# Patient Record
Sex: Female | Born: 1970 | Race: White | Hispanic: No | Marital: Married | Smoking: Former smoker
Health system: Southern US, Community
[De-identification: ages and names within clinical notes are randomized; demographics above are authoritative.]

## PROBLEM LIST (undated history)

## (undated) DIAGNOSIS — C541 Malignant neoplasm of endometrium: Secondary | ICD-10-CM

## (undated) DIAGNOSIS — R001 Bradycardia, unspecified: Secondary | ICD-10-CM

## (undated) DIAGNOSIS — J45909 Unspecified asthma, uncomplicated: Secondary | ICD-10-CM

## (undated) DIAGNOSIS — I480 Paroxysmal atrial fibrillation: Secondary | ICD-10-CM

## (undated) DIAGNOSIS — M1990 Unspecified osteoarthritis, unspecified site: Secondary | ICD-10-CM

## (undated) DIAGNOSIS — R011 Cardiac murmur, unspecified: Secondary | ICD-10-CM

## (undated) DIAGNOSIS — C801 Malignant (primary) neoplasm, unspecified: Secondary | ICD-10-CM

## (undated) DIAGNOSIS — F329 Major depressive disorder, single episode, unspecified: Secondary | ICD-10-CM

## (undated) DIAGNOSIS — F32A Depression, unspecified: Secondary | ICD-10-CM

## (undated) DIAGNOSIS — I10 Essential (primary) hypertension: Secondary | ICD-10-CM

## (undated) DIAGNOSIS — I499 Cardiac arrhythmia, unspecified: Secondary | ICD-10-CM

## (undated) DIAGNOSIS — I350 Nonrheumatic aortic (valve) stenosis: Secondary | ICD-10-CM

## (undated) DIAGNOSIS — Z923 Personal history of irradiation: Secondary | ICD-10-CM

## (undated) DIAGNOSIS — K219 Gastro-esophageal reflux disease without esophagitis: Secondary | ICD-10-CM

## (undated) DIAGNOSIS — E119 Type 2 diabetes mellitus without complications: Secondary | ICD-10-CM

## (undated) DIAGNOSIS — I272 Pulmonary hypertension, unspecified: Secondary | ICD-10-CM

## (undated) DIAGNOSIS — G4730 Sleep apnea, unspecified: Secondary | ICD-10-CM

## (undated) DIAGNOSIS — E7800 Pure hypercholesterolemia, unspecified: Secondary | ICD-10-CM

## (undated) DIAGNOSIS — D649 Anemia, unspecified: Secondary | ICD-10-CM

## (undated) DIAGNOSIS — F419 Anxiety disorder, unspecified: Secondary | ICD-10-CM

## (undated) HISTORY — DX: Pulmonary hypertension, unspecified: I27.20

## (undated) HISTORY — DX: Nonrheumatic aortic (valve) stenosis: I35.0

## (undated) HISTORY — PX: COLONOSCOPY: SHX174

## (undated) HISTORY — PX: TUBAL LIGATION: SHX77

## (undated) HISTORY — DX: Paroxysmal atrial fibrillation: I48.0

---

## 1998-02-28 HISTORY — PX: TUBAL LIGATION: SHX77

## 2004-05-07 ENCOUNTER — Emergency Department: Payer: Self-pay | Admitting: Emergency Medicine

## 2004-06-27 ENCOUNTER — Emergency Department: Payer: Self-pay | Admitting: Emergency Medicine

## 2004-10-14 ENCOUNTER — Other Ambulatory Visit: Payer: Self-pay

## 2004-10-14 ENCOUNTER — Emergency Department: Payer: Self-pay | Admitting: Unknown Physician Specialty

## 2004-10-30 ENCOUNTER — Emergency Department: Payer: Self-pay | Admitting: General Practice

## 2004-11-06 ENCOUNTER — Emergency Department: Payer: Self-pay | Admitting: Emergency Medicine

## 2005-01-26 ENCOUNTER — Emergency Department: Payer: Self-pay | Admitting: Emergency Medicine

## 2005-07-21 ENCOUNTER — Emergency Department: Payer: Self-pay | Admitting: Unknown Physician Specialty

## 2005-08-10 ENCOUNTER — Ambulatory Visit: Payer: Self-pay | Admitting: Family Medicine

## 2005-09-01 ENCOUNTER — Emergency Department: Payer: Self-pay | Admitting: Internal Medicine

## 2006-01-18 ENCOUNTER — Ambulatory Visit: Payer: Self-pay | Admitting: Gastroenterology

## 2006-08-18 ENCOUNTER — Emergency Department: Payer: Self-pay

## 2007-04-21 DIAGNOSIS — I10 Essential (primary) hypertension: Secondary | ICD-10-CM | POA: Insufficient documentation

## 2007-05-14 DIAGNOSIS — J309 Allergic rhinitis, unspecified: Secondary | ICD-10-CM | POA: Insufficient documentation

## 2007-10-26 ENCOUNTER — Other Ambulatory Visit: Payer: Self-pay

## 2007-10-26 ENCOUNTER — Emergency Department: Payer: Self-pay | Admitting: Emergency Medicine

## 2008-09-19 ENCOUNTER — Emergency Department: Payer: Self-pay | Admitting: Emergency Medicine

## 2008-10-09 ENCOUNTER — Ambulatory Visit: Payer: Self-pay | Admitting: Internal Medicine

## 2009-06-30 ENCOUNTER — Emergency Department: Payer: Self-pay | Admitting: Emergency Medicine

## 2011-06-17 DIAGNOSIS — E1121 Type 2 diabetes mellitus with diabetic nephropathy: Secondary | ICD-10-CM | POA: Insufficient documentation

## 2011-06-24 DIAGNOSIS — F39 Unspecified mood [affective] disorder: Secondary | ICD-10-CM | POA: Insufficient documentation

## 2011-08-17 DIAGNOSIS — L209 Atopic dermatitis, unspecified: Secondary | ICD-10-CM | POA: Insufficient documentation

## 2011-12-19 DIAGNOSIS — K219 Gastro-esophageal reflux disease without esophagitis: Secondary | ICD-10-CM | POA: Insufficient documentation

## 2012-04-20 DIAGNOSIS — Z0000 Encounter for general adult medical examination without abnormal findings: Secondary | ICD-10-CM | POA: Insufficient documentation

## 2012-12-08 ENCOUNTER — Ambulatory Visit: Payer: Self-pay | Admitting: Family Medicine

## 2012-12-17 DIAGNOSIS — R0789 Other chest pain: Secondary | ICD-10-CM | POA: Insufficient documentation

## 2013-01-19 ENCOUNTER — Ambulatory Visit: Payer: Self-pay | Admitting: Family Medicine

## 2013-12-09 DIAGNOSIS — E119 Type 2 diabetes mellitus without complications: Secondary | ICD-10-CM | POA: Insufficient documentation

## 2013-12-09 DIAGNOSIS — J45909 Unspecified asthma, uncomplicated: Secondary | ICD-10-CM | POA: Insufficient documentation

## 2013-12-09 DIAGNOSIS — K219 Gastro-esophageal reflux disease without esophagitis: Secondary | ICD-10-CM | POA: Insufficient documentation

## 2013-12-11 DIAGNOSIS — G4733 Obstructive sleep apnea (adult) (pediatric): Secondary | ICD-10-CM | POA: Insufficient documentation

## 2013-12-11 DIAGNOSIS — E782 Mixed hyperlipidemia: Secondary | ICD-10-CM | POA: Insufficient documentation

## 2014-12-02 DIAGNOSIS — I10 Essential (primary) hypertension: Secondary | ICD-10-CM | POA: Insufficient documentation

## 2015-10-13 DIAGNOSIS — Z9851 Tubal ligation status: Secondary | ICD-10-CM | POA: Insufficient documentation

## 2016-10-18 DIAGNOSIS — F439 Reaction to severe stress, unspecified: Secondary | ICD-10-CM | POA: Insufficient documentation

## 2016-12-19 DIAGNOSIS — R0602 Shortness of breath: Secondary | ICD-10-CM | POA: Insufficient documentation

## 2017-07-09 ENCOUNTER — Encounter: Payer: Self-pay | Admitting: Gynecology

## 2017-07-09 ENCOUNTER — Other Ambulatory Visit: Payer: Self-pay

## 2017-07-09 ENCOUNTER — Ambulatory Visit
Admission: EM | Admit: 2017-07-09 | Discharge: 2017-07-09 | Disposition: A | Discharge status: Home / Self Care | Payer: Medicare Other | Attending: Emergency Medicine | Admitting: Emergency Medicine

## 2017-07-09 DIAGNOSIS — R61 Generalized hyperhidrosis: Secondary | ICD-10-CM | POA: Diagnosis not present

## 2017-07-09 DIAGNOSIS — Z0000 Encounter for general adult medical examination without abnormal findings: Secondary | ICD-10-CM

## 2017-07-09 HISTORY — DX: Unspecified asthma, uncomplicated: J45.909

## 2017-07-09 HISTORY — DX: Pure hypercholesterolemia, unspecified: E78.00

## 2017-07-09 HISTORY — DX: Anemia, unspecified: D64.9

## 2017-07-09 HISTORY — DX: Depression, unspecified: F32.A

## 2017-07-09 HISTORY — DX: Gastro-esophageal reflux disease without esophagitis: K21.9

## 2017-07-09 HISTORY — DX: Major depressive disorder, single episode, unspecified: F32.9

## 2017-07-09 HISTORY — DX: Essential (primary) hypertension: I10

## 2017-07-09 HISTORY — DX: Type 2 diabetes mellitus without complications: E11.9

## 2017-07-09 HISTORY — DX: Anxiety disorder, unspecified: F41.9

## 2017-07-09 NOTE — ED Triage Notes (Signed)
Per patient history of high blood pressure. Pt. Stated woke up this morning sweating and concern about her blood pressure. Per patient did not take blood pressure measurement or check for fever at home.

## 2017-07-09 NOTE — ED Provider Notes (Signed)
HPI  SUBJECTIVE:  Joan Graves is a 47 y.o. female who presents with concerns for possible high blood pressure.  Patient states that she woke up this morning diaphoretic and that sheets were "wet".  She was worried that it could be a blood pressure or her diabetes.  She checked her blood sugar and it was 165, which is within normal limits for her.  She has been in her usual state of health up until today.  She has no complaints.  No chest pain, shortness of breath, nausea, abdominal pain, headache, strokelike symptoms.  No lower extremity edema, unintentional weight loss, coughing, wheezing, shortness of breath, anuria, hematuria.  She denies bundling.  She denies any other menopause symptoms.  Family history: Unknown.  She states that her mother died before having menopause.  This is never happened before.  No aggravating or alleviating factors.  She has not tried anything for this.  She has a past medical history of diabetes, hypertension, she does not have a home blood pressure cuff.  She states that she is compliant with her enalapril, amlodipine.  Also hypercholesterolemia, mild aortic stenosis, asthma, anxiety.  History of stroke, MI, kidney disease.  LMP: Started this morning.  She is status post bilateral tubal ligation.  PMD: [facility]   Last 3 blood pressure readings range from 112-140/66-80.    Past Medical History:  Diagnosis Date  . Anemia   . Anxiety   . Asthma   . Depression   . Diabetes mellitus without complication ([place])   . GERD (gastroesophageal reflux disease)   . Hypercholesteremia   . Hypertension     Past Surgical History:  Procedure Laterality Date  . TUBAL LIGATION      No family history on file.  Social History   Tobacco Use  . Smoking status: Never Smoker  . Smokeless tobacco: Never Used  Substance Use Topics  . Alcohol use: Never    Frequency: Never  . Drug use: Never    No current facility-administered medications for  this encounter.   Current Outpatient Medications:  .  ACCU-CHEK SOFTCLIX LANCETS lancets, CHECK GLUCOSE TWICE DIALY FOR GLUCOSE MONITORING E11.9, Disp: , Rfl: 5 .  ADVAIR DISKUS 500-50 MCG/DOSE AEPB, 1 DOSE INHALED TWICE DAILY FOR ASTHMA, RINSE MOUTH OUT WITH WATER AFTE USE, Disp: , Rfl: 5 .  albuterol (PROVENTIL HFA) 108 (90 Base) MCG/ACT inhaler, Inhale into the lungs., Disp: , Rfl:  .  amLODipine (NORVASC) 10 MG tablet, Take by mouth., Disp: , Rfl:  .  atorvastatin (LIPITOR) 20 MG tablet, Take by mouth., Disp: , Rfl:  .  busPIRone (BUSPAR) 10 MG tablet, TAKE 1 PO DAILY TWICE A DAY, Disp: , Rfl:  .  enalapril (VASOTEC) 20 MG tablet, Take by mouth., Disp: , Rfl:  .  ferrous sulfate 325 (65 FE) MG EC tablet, 2 tabletsBY MOUTH  A DAY FOR ANEMIA, Disp: , Rfl:  .  glipiZIDE (GLUCOTROL XL) 10 MG 24 hr tablet, Take only tablet by mouth daily for diabetes, Disp: , Rfl:  .  glucose blood test strip, USE AS DIRECTED TWICE A DAY, Disp: , Rfl:  .  metFORMIN (GLUCOPHAGE-XR) 500 MG 24 hr tablet, 2 BY MOUTH TWICE DAILY FOR DIABETES, Disp: , Rfl: 1 .  omeprazole (PRILOSEC) 40 MG capsule, Take by mouth., Disp: , Rfl:  .  sertraline (ZOLOFT) 50 MG tablet, Take by mouth., Disp: , Rfl:  .  clotrimazole (LOTRIMIN) 1 % cream, APPLY TO AFFECTED AREA  TWICE A DAY FOR FUNGAL INFECTION, Disp: , Rfl: 0 .  escitalopram (LEXAPRO) 5 MG tablet, TAKE 1 TABLET BY MOUTH EVERY DAY IN THE MORNING, Disp: , Rfl: 0  Allergies  Allergen Reactions  . Aspirin Diarrhea     ROS  As noted in HPI.   Physical Exam  BP (!) 143/74 (BP Location: Left Arm)   Pulse 67   Temp 98 F (36.7 C) (Oral)   Resp 18   Ht 5\' 4"  (1.626 m)   Wt (!) 370 lb (167.8 kg)   LMP 07/09/2017   SpO2 98%   BMI 63.51 kg/m   Constitutional: Well developed, well nourished, no acute distress Eyes:  EOMI, conjunctiva normal bilaterally HENT: Normocephalic, atraumatic,mucus membranes moist Respiratory: Normal inspiratory effort good air movement,  lungs clear bilaterally Cardiovascular: Normal rate and rhythm, positive murmur, no rub no gallop GI: nondistended soft.  Active bowel sounds.  No suprapubic or flank tenderness  Back: No CVA tenderness skin: No rash, skin intact Musculoskeletal: no deformities  trace edema bilateral lower extremities calves symmetric. Neurologic: Alert & oriented x 3, no focal neuro deficits Psychiatric: Speech and behavior appropriate   ED Course   Medications - No data to display  No orders of the defined types were placed in this encounter.   No results found for this or any previous visit (from the past 24 hour(s)). No results found.  ED Clinical Impression  Normal exam   ED Assessment/Plan  Outside medical records reviewed.  As noted in HPI.  Blood pressure acceptable today.  Patient states that her sugar was within normal limits at home today.  Unsure as the etiology of patient's symptoms, but do not think that it is an emergency right now.  Since this was a single episode this morning, will have patient keep an eye on this.  If it starts happening on a regular basis, she will need to follow-up with her primary care physician for further investigation.  Also advised patient to buy a blood pressure cuff and keep a log of it and follow-up with her primary care physician with this.  This could be the beginnings of menopause.  We will write a work note for today.  Discussed  MDM, treatment plan, and plan for follow-up with patient. patient agrees with plan.   No orders of the defined types were placed in this encounter.   *This clinic note was created using Dragon dictation software. Therefore, there may be occasional mistakes despite careful proofreading.   ?   Melynda Ripple, MD 07/09/17 1003

## 2017-07-09 NOTE — Discharge Instructions (Addendum)
It is important to keep your blood pressure under good control, as having a elevated blood pressure for prolonged periods of time significantly increases your risk of stroke, heart attacks, kidney damage, eye damage, and other problems. Measure your blood pressure once a day, preferably at the same time every day. Keep a log of this and bring it to your next doctor's appointment.  Bring your blood pressure cuff as well so that they can make sure that it is taking accurate readings.  Return immediately to the ER if you start having chest pain, headache, problems seeing, problems talking, problems walking, if you feel like you're about to pass out, if you do pass out, if you have a seizure, or for any other concerns.  Go to www.goodrx.com to look up your medications. This will give you a list of where you can find your prescriptions at the most affordable prices. Or ask the pharmacist what the cash price is, or if they have any other discount programs available to help make your medication more affordable. This can be less expensive than what you would pay with insurance.

## 2017-11-07 ENCOUNTER — Encounter: Payer: Self-pay | Admitting: Emergency Medicine

## 2017-11-07 ENCOUNTER — Ambulatory Visit
Admission: EM | Admit: 2017-11-07 | Discharge: 2017-11-07 | Disposition: A | Discharge status: Home / Self Care | Payer: Medicare Other | Attending: Family Medicine | Admitting: Family Medicine

## 2017-11-07 ENCOUNTER — Other Ambulatory Visit: Payer: Self-pay

## 2017-11-07 DIAGNOSIS — E119 Type 2 diabetes mellitus without complications: Secondary | ICD-10-CM | POA: Diagnosis not present

## 2017-11-07 DIAGNOSIS — K219 Gastro-esophageal reflux disease without esophagitis: Secondary | ICD-10-CM | POA: Diagnosis not present

## 2017-11-07 DIAGNOSIS — J45909 Unspecified asthma, uncomplicated: Secondary | ICD-10-CM | POA: Insufficient documentation

## 2017-11-07 DIAGNOSIS — B9789 Other viral agents as the cause of diseases classified elsewhere: Secondary | ICD-10-CM | POA: Diagnosis not present

## 2017-11-07 DIAGNOSIS — Z7984 Long term (current) use of oral hypoglycemic drugs: Secondary | ICD-10-CM | POA: Diagnosis not present

## 2017-11-07 DIAGNOSIS — J028 Acute pharyngitis due to other specified organisms: Secondary | ICD-10-CM | POA: Insufficient documentation

## 2017-11-07 DIAGNOSIS — F419 Anxiety disorder, unspecified: Secondary | ICD-10-CM | POA: Insufficient documentation

## 2017-11-07 DIAGNOSIS — E7800 Pure hypercholesterolemia, unspecified: Secondary | ICD-10-CM | POA: Diagnosis not present

## 2017-11-07 DIAGNOSIS — I10 Essential (primary) hypertension: Secondary | ICD-10-CM | POA: Insufficient documentation

## 2017-11-07 DIAGNOSIS — Z79899 Other long term (current) drug therapy: Secondary | ICD-10-CM | POA: Insufficient documentation

## 2017-11-07 DIAGNOSIS — F329 Major depressive disorder, single episode, unspecified: Secondary | ICD-10-CM | POA: Insufficient documentation

## 2017-11-07 DIAGNOSIS — Z886 Allergy status to analgesic agent status: Secondary | ICD-10-CM | POA: Diagnosis not present

## 2017-11-07 DIAGNOSIS — J029 Acute pharyngitis, unspecified: Secondary | ICD-10-CM | POA: Diagnosis present

## 2017-11-07 HISTORY — DX: Sleep apnea, unspecified: G47.30

## 2017-11-07 LAB — RAPID STREP SCREEN (MED CTR MEBANE ONLY): Streptococcus, Group A Screen (Direct): NEGATIVE

## 2017-11-07 MED ORDER — LIDOCAINE VISCOUS HCL 2 % MT SOLN: OROMUCOSAL | 0 refills | Status: DC

## 2017-11-07 MED ORDER — FLUTICASONE PROPIONATE 50 MCG/ACT NA SUSP: 2.0000 | Freq: Every day | NASAL | 0 refills | Status: DC

## 2017-11-07 NOTE — ED Triage Notes (Signed)
Patient c/o sore throat that started 1 week ago. Patient stated she has tried OTC cough medicine with no relief. Denies fever.

## 2017-11-07 NOTE — ED Provider Notes (Signed)
[facility]    CSN: [id number] Arrival date & time: 11/07/17  1054   History   Chief Complaint Chief Complaint  Patient presents with  . Sore Throat   HPI   47 year old female presents with sore throat.  Patient reports a one-week history of sore throat.  Mild cough.  No fever.  No chills.  No reported sick contacts.  She reports that she is taken over-the-counter cough medicine and "headache medicine" without resolution.  Sore throat exacerbated by swallowing.  No relieving factors.  Moderate in severity.  No other associated symptoms.  No other complaints.  PMH, Surgical Hx, Social History reviewed and updated as below. Past Medical History:  Diagnosis Date  . Anemia   . Anxiety   . Asthma   . Depression   . Diabetes mellitus without complication ([place])   . GERD (gastroesophageal reflux disease)   . Hypercholesteremia   . Hypertension   . Sleep apnea    Past Surgical History:  Procedure Laterality Date  . TUBAL LIGATION      OB History    Gravida  1   Para      Term      Preterm      AB      Living        SAB      TAB      Ectopic      Multiple      Live Births               Home Medications    Prior to Admission medications   Medication Sig Start Date End Date Taking? Authorizing Provider  ACCU-CHEK SOFTCLIX LANCETS lancets CHECK GLUCOSE TWICE DIALY FOR GLUCOSE MONITORING E11.9 06/09/17  Yes [provider]  ADVAIR DISKUS 500-50 MCG/DOSE AEPB 1 DOSE INHALED TWICE DAILY FOR ASTHMA, RINSE MOUTH OUT WITH WATER AFTE USE 04/24/17  Yes [provider]  albuterol (PROVENTIL HFA) 108 (90 Base) MCG/ACT inhaler Inhale into the lungs.   Yes [provider]  amLODipine (NORVASC) 10 MG tablet Take by mouth. 05/06/15  Yes [provider]  atorvastatin (LIPITOR) 20 MG tablet Take by mouth. 06/01/17  Yes [provider]  busPIRone (BUSPAR) 10 MG tablet TAKE 1 PO DAILY TWICE A DAY 04/09/15  Yes [provider]  clotrimazole (LOTRIMIN) 1 % cream APPLY TO AFFECTED AREA TWICE A DAY FOR FUNGAL INFECTION 06/14/17  Yes [provider]  enalapril (VASOTEC) 20 MG tablet Take by mouth. 06/01/17  Yes [provider]  escitalopram (LEXAPRO) 5 MG tablet TAKE 1 TABLET BY MOUTH EVERY DAY IN THE MORNING 07/01/17  Yes [provider]  ferrous sulfate 325 (65 FE) MG EC tablet 2 tabletsBY MOUTH  A DAY FOR ANEMIA 11/30/15  Yes [provider]  glucose blood test strip USE AS DIRECTED TWICE A DAY 07/08/15  Yes [provider]  metFORMIN (GLUCOPHAGE-XR) 500 MG 24 hr tablet 2 BY MOUTH TWICE DAILY FOR DIABETES 05/26/17  Yes [provider]  omeprazole (PRILOSEC) 40 MG capsule Take by mouth. 12/06/15  Yes [provider]  sertraline (ZOLOFT) 50 MG tablet Take by mouth. 10/02/15  Yes [provider]  fluticasone (FLONASE) 50 MCG/ACT nasal spray Place 2 sprays into both nostrils daily. 11/07/17   Coral Spikes, DO  glipiZIDE (GLUCOTROL XL) 10 MG 24 hr tablet Take only tablet by mouth daily for diabetes 08/14/15   [provider]  lidocaine (XYLOCAINE) 2 % solution Gargle  15 mL every 3 hours as needed. May swallow if desired. 11/07/17   Coral Spikes, DO   Social History Social History   Tobacco Use  . Smoking status: Never Smoker  . Smokeless tobacco: Never Used  Substance Use Topics  . Alcohol use: Never    Frequency: Never  . Drug use: Never     Allergies   Aspirin   Review of Systems Review of Systems  Constitutional: Negative for fever.  HENT: Positive for sore throat.   Respiratory: Positive for cough.    Physical Exam Triage Vital Signs ED Triage Vitals  Enc Vitals Group     BP 11/07/17 1117 (!) 149/85     Pulse Rate 11/07/17 1117 76     Resp 11/07/17 1117 18     Temp 11/07/17 1117 98.3 F (36.8 C)     Temp Source 11/07/17 1117 Oral     SpO2 11/07/17 1117 99 %     Weight 11/07/17 1113 (!) 367 lb (166.5 kg)      Height 11/07/17 1113 5\' 4"  (1.626 m)     Head Circumference --      Peak Flow --      Pain Score 11/07/17 1113 3     Pain Loc --      Pain Edu? --      Excl. in [place]? --    Updated Vital Signs BP (!) 149/85 (BP Location: Right Arm)   Pulse 76   Temp 98.3 F (36.8 C) (Oral)   Resp 18   Ht 5\' 4"  (1.626 m)   Wt (!) 166.5 kg   LMP 10/06/2017 (Exact Date)   SpO2 99%   Breastfeeding? No   BMI 63.00 kg/m   Visual Acuity Right Eye Distance:   Left Eye Distance:   Bilateral Distance:    Right Eye Near:   Left Eye Near:    Bilateral Near:     Physical Exam  Constitutional: She appears well-developed. No distress.  HENT:  Head: Normocephalic and atraumatic.  Mouth/Throat: Oropharynx is clear and moist.  Neck: Neck supple.  Cardiovascular: Normal rate and regular rhythm.  Murmur heard. Pulmonary/Chest: Effort normal and breath sounds normal. She has no wheezes. She has no rales.  Lymphadenopathy:    She has no cervical adenopathy.  Neurological: She is alert.  Psychiatric:  Flat affect, depressed mood.  Nursing note and vitals reviewed.  UC Treatments / Results  Labs (all labs ordered are listed, but only abnormal results are displayed) Labs Reviewed  RAPID STREP SCREEN ([facility] ONLY)  CULTURE, GROUP A STREP [facility])    EKG None  Radiology No results found.  Procedures Procedures (including critical care time)  Medications Ordered in UC Medications - No data to display  Initial Impression / Assessment and Plan / UC Course  I have reviewed the triage vital signs and the nursing notes.  Pertinent labs & imaging results that were available during my care of the patient were reviewed by me and considered in my medical decision making (see chart for details).    47 year old female presents with viral pharyngitis.  Treating with viscous lidocaine and Flonase.  Final Clinical Impressions(s) / UC Diagnoses   Final diagnoses:  Viral pharyngitis      Discharge Instructions     Medications as prescribed.  Take care  Dr. Lacinda Graves    ED Prescriptions    Medication Sig Dispense Auth. Provider   lidocaine (XYLOCAINE) 2 % solution Gargle 15 mL every  3 hours as needed. May swallow if desired. 200 mL Marston Mccadden G, DO   fluticasone (FLONASE) 50 MCG/ACT nasal spray Place 2 sprays into both nostrils daily. 16 g Coral Spikes, DO     Controlled Substance Prescriptions [place] Controlled Substance Registry consulted? Not Applicable   Coral Spikes, DO 11/07/17 1206

## 2017-11-07 NOTE — Discharge Instructions (Signed)
Medications as prescribed. ° °Take care ° °Dr. Vylet Maffia  °

## 2017-11-09 ENCOUNTER — Telehealth (HOSPITAL_COMMUNITY): Payer: Self-pay

## 2017-11-09 LAB — CULTURE, GROUP A STREP (THRC)

## 2017-11-09 NOTE — Telephone Encounter (Signed)
Culture is positive for non group A Strep germ.  This is a finding of uncertain significance; not the typical 'strep throat' germ.  Attempted to reach patient. No answer at this time.  

## 2017-11-28 ENCOUNTER — Other Ambulatory Visit: Payer: Self-pay | Admitting: Family Medicine

## 2017-12-10 ENCOUNTER — Other Ambulatory Visit: Payer: Self-pay | Admitting: Family Medicine

## 2018-03-06 DIAGNOSIS — F09 Unspecified mental disorder due to known physiological condition: Secondary | ICD-10-CM | POA: Insufficient documentation

## 2019-01-09 DIAGNOSIS — R32 Unspecified urinary incontinence: Secondary | ICD-10-CM | POA: Insufficient documentation

## 2020-05-22 ENCOUNTER — Other Ambulatory Visit: Payer: Self-pay | Admitting: Physician Assistant

## 2020-05-22 DIAGNOSIS — R634 Abnormal weight loss: Secondary | ICD-10-CM | POA: Insufficient documentation

## 2020-05-22 DIAGNOSIS — Z1231 Encounter for screening mammogram for malignant neoplasm of breast: Secondary | ICD-10-CM

## 2020-06-11 DIAGNOSIS — I272 Pulmonary hypertension, unspecified: Secondary | ICD-10-CM | POA: Insufficient documentation

## 2020-06-11 DIAGNOSIS — I350 Nonrheumatic aortic (valve) stenosis: Secondary | ICD-10-CM | POA: Insufficient documentation

## 2020-06-12 ENCOUNTER — Other Ambulatory Visit: Payer: Self-pay

## 2020-06-12 ENCOUNTER — Ambulatory Visit
Admission: RE | Admit: 2020-06-12 | Discharge: 2020-06-12 | Disposition: A | Discharge status: Home / Self Care | Payer: Medicare Other | Source: Ambulatory Visit | Attending: Physician Assistant | Admitting: Physician Assistant

## 2020-06-12 DIAGNOSIS — Z1231 Encounter for screening mammogram for malignant neoplasm of breast: Secondary | ICD-10-CM | POA: Insufficient documentation

## 2020-08-16 ENCOUNTER — Ambulatory Visit
Admission: EM | Admit: 2020-08-16 | Discharge: 2020-08-16 | Disposition: A | Discharge status: Home / Self Care | Payer: Medicare Other

## 2020-08-16 ENCOUNTER — Other Ambulatory Visit: Payer: Self-pay

## 2020-08-16 DIAGNOSIS — M25562 Pain in left knee: Secondary | ICD-10-CM | POA: Diagnosis not present

## 2020-08-16 MED ORDER — HYDROCODONE-ACETAMINOPHEN 5-325 MG PO TABS: 1.0000 | ORAL_TABLET | Freq: Four times a day (QID) | ORAL | 0 refills | Status: DC | PRN

## 2020-08-16 NOTE — ED Triage Notes (Signed)
Pt c/o left knee pain for over a month. Pt states the knee is painful, swollen and tight feeling. Pt has taken Aleve with no improvement. Pt is scheduled to see ortho tomorrow. Pt states she does have arthritis in the knee, but her PCP has not treated it.

## 2020-08-16 NOTE — ED Provider Notes (Signed)
[facility]    CSN: [id number] Arrival date & time: 08/16/20  0946      History   Chief Complaint Chief Complaint  Patient presents with   Knee Pain    Left    HPI ANALEA MULLER is a 50 y.o. female.   HPI  50 year old female here for evaluation of left knee pain.  Been experiencing pain in her left knee for over a month.  She states that the knee is swollen and feels tight in the back of her knee.  She has been taking Aleve over-the-counter with no improvement of her pain.  She is scheduled to see orthopedics tomorrow 130 the afternoon.  She is here today mostly because of the increase in pain and the fact that she had to call out from work due to the pain.  Past Medical History:  Diagnosis Date   Anemia    Anxiety    Asthma    Depression    Diabetes mellitus without complication ([facility])    GERD (gastroesophageal reflux disease)    Hypercholesteremia    Hypertension    Sleep apnea     There are no problems to display for this patient.   Past Surgical History:  Procedure Laterality Date   TUBAL LIGATION      OB History     Gravida  1   Para      Term      Preterm      AB      Living         SAB      IAB      Ectopic      Multiple      Live Births               Home Medications    Prior to Admission medications   Medication Sig Start Date End Date Taking? Authorizing Provider  ADVAIR DISKUS 500-50 MCG/DOSE AEPB 1 DOSE INHALED TWICE DAILY FOR ASTHMA, RINSE MOUTH OUT WITH WATER AFTE USE 04/24/17  Yes [provider]  albuterol (VENTOLIN HFA) 108 (90 Base) MCG/ACT inhaler Inhale into the lungs.   Yes [provider]  amLODipine (NORVASC) 10 MG tablet Take 1 tablet by mouth daily. 10/17/18  Yes [provider]  atorvastatin (LIPITOR) 20 MG tablet Take 1 tablet by mouth daily. 10/04/19  Yes [provider]  buPROPion (WELLBUTRIN SR) 100 MG 12 hr tablet Take 100 mg by mouth 2 (two) times daily.  08/10/20  Yes [provider]  enalapril (VASOTEC) 20 MG tablet Take 2 tablets by mouth daily. 02/13/20  Yes [provider]  ferrous sulfate 325 (65 FE) MG EC tablet 2 tabletsBY MOUTH  A DAY FOR ANEMIA 11/30/15  Yes [provider]  fluticasone (FLONASE) 50 MCG/ACT nasal spray Place 2 sprays into both nostrils daily. 11/07/17  Yes Cook, Jayce G, DO  glipiZIDE (GLUCOTROL XL) 10 MG 24 hr tablet Take only tablet by mouth daily for diabetes 08/14/15  Yes [provider]  hydrochlorothiazide (MICROZIDE) 12.5 MG capsule Take 12.5 mg by mouth daily. 07/16/20  Yes [provider]  HYDROcodone-acetaminophen (NORCO) 5-325 MG tablet Take 1 tablet by mouth every 6 (six) hours as needed for moderate pain. 08/16/20  Yes Margarette Canada, NP  Insulin Glargine (BASAGLAR KWIKPEN) 100 UNIT/ML SMARTSIG:65 Unit(s) SUB-Q Every Night 07/16/20  Yes [provider]  JARDIANCE 25 MG TABS tablet Take 25 mg by mouth daily. 07/16/20  Yes [provider]  metFORMIN (GLUCOPHAGE-XR) 500 MG 24 hr tablet 2 BY MOUTH TWICE DAILY FOR DIABETES 05/26/17  Yes [provider]  omeprazole (PRILOSEC) 40 MG capsule Take by mouth. 12/06/15  Yes [provider]  oxybutynin (DITROPAN-XL) 5 MG 24 hr tablet Take 5 mg by mouth daily. 08/09/20  Yes [provider]  ACCU-CHEK SOFTCLIX LANCETS lancets CHECK GLUCOSE TWICE DIALY FOR GLUCOSE MONITORING E11.9 06/09/17   [provider]  clotrimazole (LOTRIMIN) 1 % cream APPLY TO AFFECTED AREA TWICE A DAY FOR FUNGAL INFECTION 06/14/17   [provider]  glucose blood test strip USE AS DIRECTED TWICE A DAY 07/08/15   [provider]  lidocaine (XYLOCAINE) 2 % solution Gargle 15 mL every 3 hours as needed. May swallow if desired. 11/07/17   Coral Spikes, DO    Family History Family History  Problem Relation Age of Onset   Breast cancer Cousin     Social History Social History   Tobacco Use    Smoking status: Never   Smokeless tobacco: Never  Vaping Use   Vaping Use: Never used  Substance Use Topics   Alcohol use: Never   Drug use: Never     Allergies   Aspirin   Review of Systems Review of Systems  Constitutional:  Negative for activity change, appetite change and fever.  Musculoskeletal:  Positive for arthralgias, joint swelling and myalgias.    Physical Exam Triage Vital Signs ED Triage Vitals  Enc Vitals Group     BP 08/16/20 1005 116/67     Pulse Rate 08/16/20 1005 67     Resp 08/16/20 1005 18     Temp 08/16/20 1005 98.2 F (36.8 C)     Temp Source 08/16/20 1005 Oral     SpO2 08/16/20 1005 99 %     Weight 08/16/20 1000 (!) 331 lb (150.1 kg)     Height 08/16/20 1000 5\' 3"  (1.6 m)     Head Circumference --      Peak Flow --      Pain Score 08/16/20 0959 10     Pain Loc --      Pain Edu? --      Excl. in [place]? --    No data found.  Updated Vital Signs BP 116/67 (BP Location: Left Arm)   Pulse 67   Temp 98.2 F (36.8 C) (Oral)   Resp 18   Ht 5\' 3"  (1.6 m)   Wt (!) 331 lb (150.1 kg)   SpO2 99%   BMI 58.63 kg/m   Visual Acuity Right Eye Distance:   Left Eye Distance:   Bilateral Distance:    Right Eye Near:   Left Eye Near:    Bilateral Near:     Physical Exam Vitals and nursing note reviewed.  Constitutional:      General: She is not in acute distress.    Appearance: Normal appearance. She is obese. She is not ill-appearing.  HENT:     Head: Normocephalic and atraumatic.  Cardiovascular:     Rate and Rhythm: Normal rate and regular rhythm.     Pulses: Normal pulses.     Heart sounds: Normal heart sounds. No murmur heard.   No gallop.  Pulmonary:     Effort: Pulmonary effort is normal.     Breath sounds: Normal breath sounds. No wheezing, rhonchi or rales.  Musculoskeletal:        General: Tenderness present. No deformity. Normal range of motion.  Skin:  General: Skin is warm and dry.     Capillary Refill: Capillary refill  takes less than 2 seconds.  Neurological:     General: No focal deficit present.     Mental Status: She is alert and oriented to person, place, and time.  Psychiatric:        Mood and Affect: Mood normal.        Behavior: Behavior normal.        Thought Content: Thought content normal.        Judgment: Judgment normal.     UC Treatments / Results  Labs (all labs ordered are listed, but only abnormal results are displayed) Labs Reviewed - No data to display  EKG   Radiology No results found.  Procedures Procedures (including critical care time)  Medications Ordered in UC Medications - No data to display  Initial Impression / Assessment and Plan / UC Course  I have reviewed the triage vital signs and the nursing notes.  Pertinent labs & imaging results that were available during my care of the patient were reviewed by me and considered in my medical decision making (see chart for details).  Patient is a nontoxic-appearing, obese 50 year old female here for evaluation of left knee pain.  She has seen her PCP about the left knee pain and is also had a radiograph obtained of her left knee that shows bone-on-bone in the medial compartment as well as multiple osteophyte development.  [facility] so I am unable to see those notes.  She is scheduled to see orthopedics at [facility] tomorrow at 1:30 in the afternoon.  Patient physical exam reveals a knee that is in normal anatomical alignment as best I can tell.  She has very large thighs.  There is no crepitus with passive range of motion of her knee but she does complain of pain when she comes to full extension.  There is no pain with varus stress but she does complain of pain with valgus stress application.  She also has tenderness in the popliteals base and in the hamstrings.  Her muscles are very tight in her hamstring and quadricep complex.  There is no tenderness with palpation of the patella or  with the medial or lateral joint lines.  Patient is set up to see orthopedics for definitive treatment.  We will give patient work note and cover her for pain with Norco tonight.  Patient does use a cane for ambulation.   Final Clinical Impressions(s) / UC Diagnoses   Final diagnoses:  Acute pain of left knee   Discharge Instructions   None    ED Prescriptions     Medication Sig Dispense Auth. Provider   HYDROcodone-acetaminophen (NORCO) 5-325 MG tablet Take 1 tablet by mouth every 6 (six) hours as needed for moderate pain. 10 tablet Margarette Canada, NP      I have reviewed the PDMP during this encounter.   Margarette Canada, NP 08/16/20 1029

## 2020-08-16 NOTE — Discharge Instructions (Addendum)
Keep your appointment tomorrow with orthopedics as previously scheduled as they will be the source of definitive treatment of your left knee pain.  Keep your left knee elevated is much as possible to help decrease swelling and aid in pain relief.  Take over-the-counter Tylenol and Aleve as needed for mild to moderate pain and use the Norco as needed for severe pain.  The Norco may make you drowsy so do not drink alcohol or drive if you take it, and do not operate heavy machinery.

## 2020-10-11 ENCOUNTER — Other Ambulatory Visit: Payer: Self-pay

## 2020-10-11 ENCOUNTER — Ambulatory Visit
Admission: EM | Admit: 2020-10-11 | Discharge: 2020-10-11 | Disposition: A | Discharge status: Home / Self Care | Payer: Medicare Other | Attending: Emergency Medicine | Admitting: Emergency Medicine

## 2020-10-11 DIAGNOSIS — L03012 Cellulitis of left finger: Secondary | ICD-10-CM

## 2020-10-11 MED ORDER — DOXYCYCLINE HYCLATE 100 MG PO CAPS: 100.0000 mg | ORAL_CAPSULE | Freq: Two times a day (BID) | ORAL | 0 refills | Status: DC

## 2020-10-11 NOTE — ED Provider Notes (Signed)
[facility]    CSN: [id number] Arrival date & time: 10/11/20  [id number]      History   Chief Complaint Chief Complaint  Patient presents with   Sore    Left thumb    HPI Joan Graves is a 50 y.o. female.   HPI  50 year old female here for evaluation of blister on left thumb.  Patient reports that she noticed a blister on her left thumb 2 days ago.  She states that when she was getting a shower she hit her thumb and broke the blister.  There is a tan pus draining from the wound currently.  Patient was reports that she is had some clear drainage from the wound.  She states that it is painful.  She is unaware of any injury or insect bite.  She does work at [employer] and she is unsure if she might of come in contact with something at work.  She has not had a fever, red streaks going up her finger, or numbness or tingling of her thumb.  She states that it is difficult to flex due to the swelling of her thumb.  Past Medical History:  Diagnosis Date   Anemia    Anxiety    Asthma    Depression    Diabetes mellitus without complication (HCC)    GERD (gastroesophageal reflux disease)    Hypercholesteremia    Hypertension    Sleep apnea     There are no problems to display for this patient.   Past Surgical History:  Procedure Laterality Date   TUBAL LIGATION      OB History     Gravida  1   Para      Term      Preterm      AB      Living         SAB      IAB      Ectopic      Multiple      Live Births               Home Medications    Prior to Admission medications   Medication Sig Start Date End Date Taking? Authorizing Provider  ACCU-CHEK SOFTCLIX LANCETS lancets CHECK GLUCOSE TWICE DIALY FOR GLUCOSE MONITORING E11.9 06/09/17  Yes [provider]  ADVAIR DISKUS 500-50 MCG/DOSE AEPB 1 DOSE INHALED TWICE DAILY FOR ASTHMA, RINSE MOUTH OUT WITH WATER AFTE USE 04/24/17  Yes [provider]  albuterol (VENTOLIN HFA) 108  (90 Base) MCG/ACT inhaler Inhale into the lungs.   Yes [provider]  amLODipine (NORVASC) 10 MG tablet Take 1 tablet by mouth daily. 10/17/18  Yes [provider]  atorvastatin (LIPITOR) 20 MG tablet Take 1 tablet by mouth daily. 10/04/19  Yes [provider]  buPROPion (WELLBUTRIN SR) 100 MG 12 hr tablet Take 100 mg by mouth 2 (two) times daily. 08/10/20  Yes [provider]  clotrimazole (LOTRIMIN) 1 % cream APPLY TO AFFECTED AREA TWICE A DAY FOR FUNGAL INFECTION 06/14/17  Yes [provider]  doxycycline (VIBRAMYCIN) 100 MG capsule Take 1 capsule (100 mg total) by mouth 2 (two) times daily. 10/11/20  Yes Margarette Canada, NP  enalapril (VASOTEC) 20 MG tablet Take 2 tablets by mouth daily. 02/13/20  Yes [provider]  ferrous sulfate 325 (65 FE) MG EC tablet 2 tabletsBY MOUTH  A DAY FOR ANEMIA 11/30/15  Yes [provider]  glipiZIDE (GLUCOTROL XL) 10  MG 24 hr tablet Take only tablet by mouth daily for diabetes 08/14/15  Yes [provider]  glucose blood test strip USE AS DIRECTED TWICE A DAY 07/08/15  Yes [provider]  hydrochlorothiazide (MICROZIDE) 12.5 MG capsule Take 12.5 mg by mouth daily. 07/16/20  Yes [provider]  Insulin Glargine (BASAGLAR KWIKPEN) 100 UNIT/ML SMARTSIG:65 Unit(s) SUB-Q Every Night 07/16/20  Yes [provider]  JARDIANCE 25 MG TABS tablet Take 25 mg by mouth daily. 07/16/20  Yes [provider]  metFORMIN (GLUCOPHAGE-XR) 500 MG 24 hr tablet 2 BY MOUTH TWICE DAILY FOR DIABETES 05/26/17  Yes [provider]  omeprazole (PRILOSEC) 40 MG capsule Take by mouth. 12/06/15  Yes [provider]  oxybutynin (DITROPAN-XL) 5 MG 24 hr tablet Take 5 mg by mouth daily. 08/09/20  Yes [provider]    Family History Family History  Problem Relation Age of Onset   Breast cancer Cousin     Social History Social History   Tobacco Use   Smoking status:  Never   Smokeless tobacco: Never  Vaping Use   Vaping Use: Never used  Substance Use Topics   Alcohol use: Never   Drug use: Never     Allergies   Aspirin   Review of Systems Review of Systems  Constitutional:  Negative for activity change, appetite change and fever.  Musculoskeletal:  Positive for myalgias.  Skin:  Positive for color change and wound.  Neurological:  Negative for weakness and numbness.  Hematological: Negative.   Psychiatric/Behavioral: Negative.      Physical Exam Triage Vital Signs ED Triage Vitals  Enc Vitals Group     BP 10/11/20 0954 111/62     Pulse Rate 10/11/20 0954 86     Resp 10/11/20 0954 18     Temp 10/11/20 0954 98 F (36.7 C)     Temp Source 10/11/20 0954 Oral     SpO2 10/11/20 0954 100 %     Weight 10/11/20 0951 (!) 329 lb (149.2 kg)     Height 10/11/20 0951 '5\' 3"'$  (1.6 m)     Head Circumference --      Peak Flow --      Pain Score 10/11/20 0951 8     Pain Loc --      Pain Edu? --      Excl. in [place]? --    No data found.  Updated Vital Signs BP 111/62 (BP Location: Left Arm)   Pulse 86   Temp 98 F (36.7 C) (Oral)   Resp 18   Ht '5\' 3"'$  (1.6 m)   Wt (!) 329 lb (149.2 kg)   SpO2 100%   BMI 58.28 kg/m   Visual Acuity Right Eye Distance:   Left Eye Distance:   Bilateral Distance:    Right Eye Near:   Left Eye Near:    Bilateral Near:     Physical Exam Vitals and nursing note reviewed.  Constitutional:      General: She is not in acute distress.    Appearance: Normal appearance. She is obese. She is not ill-appearing.  HENT:     Head: Normocephalic and atraumatic.  Skin:    General: Skin is warm and dry.     Capillary Refill: Capillary refill takes less than 2 seconds.     Findings: Erythema and lesion present.  Neurological:     General: No focal deficit present.     Mental Status: She is alert and oriented  to person, place, and time.     Sensory: No sensory deficit.     Motor: No weakness.  Psychiatric:         Mood and Affect: Mood normal.        Behavior: Behavior normal.        Thought Content: Thought content normal.        Judgment: Judgment normal.     UC Treatments / Results  Labs (all labs ordered are listed, but only abnormal results are displayed) Labs Reviewed  AEROBIC CULTURE W GRAM STAIN (SUPERFICIAL SPECIMEN)    EKG   Radiology No results found.  Procedures Procedures (including critical care time)  Medications Ordered in UC Medications - No data to display  Initial Impression / Assessment and Plan / UC Course  I have reviewed the triage vital signs and the nursing notes.  Pertinent labs & imaging results that were available during my care of the patient were reviewed by me and considered in my medical decision making (see chart for details).  Patient is a nontoxic-appearing 50 year old female here for evaluation of a draining lesion on her left thumb.  Patient presents with a 1 cm circular erythematous lesion that is flat on the medial aspect of the volar thumb surface of the left hand.  There is a central tissue opening that I am able to express tan pus through with lateral pressure application.  This pus was sampled for culture and will be sent to lab.  Following the pus expression there was some serosanguineous fluid that emanated from the wound.  There is mild swelling to the proximal phalanx of the left thumb the patient has full sensation and mostly full range of motion.  She does have slight decreased flexion secondary to the swelling.  There are no red streaks ascending the arm the patient is not had a fever.  Patient does have scabbed lesions on her left forearm and indicates that she is a "skin condition" that she has had since birth.  She is unsure if it is eczema.  There are no skin conditions listed in her past medical history.  We will treat patient for cellulitis of the left thumb with doxycycline twice daily for 10 days.  Bandage was applied to the wound to  contain the drainage.  Patient advised to keep a Band-Aid in place until a scab has formed and then she can leave the lesion open to air when she is at home and keep it covered with a Band-Aid while she is at work.   Final Clinical Impressions(s) / UC Diagnoses   Final diagnoses:  Cellulitis of thumb, left     Discharge Instructions      Take the Doxycycline twice daily with food for 10 days.  Doxycycline will make you more sensitive to sunburn so wear sunscreen when outdoors and reapply it every 90 minutes.  Apply warm compresses to help promote drainage.  Use OTC Tylenol and Ibuprofen according to the package instructions as needed for pain.  Return for new or worsening symptoms.  This includes increase in swelling, redness, drainage, pain, Richardson the arm, or fever.     ED Prescriptions     Medication Sig Dispense Auth. Provider   doxycycline (VIBRAMYCIN) 100 MG capsule Take 1 capsule (100 mg total) by mouth 2 (two) times daily. 20 capsule Margarette Canada, NP      PDMP not reviewed this encounter.   Margarette Canada, NP 10/11/20 1013

## 2020-10-11 NOTE — ED Triage Notes (Signed)
Pt c/o sore/blister to her left thumb, for several days. Pt is unsure of how it started. Pt has been treating with OTC abx cream, alcohol, peroxide and it is not improving. Area is red and swollen.

## 2020-10-11 NOTE — Discharge Instructions (Addendum)
Take the Doxycycline twice daily with food for 10 days.  Doxycycline will make you more sensitive to sunburn so wear sunscreen when outdoors and reapply it every 90 minutes.  Apply warm compresses to help promote drainage.  Use OTC Tylenol and Ibuprofen according to the package instructions as needed for pain.  Return for new or worsening symptoms.  This includes increase in swelling, redness, drainage, pain, Richardson the arm, or fever.

## 2020-10-13 LAB — AEROBIC CULTURE W GRAM STAIN (SUPERFICIAL SPECIMEN): Special Requests: NORMAL

## 2020-10-19 ENCOUNTER — Emergency Department
Admission: EM | Admit: 2020-10-19 | Discharge: 2020-10-19 | Disposition: A | Discharge status: Home / Self Care | Payer: Medicare Other | Attending: Emergency Medicine | Admitting: Emergency Medicine

## 2020-10-19 ENCOUNTER — Other Ambulatory Visit: Payer: Self-pay

## 2020-10-19 DIAGNOSIS — J45909 Unspecified asthma, uncomplicated: Secondary | ICD-10-CM | POA: Diagnosis not present

## 2020-10-19 DIAGNOSIS — W268XXA Contact with other sharp object(s), not elsewhere classified, initial encounter: Secondary | ICD-10-CM | POA: Diagnosis not present

## 2020-10-19 DIAGNOSIS — Z794 Long term (current) use of insulin: Secondary | ICD-10-CM | POA: Diagnosis not present

## 2020-10-19 DIAGNOSIS — Z79899 Other long term (current) drug therapy: Secondary | ICD-10-CM | POA: Insufficient documentation

## 2020-10-19 DIAGNOSIS — S61313A Laceration without foreign body of left middle finger with damage to nail, initial encounter: Secondary | ICD-10-CM

## 2020-10-19 DIAGNOSIS — Z7984 Long term (current) use of oral hypoglycemic drugs: Secondary | ICD-10-CM | POA: Diagnosis not present

## 2020-10-19 DIAGNOSIS — S61213A Laceration without foreign body of left middle finger without damage to nail, initial encounter: Secondary | ICD-10-CM | POA: Insufficient documentation

## 2020-10-19 DIAGNOSIS — E119 Type 2 diabetes mellitus without complications: Secondary | ICD-10-CM | POA: Diagnosis not present

## 2020-10-19 DIAGNOSIS — Z23 Encounter for immunization: Secondary | ICD-10-CM | POA: Diagnosis not present

## 2020-10-19 DIAGNOSIS — I10 Essential (primary) hypertension: Secondary | ICD-10-CM | POA: Diagnosis not present

## 2020-10-19 DIAGNOSIS — S60943A Unspecified superficial injury of left middle finger, initial encounter: Secondary | ICD-10-CM | POA: Diagnosis present

## 2020-10-19 MED ORDER — TETANUS-DIPHTH-ACELL PERTUSSIS 5-2.5-18.5 LF-MCG/0.5 IM SUSY
0.5000 mL | PREFILLED_SYRINGE | Freq: Once | INTRAMUSCULAR | Status: AC
  Administered 2020-10-19: 0.5 mL via INTRAMUSCULAR
  Filled 2020-10-19: qty 0.5

## 2020-10-19 MED ORDER — CEPHALEXIN 500 MG PO CAPS: 1000.0000 mg | ORAL_CAPSULE | Freq: Two times a day (BID) | ORAL | 0 refills | Status: DC

## 2020-10-19 NOTE — ED Provider Notes (Signed)
[facility] Emergency Department Provider Note  ____________________________________________  Time seen: Approximately 9:06 PM  I have reviewed the triage vital signs and the nursing notes.   HISTORY  Chief Complaint Laceration    HPI Joan Graves is a 50 y.o. female who presents the emergency department complaining of a laceration to the middle finger of the left hand.  Patient states that she was opening an air conditioning unit when it slipped and fell lacerating her finger.  Patient was concerned she had difficulty stopping the bleeding.  She states that because of the significant bleeding she was unable to visualize the wounds themselves.  She believes that they are at the distal end of her finger and still has good range of motion.  She applied multiple bandages but had bleeding around these bandages.  She arrives for evaluation.  Last tetanus shot was roughly 7 to 10 years ago.  Medical history as described below.       Past Medical History:  Diagnosis Date   Anemia    Anxiety    Asthma    Depression    Diabetes mellitus without complication (HCC)    GERD (gastroesophageal reflux disease)    Hypercholesteremia    Hypertension    Sleep apnea     There are no problems to display for this patient.   Past Surgical History:  Procedure Laterality Date   TUBAL LIGATION      Prior to Admission medications   Medication Sig Start Date End Date Taking? Authorizing Provider  cephALEXin (KEFLEX) 500 MG capsule Take 2 capsules (1,000 mg total) by mouth 2 (two) times daily. 10/19/20  Yes Gizzelle Lacomb, Charline Bills, PA-C  ACCU-CHEK SOFTCLIX LANCETS lancets CHECK GLUCOSE TWICE DIALY FOR GLUCOSE MONITORING E11.9 06/09/17   [provider]  ADVAIR DISKUS 500-50 MCG/DOSE AEPB 1 DOSE INHALED TWICE DAILY FOR ASTHMA, RINSE MOUTH OUT WITH WATER AFTE USE 04/24/17   [provider]  albuterol (VENTOLIN HFA) 108 (90 Base) MCG/ACT inhaler Inhale into the  lungs.    [provider]  amLODipine (NORVASC) 10 MG tablet Take 1 tablet by mouth daily. 10/17/18   [provider]  atorvastatin (LIPITOR) 20 MG tablet Take 1 tablet by mouth daily. 10/04/19   [provider]  buPROPion (WELLBUTRIN SR) 100 MG 12 hr tablet Take 100 mg by mouth 2 (two) times daily. 08/10/20   [provider]  clotrimazole (LOTRIMIN) 1 % cream APPLY TO AFFECTED AREA TWICE A DAY FOR FUNGAL INFECTION 06/14/17   [provider]  doxycycline (VIBRAMYCIN) 100 MG capsule Take 1 capsule (100 mg total) by mouth 2 (two) times daily. 10/11/20   Margarette Canada, NP  enalapril (VASOTEC) 20 MG tablet Take 2 tablets by mouth daily. 02/13/20   [provider]  ferrous sulfate 325 (65 FE) MG EC tablet 2 tabletsBY MOUTH  A DAY FOR ANEMIA 11/30/15   [provider]  glipiZIDE (GLUCOTROL XL) 10 MG 24 hr tablet Take only tablet by mouth daily for diabetes 08/14/15   [provider]  glucose blood test strip USE AS DIRECTED TWICE A DAY 07/08/15   [provider]  hydrochlorothiazide (MICROZIDE) 12.5 MG capsule Take 12.5 mg by mouth daily. 07/16/20   [provider]  Insulin Glargine (BASAGLAR KWIKPEN) 100 UNIT/ML SMARTSIG:65 Unit(s) SUB-Q Every Night 07/16/20   [provider]  JARDIANCE 25 MG TABS tablet Take 25 mg by mouth daily. 07/16/20   [provider]  metFORMIN (GLUCOPHAGE-XR) 500 MG  24 hr tablet 2 BY MOUTH TWICE DAILY FOR DIABETES 05/26/17   [provider]  omeprazole (PRILOSEC) 40 MG capsule Take by mouth. 12/06/15   [provider]  oxybutynin (DITROPAN-XL) 5 MG 24 hr tablet Take 5 mg by mouth daily. 08/09/20   [provider]    Allergies Aspirin  Family History  Problem Relation Age of Onset   Breast cancer Cousin     Social History Social History   Tobacco Use   Smoking status: Never   Smokeless tobacco: Never  Vaping Use   Vaping Use: Never used  Substance  Use Topics   Alcohol use: Never   Drug use: Never     Review of Systems  Constitutional: No fever/chills Eyes: No visual changes. No discharge ENT: No upper respiratory complaints. Cardiovascular: no chest pain. Respiratory: no cough. No SOB. Gastrointestinal: No abdominal pain.  No nausea, no vomiting.  No diarrhea.  No constipation. Musculoskeletal: Positive for laceration to the middle finger of the left hand Skin: Negative for rash, abrasions, lacerations, ecchymosis. Neurological: Negative for headaches, focal weakness or numbness.  10 System ROS otherwise negative.  ____________________________________________   PHYSICAL EXAM:  VITAL SIGNS: ED Triage Vitals  Enc Vitals Group     BP 10/19/20 2052 (!) 112/99     Pulse Rate 10/19/20 2052 (!) 102     Resp 10/19/20 2052 20     Temp 10/19/20 2052 97.9 F (36.6 C)     Temp Source 10/19/20 2052 Oral     SpO2 10/19/20 2052 98 %     Weight 10/19/20 2051 (!) 330 lb (149.7 kg)     Height 10/19/20 2051 '5\' 3"'$  (1.6 m)     Head Circumference --      Peak Flow --      Pain Score 10/19/20 2051 6     Pain Loc --      Pain Edu? --      Excl. in [place]? --      Constitutional: Alert and oriented. Well appearing and in no acute distress. Eyes: Conjunctivae are normal. PERRL. EOMI. Head: Atraumatic. ENT:      Ears:       Nose: No congestion/rhinnorhea.      Mouth/Throat: Mucous membranes are moist.  Neck: No stridor.    Cardiovascular: Normal rate, regular rhythm. Normal S1 and S2.  Good peripheral circulation. Respiratory: Normal respiratory effort without tachypnea or retractions. Lungs CTAB. Good air entry to the bases with no decreased or absent breath sounds. Musculoskeletal: Full range of motion to all extremities. No gross deformities appreciated.  Visualization of the left ex index finger reveals no active bleeding after bandaging is removed.  Patient has an avulsion type injury along the medial finger just medial to the  nail itself.  Patient has a slight scratch across the nail midline with a very superficial injury extending into the finger pad.  This appears more epidermal layer and does not involve the dermis or subcutaneous tissue.  Full range of motion to the digit.  Sensation capillary refill intact. Neurologic:  Normal speech and language. No gross focal neurologic deficits are appreciated.  Skin:  Skin is warm, dry and intact. No rash noted. Psychiatric: Mood and affect are normal. Speech and behavior are normal. Patient exhibits appropriate insight and judgement.   ____________________________________________   LABS (all labs ordered are listed, but only abnormal results are displayed)  Labs Reviewed - No data to display ____________________________________________  EKG   ____________________________________________  RADIOLOGY   No results found.  ____________________________________________    PROCEDURES  Procedure(s) performed:    Procedures    Medications  Tdap (BOOSTRIX) injection 0.5 mL (has no administration in time range)     ____________________________________________   INITIAL IMPRESSION / ASSESSMENT AND PLAN / ED COURSE  Pertinent labs & imaging results that were available during my care of the patient were reviewed by me and considered in my medical decision making (see chart for details).  Review of the [place] CSRS was performed in accordance of the [place] prior to dispensing any controlled drugs.           Patient's diagnosis is consistent with finger laceration.  Patient presented to the emergency department after cutting her finger on air conditioner unit this evening.  Tetanus shot was updated.  Patient was primarily concerned she could not evaluate the injury at home due to the bleeding.  Bleeding had finally subsided on her arrival.  It was well controlled with no resumption of active bleeding.  Patient has an avulsion type injury along the medial aspect  of the finger as well as a very superficial laceration involving the nail and the finger pad.  This was epidermal layer deep.  Again no bleeding.  Patient had Dermabond to seal the areas to lessen chance of infection but there is no substantial repair needed for either laceration.  Patient was placed on antibiotics prophylactically given her diabetic status and concern for infections..  Follow-up with primary care as needed.  Return precautions discussed with the patient.  Patient is given ED precautions to return to the ED for any worsening or new symptoms.     ____________________________________________  FINAL CLINICAL IMPRESSION(S) / ED DIAGNOSES  Final diagnoses:  Laceration of left middle finger without foreign body with damage to nail, initial encounter      NEW MEDICATIONS STARTED DURING THIS VISIT:  ED Discharge Orders          Ordered    cephALEXin (KEFLEX) 500 MG capsule  2 times daily        10/19/20 2133                This chart was dictated using voice recognition software/Dragon. Despite best efforts to proofread, errors can occur which can change the meaning. Any change was purely unintentional.    Darletta Moll, PA-C 10/19/20 2133    Vanessa [place], MD 10/20/20 [phone]

## 2020-10-19 NOTE — ED Triage Notes (Signed)
Pt states she cut her left middle finger on a metal air vent tonight

## 2020-11-28 ENCOUNTER — Encounter: Payer: Self-pay | Admitting: Intensive Care

## 2020-11-28 ENCOUNTER — Emergency Department
Admission: EM | Admit: 2020-11-28 | Discharge: 2020-11-28 | Disposition: A | Discharge status: Home / Self Care | Payer: Medicare Other | Attending: Emergency Medicine | Admitting: Emergency Medicine

## 2020-11-28 ENCOUNTER — Other Ambulatory Visit: Payer: Self-pay

## 2020-11-28 DIAGNOSIS — Z794 Long term (current) use of insulin: Secondary | ICD-10-CM | POA: Diagnosis not present

## 2020-11-28 DIAGNOSIS — E860 Dehydration: Secondary | ICD-10-CM | POA: Diagnosis not present

## 2020-11-28 DIAGNOSIS — E119 Type 2 diabetes mellitus without complications: Secondary | ICD-10-CM | POA: Diagnosis not present

## 2020-11-28 DIAGNOSIS — Z79899 Other long term (current) drug therapy: Secondary | ICD-10-CM | POA: Insufficient documentation

## 2020-11-28 DIAGNOSIS — Z87891 Personal history of nicotine dependence: Secondary | ICD-10-CM | POA: Diagnosis not present

## 2020-11-28 DIAGNOSIS — J45909 Unspecified asthma, uncomplicated: Secondary | ICD-10-CM | POA: Diagnosis not present

## 2020-11-28 DIAGNOSIS — Z7984 Long term (current) use of oral hypoglycemic drugs: Secondary | ICD-10-CM | POA: Insufficient documentation

## 2020-11-28 DIAGNOSIS — N3090 Cystitis, unspecified without hematuria: Secondary | ICD-10-CM | POA: Diagnosis not present

## 2020-11-28 DIAGNOSIS — I10 Essential (primary) hypertension: Secondary | ICD-10-CM | POA: Diagnosis not present

## 2020-11-28 DIAGNOSIS — R300 Dysuria: Secondary | ICD-10-CM | POA: Diagnosis present

## 2020-11-28 LAB — BASIC METABOLIC PANEL
Anion gap: 11 (ref 5–15)
BUN: 18 mg/dL (ref 6–20)
CO2: 26 mmol/L (ref 22–32)
Calcium: 9 mg/dL (ref 8.9–10.3)
Chloride: 99 mmol/L (ref 98–111)
Creatinine, Ser: 1.02 mg/dL — ABNORMAL HIGH (ref 0.44–1.00)
GFR, Estimated: >60 mL/min (ref >60)
Glucose, Bld: 171 mg/dL — ABNORMAL HIGH (ref 70–99)
Potassium: 3.9 mmol/L (ref 3.5–5.1)
Sodium: 136 mmol/L (ref 135–145)

## 2020-11-28 LAB — CBC WITH DIFFERENTIAL/PLATELET
Abs Immature Granulocytes: 0.02 10*3/uL (ref 0.00–0.07)
Basophils Absolute: 0 10*3/uL (ref 0.0–0.1)
Basophils Relative: 0 %
Eosinophils Absolute: 0 10*3/uL (ref 0.0–0.5)
Eosinophils Relative: 0 %
HCT: 45 % (ref 36.0–46.0)
Hemoglobin: 14.9 g/dL (ref 12.0–15.0)
Immature Granulocytes: 0 %
Lymphocytes Relative: 26 %
Lymphs Abs: 1.8 10*3/uL (ref 0.7–4.0)
MCH: 27.8 pg (ref 26.0–34.0)
MCHC: 33.1 g/dL (ref 30.0–36.0)
MCV: 84 fL (ref 80.0–100.0)
Monocytes Absolute: 0.5 10*3/uL (ref 0.1–1.0)
Monocytes Relative: 7 %
Neutro Abs: 4.7 10*3/uL (ref 1.7–7.7)
Neutrophils Relative %: 67 %
Platelets: 231 10*3/uL (ref 150–400)
RBC: 5.36 MIL/uL — ABNORMAL HIGH (ref 3.87–5.11)
RDW: 15 % (ref 11.5–15.5)
Smear Review: NORMAL
WBC: 7 10*3/uL (ref 4.0–10.5)
nRBC: 0 % (ref 0.0–0.2)

## 2020-11-28 LAB — URINALYSIS, COMPLETE (UACMP) WITH MICROSCOPIC
RBC / HPF: >50 RBC/hpf — ABNORMAL HIGH (ref 0–5)
Specific Gravity, Urine: 1.032 — ABNORMAL HIGH (ref 1.005–1.030)
Squamous Epithelial / LPF: NONE SEEN (ref 0–5)
WBC, UA: >50 WBC/hpf — ABNORMAL HIGH (ref 0–5)

## 2020-11-28 MED ORDER — CEPHALEXIN 500 MG PO CAPS
500.0000 mg | ORAL_CAPSULE | Freq: Once | ORAL | Status: AC
  Administered 2020-11-28: 500 mg via ORAL
  Filled 2020-11-28: qty 1

## 2020-11-28 MED ORDER — ONDANSETRON 4 MG PO TBDP: 4.0000 mg | ORAL_TABLET | Freq: Three times a day (TID) | ORAL | 0 refills | Status: DC | PRN

## 2020-11-28 MED ORDER — CEPHALEXIN 500 MG PO CAPS: 500.0000 mg | ORAL_CAPSULE | Freq: Three times a day (TID) | ORAL | 0 refills | Status: DC

## 2020-11-28 MED ORDER — ONDANSETRON 4 MG PO TBDP
8.0000 mg | ORAL_TABLET | ORAL | Status: AC
  Administered 2020-11-28: 8 mg via ORAL
  Filled 2020-11-28: qty 2

## 2020-11-28 NOTE — ED Provider Notes (Signed)
Emergency Medicine Provider Triage Evaluation Note  Joan Graves, a 50 y.o. female  was evaluated in triage.  Pt complains of presumed hematuria.  Patient reports 3 days of fever, chills, and malaise.  She notes today some blood in the toilet when she sat down to urinate.  She is unclear whether the urine is coming from the bladder or the vagina.  She reports she been in menopause since 2021.  She denies any fevers, chills, or sweats.  Denies any pelvic pain, abdominal pain, or flank pain.  Review of Systems  Positive: hematuria Negative: ABD pain   Physical Exam  BP 114/78 (BP Location: Left Arm)   Pulse (!) 123   Temp 98.5 F (36.9 C) (Oral)   Resp 18   Ht 5\' 3"  (1.6 m)   Wt (!) 145.2 kg   LMP 10/06/2017 (Exact Date)   SpO2 97%   BMI 56.69 kg/m  Gen:   Awake, no distress  NAD Resp:  Normal effort CTA MSK:   Moves extremities without difficulty  Other:  CVS: RRR  Medical Decision Making  Medically screening exam initiated at 6:47 PM.  Appropriate orders placed.  JULIONA VALES was informed that the remainder of the evaluation will be completed by another provider, this initial triage assessment does not replace that evaluation, and the importance of remaining in the ED until their evaluation is complete.  Patient with ED evaluation of presumed hematuria.   Melvenia Needles, PA-C 11/28/20 1849    Duffy Bruce, MD 11/28/20 2246

## 2020-11-28 NOTE — ED Notes (Signed)
Patient reports she is ready for d/c.  MD made aware

## 2020-11-28 NOTE — ED Notes (Signed)
Patient at nursing station requesting discharge paper work.  Patient provided discharge paperwork.  Would not return to room for vital signs or teaching.  Patient voiced understanding to pick up Rx and to follow up as needed

## 2020-11-28 NOTE — ED Provider Notes (Signed)
[facility] Emergency Department Provider Note  ____________________________________________  Time seen: Approximately 9:16 PM  I have reviewed the triage vital signs and the nursing notes.   HISTORY  Chief Complaint Vaginal Bleeding and Hematuria    HPI Joan Graves is a 50 y.o. female with a history of anxiety depression diabetes hypertension who comes ED complaining of dysuria, urinary frequency and hematuria.  She reports being in menopause for over a year.  Denies any abdominal pain or flank pain or back pain.  She does endorse some chills and fatigue for last few days.  Symptoms are waxing and waning, no alleviating factors.    Past Medical History:  Diagnosis Date   Anemia    Anxiety    Asthma    Depression    Diabetes mellitus without complication (HCC)    GERD (gastroesophageal reflux disease)    Hypercholesteremia    Hypertension    Sleep apnea      There are no problems to display for this patient.    Past Surgical History:  Procedure Laterality Date   TUBAL LIGATION       Prior to Admission medications   Medication Sig Start Date End Date Taking? Authorizing Provider  cephALEXin (KEFLEX) 500 MG capsule Take 1 capsule (500 mg total) by mouth 3 (three) times daily. 11/28/20  Yes Carrie Mew, MD  ondansetron (ZOFRAN ODT) 4 MG disintegrating tablet Take 1 tablet (4 mg total) by mouth every 8 (eight) hours as needed for nausea or vomiting. 11/28/20  Yes Carrie Mew, MD  ACCU-CHEK SOFTCLIX LANCETS lancets CHECK GLUCOSE TWICE DIALY FOR GLUCOSE MONITORING E11.9 06/09/17   [provider]  ADVAIR DISKUS 500-50 MCG/DOSE AEPB 1 DOSE INHALED TWICE DAILY FOR ASTHMA, RINSE MOUTH OUT WITH WATER AFTE USE 04/24/17   [provider]  albuterol (VENTOLIN HFA) 108 (90 Base) MCG/ACT inhaler Inhale into the lungs.    [provider]  amLODipine (NORVASC) 10 MG tablet Take 1 tablet by mouth daily. 10/17/18    [provider]  atorvastatin (LIPITOR) 20 MG tablet Take 1 tablet by mouth daily. 10/04/19   [provider]  buPROPion (WELLBUTRIN SR) 100 MG 12 hr tablet Take 100 mg by mouth 2 (two) times daily. 08/10/20   [provider]  clotrimazole (LOTRIMIN) 1 % cream APPLY TO AFFECTED AREA TWICE A DAY FOR FUNGAL INFECTION 06/14/17   [provider]  doxycycline (VIBRAMYCIN) 100 MG capsule Take 1 capsule (100 mg total) by mouth 2 (two) times daily. 10/11/20   Margarette Canada, NP  enalapril (VASOTEC) 20 MG tablet Take 2 tablets by mouth daily. 02/13/20   [provider]  ferrous sulfate 325 (65 FE) MG EC tablet 2 tabletsBY MOUTH  A DAY FOR ANEMIA 11/30/15   [provider]  glipiZIDE (GLUCOTROL XL) 10 MG 24 hr tablet Take only tablet by mouth daily for diabetes 08/14/15   [provider]  glucose blood test strip USE AS DIRECTED TWICE A DAY 07/08/15   [provider]  hydrochlorothiazide (MICROZIDE) 12.5 MG capsule Take 12.5 mg by mouth daily. 07/16/20   [provider]  Insulin Glargine (BASAGLAR KWIKPEN) 100 UNIT/ML SMARTSIG:65 Unit(s) SUB-Q Every Night 07/16/20   [provider]  JARDIANCE 25 MG TABS tablet Take 25 mg by mouth daily. 07/16/20   [provider]  metFORMIN (GLUCOPHAGE-XR) 500 MG 24 hr tablet 2 BY MOUTH TWICE DAILY FOR DIABETES 05/26/17   [provider]  omeprazole (PRILOSEC) 40 MG capsule  Take by mouth. 12/06/15   [provider]  oxybutynin (DITROPAN-XL) 5 MG 24 hr tablet Take 5 mg by mouth daily. 08/09/20   [provider]     Allergies Aspirin   Family History  Problem Relation Age of Onset   Breast cancer Cousin     Social History Social History   Tobacco Use   Smoking status: Former    Types: Cigarettes   Smokeless tobacco: Never  Vaping Use   Vaping Use: Every day   Substances: Nicotine, Flavoring  Substance Use Topics   Alcohol use: Never   Drug use:  Never    Review of Systems  Constitutional:   No fever or chills.  ENT:   No sore throat. No rhinorrhea. Cardiovascular:   No chest pain or syncope. Respiratory:   No dyspnea or cough. Gastrointestinal:   Negative for abdominal pain, vomiting and diarrhea.  Musculoskeletal:   Negative for focal pain or swelling All other systems reviewed and are negative except as documented above in ROS and HPI.  ____________________________________________   PHYSICAL EXAM:  VITAL SIGNS: ED Triage Vitals [11/28/20 1822]  Enc Vitals Group     BP 114/78     Pulse Rate (!) 123     Resp 18     Temp 98.5 F (36.9 C)     Temp Source Oral     SpO2 97 %     Weight (!) 320 lb (145.2 kg)     Height 5\' 3"  (1.6 m)     Head Circumference      Peak Flow      Pain Score 7     Pain Loc      Pain Edu?      Excl. in [place]?     Vital signs reviewed, nursing assessments reviewed.   Constitutional:   Alert and oriented. Non-toxic appearance. Eyes:   Conjunctivae are normal. EOMI. PERRL. ENT      Head:   Normocephalic and atraumatic.      Nose:   Wearing a mask.      Mouth/Throat:   Wearing a mask.      Neck:   No meningismus. Full ROM. Hematological/Lymphatic/Immunilogical:   No cervical lymphadenopathy. Cardiovascular:   RRR. Symmetric bilateral radial and DP pulses.  No murmurs. Cap refill less than 2 seconds. Respiratory:   Normal respiratory effort without tachypnea/retractions. Breath sounds are clear and equal bilaterally. No wheezes/rales/rhonchi. Gastrointestinal:   Soft with suprapubic tenderness. Non distended. There is no CVA tenderness.  No rebound, rigidity, or guarding. Genitourinary:   deferred Musculoskeletal:   Normal range of motion in all extremities. No joint effusions.  No lower extremity tenderness.  No edema. Neurologic:   Normal speech and language.  Motor grossly intact. No acute focal neurologic deficits are appreciated.  Skin:    Skin is warm, dry and intact. No rash  noted.  No petechiae, purpura, or bullae.  ____________________________________________    LABS (pertinent positives/negatives) (all labs ordered are listed, but only abnormal results are displayed) Labs Reviewed  BASIC METABOLIC PANEL - Abnormal; Notable for the following components:      Result Value   Glucose, Bld 171 (*)    Creatinine, Ser 1.02 (*)    All other components within normal limits  URINALYSIS, COMPLETE (UACMP) WITH MICROSCOPIC - Abnormal; Notable for the following components:   Color, Urine RED (*)    APPearance CLOUDY (*)    Specific Gravity, Urine 1.032 (*)    Glucose, UA   (*)  Value: TEST NOT REPORTED DUE TO COLOR INTERFERENCE OF URINE PIGMENT   Hgb urine dipstick   (*)    Value: TEST NOT REPORTED DUE TO COLOR INTERFERENCE OF URINE PIGMENT   Bilirubin Urine   (*)    Value: TEST NOT REPORTED DUE TO COLOR INTERFERENCE OF URINE PIGMENT   Ketones, ur   (*)    Value: TEST NOT REPORTED DUE TO COLOR INTERFERENCE OF URINE PIGMENT   Protein, ur   (*)    Value: TEST NOT REPORTED DUE TO COLOR INTERFERENCE OF URINE PIGMENT   Nitrite   (*)    Value: TEST NOT REPORTED DUE TO COLOR INTERFERENCE OF URINE PIGMENT   Leukocytes,Ua   (*)    Value: TEST NOT REPORTED DUE TO COLOR INTERFERENCE OF URINE PIGMENT   RBC / HPF >50 (*)    WBC, UA >50 (*)    Bacteria, UA RARE (*)    All other components within normal limits  CBC WITH DIFFERENTIAL/PLATELET - Abnormal; Notable for the following components:   RBC 5.36 (*)    All other components within normal limits   ____________________________________________   EKG    ____________________________________________    RADIOLOGY  No results found.  ____________________________________________   PROCEDURES Procedures  ____________________________________________    CLINICAL IMPRESSION / ASSESSMENT AND PLAN / ED COURSE  Medications ordered in the ED: Medications  ondansetron (ZOFRAN-ODT) disintegrating tablet 8 mg  (8 mg Oral Given 11/28/20 2042)  cephALEXin (KEFLEX) capsule 500 mg (500 mg Oral Given 11/28/20 2042)    Pertinent labs & imaging results that were available during my care of the patient were reviewed by me and considered in my medical decision making (see chart for details).  Joan Graves was evaluated in Emergency Department on 11/28/2020 for the symptoms described in the history of present illness. She was evaluated in the context of the global COVID-19 pandemic, which necessitated consideration that the patient might be at risk for infection with the SARS-CoV-2 virus that causes COVID-19. Institutional protocols and algorithms that pertain to the evaluation of patients at risk for COVID-19 are in a state of rapid change based on information released by regulatory bodies including the CDC and federal and state organizations. These policies and algorithms were followed during the patient's care in the ED.   Patient presents with dysuria and urinary frequency and hematuria.  Suprapubic tenderness.  Urinalysis and clinical picture consistent with cystitis.  Blood counts, creatinine, vital signs are all okay.  She is tachycardic with a heart rate of 110 on my exam which I think is due to poor oral intake.  She is tolerating p.o., encourage her to drink lots of water to help with hydration and UTI symptoms.  She is not septic and she is stable for discharge home.      ____________________________________________   FINAL CLINICAL IMPRESSION(S) / ED DIAGNOSES    Final diagnoses:  Cystitis  Dehydration     ED Discharge Orders          Ordered    cephALEXin (KEFLEX) 500 MG capsule  3 times daily        11/28/20 2115    ondansetron (ZOFRAN ODT) 4 MG disintegrating tablet  Every 8 hours PRN        11/28/20 2115            Portions of this note were generated with dragon dictation software. Dictation errors may occur despite best attempts at proofreading.    Carrie Mew,  MD 11/28/20 2126

## 2020-11-28 NOTE — ED Triage Notes (Addendum)
Patient reports dark red bleeding that started today. Unsure whether the blood is coming from urine or vagina. Last menstrual period June 2021. Also c/o chills/sweats this past week.

## 2020-12-07 ENCOUNTER — Other Ambulatory Visit: Payer: Self-pay

## 2020-12-07 ENCOUNTER — Emergency Department
Admission: EM | Admit: 2020-12-07 | Discharge: 2020-12-08 | Disposition: A | Discharge status: Home / Self Care | Payer: Medicare Other | Attending: Emergency Medicine | Admitting: Emergency Medicine

## 2020-12-07 ENCOUNTER — Emergency Department: Payer: Medicare Other

## 2020-12-07 DIAGNOSIS — Z20822 Contact with and (suspected) exposure to covid-19: Secondary | ICD-10-CM | POA: Insufficient documentation

## 2020-12-07 DIAGNOSIS — R0602 Shortness of breath: Secondary | ICD-10-CM | POA: Diagnosis present

## 2020-12-07 DIAGNOSIS — J45909 Unspecified asthma, uncomplicated: Secondary | ICD-10-CM | POA: Insufficient documentation

## 2020-12-07 DIAGNOSIS — Z7984 Long term (current) use of oral hypoglycemic drugs: Secondary | ICD-10-CM | POA: Diagnosis not present

## 2020-12-07 DIAGNOSIS — Z794 Long term (current) use of insulin: Secondary | ICD-10-CM | POA: Diagnosis not present

## 2020-12-07 DIAGNOSIS — Z79899 Other long term (current) drug therapy: Secondary | ICD-10-CM | POA: Diagnosis not present

## 2020-12-07 DIAGNOSIS — I4891 Unspecified atrial fibrillation: Secondary | ICD-10-CM | POA: Diagnosis not present

## 2020-12-07 DIAGNOSIS — Z87891 Personal history of nicotine dependence: Secondary | ICD-10-CM | POA: Insufficient documentation

## 2020-12-07 DIAGNOSIS — E119 Type 2 diabetes mellitus without complications: Secondary | ICD-10-CM | POA: Insufficient documentation

## 2020-12-07 DIAGNOSIS — I10 Essential (primary) hypertension: Secondary | ICD-10-CM | POA: Diagnosis not present

## 2020-12-07 LAB — COMPREHENSIVE METABOLIC PANEL
ALT: 18 U/L (ref 0–44)
AST: 20 U/L (ref 15–41)
Albumin: 3.8 g/dL (ref 3.5–5.0)
Alkaline Phosphatase: 78 U/L (ref 38–126)
Anion gap: 8 (ref 5–15)
BUN: 15 mg/dL (ref 6–20)
CO2: 29 mmol/L (ref 22–32)
Calcium: 8.9 mg/dL (ref 8.9–10.3)
Chloride: 101 mmol/L (ref 98–111)
Creatinine, Ser: 1 mg/dL (ref 0.44–1.00)
GFR, Estimated: >60 mL/min (ref >60)
Glucose, Bld: 162 mg/dL — ABNORMAL HIGH (ref 70–99)
Potassium: 4.6 mmol/L (ref 3.5–5.1)
Sodium: 138 mmol/L (ref 135–145)
Total Bilirubin: 0.5 mg/dL (ref 0.3–1.2)
Total Protein: 7.6 g/dL (ref 6.5–8.1)

## 2020-12-07 LAB — RESP PANEL BY RT-PCR (FLU A&B, COVID) ARPGX2
Influenza A by PCR: NEGATIVE
Influenza B by PCR: NEGATIVE
SARS Coronavirus 2 by RT PCR: NEGATIVE

## 2020-12-07 LAB — TSH: TSH: 1.866 u[IU]/mL (ref 0.350–4.500)

## 2020-12-07 LAB — CBC
HCT: 42.8 % (ref 36.0–46.0)
Hemoglobin: 14.4 g/dL (ref 12.0–15.0)
MCH: 29.4 pg (ref 26.0–34.0)
MCHC: 33.6 g/dL (ref 30.0–36.0)
MCV: 87.3 fL (ref 80.0–100.0)
Platelets: 242 10*3/uL (ref 150–400)
RBC: 4.9 MIL/uL (ref 3.87–5.11)
RDW: 16 % — ABNORMAL HIGH (ref 11.5–15.5)
WBC: 5.3 10*3/uL (ref 4.0–10.5)
nRBC: 0 % (ref 0.0–0.2)

## 2020-12-07 LAB — D-DIMER, QUANTITATIVE: D-Dimer, Quant: 0.52 ug/mL-FEU — ABNORMAL HIGH (ref 0.00–0.50)

## 2020-12-07 LAB — T4, FREE: Free T4: 0.93 ng/dL (ref 0.61–1.12)

## 2020-12-07 LAB — MAGNESIUM: Magnesium: 1.8 mg/dL (ref 1.7–2.4)

## 2020-12-07 LAB — TROPONIN I (HIGH SENSITIVITY): Troponin I (High Sensitivity): 7 ng/L (ref <18)

## 2020-12-07 LAB — BRAIN NATRIURETIC PEPTIDE: B Natriuretic Peptide: 86.3 pg/mL (ref 0.0–100.0)

## 2020-12-07 MED ORDER — LORAZEPAM 2 MG/ML IJ SOLN
0.5000 mg | Freq: Once | INTRAMUSCULAR | Status: AC
  Administered 2020-12-07: 0.5 mg via INTRAVENOUS
  Filled 2020-12-07: qty 1

## 2020-12-07 MED ORDER — METOPROLOL TARTRATE 5 MG/5ML IV SOLN
2.5000 mg | Freq: Once | INTRAVENOUS | Status: AC
  Administered 2020-12-07: 2.5 mg via INTRAVENOUS
  Filled 2020-12-07: qty 5

## 2020-12-07 MED ORDER — METOPROLOL TARTRATE 25 MG PO TABS
12.5000 mg | ORAL_TABLET | Freq: Once | ORAL | Status: AC
  Administered 2020-12-07: 12.5 mg via ORAL
  Filled 2020-12-07: qty 1

## 2020-12-07 MED ORDER — IOHEXOL 350 MG/ML SOLN
75.0000 mL | Freq: Once | INTRAVENOUS | Status: AC | PRN
  Administered 2020-12-08: 80 mL via INTRAVENOUS

## 2020-12-07 NOTE — ED Triage Notes (Signed)
Pt In with co chest pain states started this am. Pt states started along with panic attacks. Pt has hx of the same, has been arguing with boyfriend. PT denies SI or HI.

## 2020-12-07 NOTE — ED Provider Notes (Signed)
[facility] Emergency Department Provider Note  ____________________________________________   Event Date/Time   First MD Initiated Contact with Patient 12/07/20 2133     (approximate)  I have reviewed the triage vital signs and the nursing notes.   HISTORY  Chief Complaint Panic Attack and Chest Pain    HPI Joan Graves is a 50 y.o. female with diabetes, HTN who comes in with concerns for chest pain and SOB.  Patient reports that she got an argument with her boyfriend this morning around 10 AM.  She reports having some chest pain, shortness of breath that was severe, constant, nothing made it better or worse.  Reports that she gets this previously when she is had anxiety attacks.  She states that she has been taking all of her anxiety medications.  Denies any SI.  Denies any abdominal pain, unilateral leg swelling, history of blood clots or any other concerns.  No alcohol or drug use.          Past Medical History:  Diagnosis Date   Anemia    Anxiety    Asthma    Depression    Diabetes mellitus without complication (HCC)    GERD (gastroesophageal reflux disease)    Hypercholesteremia    Hypertension    Sleep apnea     There are no problems to display for this patient.   Past Surgical History:  Procedure Laterality Date   TUBAL LIGATION      Prior to Admission medications   Medication Sig Start Date End Date Taking? Authorizing Provider  ACCU-CHEK SOFTCLIX LANCETS lancets CHECK GLUCOSE TWICE DIALY FOR GLUCOSE MONITORING E11.9 06/09/17   [provider]  ADVAIR DISKUS 500-50 MCG/DOSE AEPB 1 DOSE INHALED TWICE DAILY FOR ASTHMA, RINSE MOUTH OUT WITH WATER AFTE USE 04/24/17   [provider]  albuterol (VENTOLIN HFA) 108 (90 Base) MCG/ACT inhaler Inhale into the lungs.    [provider]  amLODipine (NORVASC) 10 MG tablet Take 1 tablet by mouth daily. 10/17/18   [provider]  atorvastatin (LIPITOR) 20 MG  tablet Take 1 tablet by mouth daily. 10/04/19   [provider]  buPROPion (WELLBUTRIN SR) 100 MG 12 hr tablet Take 100 mg by mouth 2 (two) times daily. 08/10/20   [provider]  cephALEXin (KEFLEX) 500 MG capsule Take 1 capsule (500 mg total) by mouth 3 (three) times daily. 11/28/20   Carrie Mew, MD  clotrimazole (LOTRIMIN) 1 % cream APPLY TO AFFECTED AREA TWICE A DAY FOR FUNGAL INFECTION 06/14/17   [provider]  doxycycline (VIBRAMYCIN) 100 MG capsule Take 1 capsule (100 mg total) by mouth 2 (two) times daily. 10/11/20   Margarette Canada, NP  enalapril (VASOTEC) 20 MG tablet Take 2 tablets by mouth daily. 02/13/20   [provider]  ferrous sulfate 325 (65 FE) MG EC tablet 2 tabletsBY MOUTH  A DAY FOR ANEMIA 11/30/15   [provider]  glipiZIDE (GLUCOTROL XL) 10 MG 24 hr tablet Take only tablet by mouth daily for diabetes 08/14/15   [provider]  glucose blood test strip USE AS DIRECTED TWICE A DAY 07/08/15   [provider]  hydrochlorothiazide (MICROZIDE) 12.5 MG capsule Take 12.5 mg by mouth daily. 07/16/20   [provider]  Insulin Glargine (BASAGLAR KWIKPEN) 100 UNIT/ML SMARTSIG:65 Unit(s) SUB-Q Every Night 07/16/20   [provider]  JARDIANCE 25 MG TABS tablet Take 25 mg by mouth daily. 07/16/20   [provider]  metFORMIN (GLUCOPHAGE-XR) 500 MG 24 hr tablet 2 BY MOUTH TWICE DAILY FOR DIABETES 05/26/17   [provider]  omeprazole (PRILOSEC) 40 MG capsule Take by mouth. 12/06/15   [provider]  ondansetron (ZOFRAN ODT) 4 MG disintegrating tablet Take 1 tablet (4 mg total) by mouth every 8 (eight) hours as needed for nausea or vomiting. 11/28/20   Carrie Mew, MD  oxybutynin (DITROPAN-XL) 5 MG 24 hr tablet Take 5 mg by mouth daily. 08/09/20   [provider]    Allergies Aspirin  Family History  Problem Relation Age of Onset   Breast cancer Cousin     Social  History Social History   Tobacco Use   Smoking status: Former    Types: Cigarettes   Smokeless tobacco: Never  Vaping Use   Vaping Use: Every day   Substances: Nicotine, Flavoring  Substance Use Topics   Alcohol use: Never   Drug use: Never      Review of Systems Constitutional: No fever/chills Eyes: No visual changes. ENT: No sore throat. Cardiovascular: Positive chest pain Respiratory: Positive shortness of breath Gastrointestinal: No abdominal pain.  No nausea, no vomiting.  No diarrhea.  No constipation. Genitourinary: Negative for dysuria. Musculoskeletal: Negative for back pain. Skin: Negative for rash. Neurological: Negative for headaches, focal weakness or numbness. Psych: Positive anxiety All other ROS negative ____________________________________________   PHYSICAL EXAM:  VITAL SIGNS: ED Triage Vitals  Enc Vitals Group     BP 12/07/20 2105 118/62     Pulse Rate 12/07/20 2105 (!) 109     Resp 12/07/20 2105 18     Temp 12/07/20 2105 97.8 F (36.6 C)     Temp Source 12/07/20 2105 Oral     SpO2 12/07/20 2105 99 %     Weight 12/07/20 2112 (!) 320 lb (145.2 kg)     Height 12/07/20 2112 5\' 3"  (1.6 m)     Head Circumference --      Peak Flow --      Pain Score 12/07/20 2112 10     Pain Loc --      Pain Edu? --      Excl. in [place]? --     Constitutional: Alert and oriented. Well appearing and in no acute distress. Eyes: Conjunctivae are normal. EOMI. Head: Atraumatic. Nose: No congestion/rhinnorhea. Mouth/Throat: Mucous membranes are moist.   Neck: No stridor. Trachea Midline. FROM Cardiovascular: Irregular, tachycardic. Grossly normal heart sounds.  Good peripheral circulation. Respiratory: Normal respiratory effort.  No retractions. Lungs CTAB. Gastrointestinal: Soft and nontender. No distention. No abdominal bruits.  Musculoskeletal: No lower extremity tenderness nor edema.  No joint effusions. Neurologic:  Normal speech and language. No gross focal  neurologic deficits are appreciated.  Skin:  Skin is warm, dry and intact. No rash noted. Psychiatric: Mood and affect are normal. Speech and behavior are normal. GU: Deferred   ____________________________________________   LABS (all labs ordered are listed, but only abnormal results are displayed)  Labs Reviewed  CBC - Abnormal; Notable for the following components:      Result Value   RDW 16.0 (*)    All other components within normal limits  RESP PANEL BY RT-PCR (FLU A&B, COVID) ARPGX2  COMPREHENSIVE METABOLIC PANEL  TSH  T4, FREE  MAGNESIUM  BRAIN NATRIURETIC PEPTIDE  D-DIMER, QUANTITATIVE  TROPONIN I (HIGH SENSITIVITY)   ____________________________________________   ED ECG REPORT I, Vanessa Bellmont, the attending physician, personally viewed and interpreted this ECG.  A. fib with  a rate of 127, no ST elevation, no T wave inversions, normal intervals ____________________________________________  RADIOLOGY Robert Bellow, personally viewed and evaluated these images (plain radiographs) as part of my medical decision making, as well as reviewing the written report by the radiologist.  ED MD interpretation:  no pna   Official radiology report(s): DG Chest Portable 1 View  Result Date: 12/07/2020 CLINICAL DATA:  Chest pain. EXAM: PORTABLE CHEST 1 VIEW COMPARISON:  October 11, 2015 FINDINGS: Low lung volumes are noted. There is no evidence of acute infiltrate, pleural effusion or pneumothorax. The heart size and mediastinal contours are within normal limits. The visualized skeletal structures are unremarkable. IMPRESSION: No active disease. Electronically Signed   By: Virgina Norfolk M.D.   On: 12/07/2020 22:09    ____________________________________________   PROCEDURES  Procedure(s) performed (including Critical Care):  .1-3 Lead EKG Interpretation Performed by: Vanessa [place], MD Authorized by: Vanessa [place], MD     Interpretation: abnormal     ECG rate:   90-120s   Rhythm: atrial fibrillation     Ectopy: none     Conduction: normal   .Critical Care Performed by: Vanessa [place], MD Authorized by: Vanessa [place], MD   Critical care provider statement:    Critical care time (minutes):  35   Critical care was necessary to treat or prevent imminent or life-threatening deterioration of the following conditions:  Circulatory failure   Critical care was time spent personally by me on the following activities:  Blood draw for specimens, discussions with consultants, discussions with primary provider, ordering and performing treatments and interventions, ordering and review of laboratory studies, ordering and review of radiographic studies, pulse oximetry, re-evaluation of patient's condition, review of old charts, examination of patient and obtaining history from patient or surrogate   I assumed direction of critical care for this patient from another provider in my specialty: no     Care discussed with: admitting provider     ____________________________________________   INITIAL IMPRESSION / Twining / ED COURSE  Joan Graves was evaluated in Emergency Department on 12/07/2020 for the symptoms described in the history of present illness. She was evaluated in the context of the global COVID-19 pandemic, which necessitated consideration that the patient might be at risk for infection with the SARS-CoV-2 virus that causes COVID-19. Institutional protocols and algorithms that pertain to the evaluation of patients at risk for COVID-19 are in a state of rapid change based on information released by regulatory bodies including the CDC and federal and state organizations. These policies and algorithms were followed during the patient's care in the ED.    Patient comes in with new onset atrial fibrillation with RVR.  Will get labs to evaluate for any electrolyte abnormalities, thyroid dysfunction, dehydration, blood clots, ACS.  We will keep  patient the cardiac monitor.  Given patient does report feeling very anxious we will give a small dose of Ativan to help her relax.  We will give additional beta-blockers or calcium channel blockers based upon how her heart rates are doing patient does report some anxiety but denies any SI and patient does have new A. fib which I suspect is causing her symptoms.  Do not feel that she needs a psych consult.   Patient comes in with new atrial fibrillation.  Heart rates have been in the 90s to 120s.  She initially is in the 90s I walk in the room she goes up to the 120s.  I gave her some Ativan and was still doing this therefore we will trial a small dose of IV metoprolol and start her on some oral metoprolol.  D-dimer was slightly elevated so get a CT PE rule out pulmonary embolism given the shortness of breath.  Patient will be handed off to oncoming team pending repeat troponin, CT.  Patient actually has follow-up with cardiology on Wednesday.  But her CHA2DS2-VASc is 3 so if patient is stable for discharge home I would start her on some metoprolol as well as a blood thinner          ____________________________________________   FINAL CLINICAL IMPRESSION(S) / ED DIAGNOSES   Final diagnoses:  Atrial fibrillation with rapid ventricular response ([place])      MEDICATIONS GIVEN DURING THIS VISIT:  Medications  LORazepam (ATIVAN) injection 0.5 mg (0.5 mg Intravenous Given 12/07/20 2235)  metoprolol tartrate (LOPRESSOR) injection 2.5 mg (2.5 mg Intravenous Given 12/07/20 2321)  metoprolol tartrate (LOPRESSOR) tablet 12.5 mg (12.5 mg Oral Given 12/07/20 2319)     ED Discharge Orders     None        Note:  This document was prepared using Dragon voice recognition software and may include unintentional dictation errors.    Vanessa Gasquet, MD 12/07/20 2330

## 2020-12-07 NOTE — ED Notes (Signed)
Assisted patient to the restroom to obtain urine sample

## 2020-12-08 ENCOUNTER — Encounter: Payer: Self-pay | Admitting: Radiology

## 2020-12-08 DIAGNOSIS — I4891 Unspecified atrial fibrillation: Secondary | ICD-10-CM | POA: Diagnosis not present

## 2020-12-08 LAB — TROPONIN I (HIGH SENSITIVITY): Troponin I (High Sensitivity): 6 ng/L (ref <18)

## 2020-12-08 MED ORDER — APIXABAN 5 MG PO TABS
5.0000 mg | ORAL_TABLET | Freq: Once | ORAL | Status: AC
  Administered 2020-12-08: 5 mg via ORAL
  Filled 2020-12-08: qty 1

## 2020-12-08 MED ORDER — APIXABAN 5 MG PO TABS: 5.0000 mg | ORAL_TABLET | Freq: Two times a day (BID) | ORAL | 0 refills | Status: DC

## 2020-12-08 MED ORDER — METOPROLOL TARTRATE 25 MG PO TABS: 12.5000 mg | ORAL_TABLET | Freq: Two times a day (BID) | ORAL | 0 refills | Status: DC

## 2020-12-08 NOTE — ED Provider Notes (Signed)
CT negative for PE.  Rate is well controlled in the 80s.  Plan to discharge home on metoprolol and Eliquis per plan left by Dr. Jari Pigg.   I have personally reviewed the images performed during this visit and I agree with the Radiologist's read.   Interpretation by Radiologist:  CT Angio Chest PE W and/or Wo Contrast  Result Date: 12/08/2020 CLINICAL DATA:  Chest pain and shortness of breath. EXAM: CT ANGIOGRAPHY CHEST WITH CONTRAST TECHNIQUE: Multidetector CT imaging of the chest was performed using the standard protocol during bolus administration of intravenous contrast. Multiplanar CT image reconstructions and MIPs were obtained to evaluate the vascular anatomy. CONTRAST:  71mL OMNIPAQUE IOHEXOL 350 MG/ML SOLN COMPARISON:  October 11, 2015 FINDINGS: Cardiovascular: Satisfactory opacification of the pulmonary arteries to the segmental level. No evidence of pulmonary embolism. Normal heart size. No pericardial effusion. Mediastinum/Nodes: No enlarged mediastinal, hilar, or axillary lymph nodes. Thyroid gland, trachea, and esophagus demonstrate no significant findings. Lungs/Pleura: Lungs are clear. No pleural effusion or pneumothorax. Upper Abdomen: There is diffuse fatty infiltration of the liver parenchyma. Musculoskeletal: No chest wall abnormality. No acute or significant osseous findings. Review of the MIP images confirms the above findings. IMPRESSION: 1. No CT evidence of pulmonary embolism or other acute intrathoracic process. 2. Diffuse fatty infiltration of the liver. Electronically Signed   By: Virgina Norfolk M.D.   On: 12/08/2020 00:24   DG Chest Portable 1 View  Result Date: 12/07/2020 CLINICAL DATA:  Chest pain. EXAM: PORTABLE CHEST 1 VIEW COMPARISON:  October 11, 2015 FINDINGS: Low lung volumes are noted. There is no evidence of acute infiltrate, pleural effusion or pneumothorax. The heart size and mediastinal contours are within normal limits. The visualized skeletal structures are  unremarkable. IMPRESSION: No active disease. Electronically Signed   By: Virgina Norfolk M.D.   On: 12/07/2020 22:09      Joan Re, MD 12/08/20 [phone]

## 2020-12-08 NOTE — Discharge Instructions (Addendum)
Take the blood thinner and metoprolol as prescribed.  Follow-up with your cardiologist on your schedule visit.  Return to the emergency room for chest pain, shortness of breath, dizziness, or if you pass out.

## 2020-12-09 DIAGNOSIS — I480 Paroxysmal atrial fibrillation: Secondary | ICD-10-CM | POA: Insufficient documentation

## 2020-12-10 ENCOUNTER — Other Ambulatory Visit: Payer: Self-pay | Admitting: Student

## 2020-12-10 ENCOUNTER — Other Ambulatory Visit: Payer: Self-pay | Admitting: Internal Medicine

## 2020-12-10 DIAGNOSIS — G4733 Obstructive sleep apnea (adult) (pediatric): Secondary | ICD-10-CM

## 2020-12-10 DIAGNOSIS — I480 Paroxysmal atrial fibrillation: Secondary | ICD-10-CM

## 2020-12-10 DIAGNOSIS — I350 Nonrheumatic aortic (valve) stenosis: Secondary | ICD-10-CM

## 2020-12-10 DIAGNOSIS — I6523 Occlusion and stenosis of bilateral carotid arteries: Secondary | ICD-10-CM

## 2020-12-15 ENCOUNTER — Ambulatory Visit
Admission: RE | Admit: 2020-12-15 | Discharge: 2020-12-15 | Disposition: A | Discharge status: Home / Self Care | Payer: Medicare Other | Source: Ambulatory Visit | Attending: Student | Admitting: Student

## 2020-12-15 ENCOUNTER — Other Ambulatory Visit: Payer: Self-pay

## 2020-12-15 DIAGNOSIS — I350 Nonrheumatic aortic (valve) stenosis: Secondary | ICD-10-CM

## 2020-12-15 DIAGNOSIS — I080 Rheumatic disorders of both mitral and aortic valves: Secondary | ICD-10-CM | POA: Diagnosis not present

## 2020-12-15 DIAGNOSIS — I6523 Occlusion and stenosis of bilateral carotid arteries: Secondary | ICD-10-CM | POA: Diagnosis not present

## 2020-12-15 DIAGNOSIS — I10 Essential (primary) hypertension: Secondary | ICD-10-CM | POA: Diagnosis not present

## 2020-12-15 DIAGNOSIS — G4733 Obstructive sleep apnea (adult) (pediatric): Secondary | ICD-10-CM | POA: Insufficient documentation

## 2020-12-15 DIAGNOSIS — E119 Type 2 diabetes mellitus without complications: Secondary | ICD-10-CM | POA: Insufficient documentation

## 2020-12-15 DIAGNOSIS — I480 Paroxysmal atrial fibrillation: Secondary | ICD-10-CM | POA: Diagnosis present

## 2020-12-15 LAB — ECHOCARDIOGRAM COMPLETE
AR max vel: 2.37 cm2
AV Area VTI: 2.27 cm2
AV Area mean vel: 2.21 cm2
AV Mean grad: 9 mmHg
AV Peak grad: 17.1 mmHg
Ao pk vel: 2.07 m/s
Area-P 1/2: 3.64 cm2
MV VTI: 2.67 cm2
S' Lateral: 3.28 cm

## 2020-12-15 NOTE — Progress Notes (Signed)
*  PRELIMINARY RESULTS* Echocardiogram 2D Echocardiogram has been performed.  Joan Graves Joan Graves Joan Graves Joan Graves 12/15/2020, 8:52 AM

## 2020-12-18 ENCOUNTER — Other Ambulatory Visit (HOSPITAL_COMMUNITY): Payer: Self-pay | Admitting: Student

## 2020-12-18 ENCOUNTER — Ambulatory Visit
Admission: EM | Admit: 2020-12-18 | Discharge: 2020-12-18 | Disposition: A | Discharge status: Home / Self Care | Payer: Medicare Other

## 2020-12-18 ENCOUNTER — Other Ambulatory Visit: Payer: Self-pay | Admitting: Student

## 2020-12-18 ENCOUNTER — Other Ambulatory Visit: Payer: Self-pay

## 2020-12-18 DIAGNOSIS — R0981 Nasal congestion: Secondary | ICD-10-CM | POA: Diagnosis not present

## 2020-12-18 DIAGNOSIS — I480 Paroxysmal atrial fibrillation: Secondary | ICD-10-CM

## 2020-12-18 DIAGNOSIS — I6523 Occlusion and stenosis of bilateral carotid arteries: Secondary | ICD-10-CM

## 2020-12-18 MED ORDER — FLUTICASONE PROPIONATE 50 MCG/ACT NA SUSP: 1.0000 | Freq: Every day | NASAL | 2 refills | Status: DC

## 2020-12-18 NOTE — Discharge Instructions (Signed)
Use one spray of Flonase each side.

## 2020-12-18 NOTE — ED Triage Notes (Signed)
Pt c/o sore throat, dry mouth and nasal congestion for about 2 days. Pt also has some hoarseness. Pt denies cough, fever or other symptoms.

## 2020-12-18 NOTE — ED Provider Notes (Signed)
[facility]  ____________________________________________  Time seen: Approximately 11:14 AM  I have reviewed the triage vital signs and the nursing notes.   HISTORY  Chief Complaint Sore Throat and Nasal Congestion   Historian Patient     HPI Joan Graves is a 50 y.o. female presents to the urgent care with nasal congestion, dry mouth and dry throat for the past 2 days.  Patient reports that she has been sleeping with her mouth open and today she noticed she has some hoarseness.  She denies difficulty swallowing or any pain underneath the tongue.  She reports that she was recently diagnosed with atrial fibrillation and had follow-up appointment with cardiology.   Past Medical History:  Diagnosis Date   Anemia    Anxiety    Asthma    Depression    Diabetes mellitus without complication (HCC)    GERD (gastroesophageal reflux disease)    Hypercholesteremia    Hypertension    Sleep apnea      Immunizations up to date:  Yes.     Past Medical History:  Diagnosis Date   Anemia    Anxiety    Asthma    Depression    Diabetes mellitus without complication (HCC)    GERD (gastroesophageal reflux disease)    Hypercholesteremia    Hypertension    Sleep apnea     There are no problems to display for this patient.   Past Surgical History:  Procedure Laterality Date   TUBAL LIGATION      Prior to Admission medications   Medication Sig Start Date End Date Taking? Authorizing Provider  ADVAIR DISKUS 500-50 MCG/DOSE AEPB 1 DOSE INHALED TWICE DAILY FOR ASTHMA, RINSE MOUTH OUT WITH WATER AFTE USE 04/24/17  Yes [provider]  albuterol (VENTOLIN HFA) 108 (90 Base) MCG/ACT inhaler Inhale into the lungs.   Yes [provider]  amiodarone (PACERONE) 200 MG tablet Take 200 mg by mouth 2 (two) times daily. 12/09/20  Yes [provider]  amLODipine (NORVASC) 10 MG tablet Take 1 tablet by mouth daily. 10/17/18  Yes [provider]  apixaban (ELIQUIS) 5 MG TABS tablet Take 1 tablet (5 mg total) by mouth 2 (two) times daily. 12/08/20 01/07/21 Yes Veronese, [place], MD  atorvastatin (LIPITOR) 20 MG tablet Take 1 tablet by mouth daily. 10/04/19  Yes [provider]  buPROPion (WELLBUTRIN SR) 100 MG 12 hr tablet Take 100 mg by mouth 2 (two) times daily. 08/10/20  Yes [provider]  enalapril (VASOTEC) 20 MG tablet Take 2 tablets by mouth daily. 02/13/20  Yes [provider]  ferrous sulfate 325 (65 FE) MG EC tablet 2 tabletsBY MOUTH  A DAY FOR ANEMIA 11/30/15  Yes [provider]  fluticasone (FLONASE) 50 MCG/ACT nasal spray Place 1 spray into both nostrils daily for 7 days. 12/18/20 12/25/20 Yes Vallarie Mare M, PA-C  glipiZIDE (GLUCOTROL XL) 10 MG 24 hr tablet Take only tablet by mouth daily for diabetes 08/14/15  Yes [provider]  Insulin Glargine (BASAGLAR KWIKPEN) 100 UNIT/ML SMARTSIG:65 Unit(s) SUB-Q Every Night 07/16/20  Yes [provider]  JARDIANCE 25 MG TABS tablet Take 25 mg by mouth daily. 07/16/20  Yes [provider]  metFORMIN (GLUCOPHAGE-XR) 500 MG 24 hr tablet 2 BY MOUTH TWICE DAILY FOR DIABETES 05/26/17  Yes [provider]  metoprolol tartrate (LOPRESSOR) 25 MG tablet Take 0.5 tablets (12.5 mg total) by mouth 2 (two) times daily. 12/08/20 12/08/21 Yes Rudene Re, MD  omeprazole (PRILOSEC) 40 MG capsule Take by mouth. 12/06/15  Yes [provider]  oxybutynin (DITROPAN-XL) 5 MG 24 hr tablet Take 5 mg by mouth daily. 08/09/20  Yes [provider]  ACCU-CHEK SOFTCLIX LANCETS lancets CHECK GLUCOSE TWICE DIALY FOR GLUCOSE MONITORING E11.9 06/09/17   [provider]  cephALEXin (KEFLEX) 500 MG capsule Take 1 capsule (500 mg total) by mouth 3 (three) times daily. 11/28/20   Carrie Mew, MD  clotrimazole (LOTRIMIN) 1 % cream APPLY TO AFFECTED AREA TWICE A DAY FOR FUNGAL INFECTION 06/14/17   [provider]  doxycycline (VIBRAMYCIN) 100 MG capsule Take 1 capsule (100 mg total) by mouth 2 (two) times daily. 10/11/20   Margarette [place], NP  glucose blood test strip USE AS DIRECTED TWICE A DAY 07/08/15   [provider]  hydrochlorothiazide (MICROZIDE) 12.5 MG capsule Take 12.5 mg by mouth daily. 07/16/20   [provider]  ondansetron (ZOFRAN ODT) 4 MG disintegrating tablet Take 1 tablet (4 mg total) by mouth every 8 (eight) hours as needed for nausea or vomiting. 11/28/20   Carrie Mew, MD    Allergies Aspirin  Family History  Problem Relation Age of Onset   Breast cancer Cousin     Social History Social History   Tobacco Use   Smoking status: Former    Types: Cigarettes   Smokeless tobacco: Never  Vaping Use   Vaping Use: Every day   Substances: Nicotine, Flavoring  Substance Use Topics   Alcohol use: Never   Drug use: Never     Review of Systems  Constitutional: No fever/chills Eyes:  No discharge ENT: Patient has dry mouth and nasal congestion.  Respiratory: no cough. No SOB/ use of accessory muscles to breath Gastrointestinal:   No nausea, no vomiting.  No diarrhea.  No constipation. Musculoskeletal: Negative for musculoskeletal pain. Skin: Negative for rash, abrasions, lacerations, ecchymosis.    ____________________________________________   PHYSICAL EXAM:  VITAL SIGNS: ED Triage Vitals  Enc Vitals Group     BP 12/18/20 1038 114/78     Pulse Rate 12/18/20 1038 83     Resp 12/18/20 1038 18     Temp 12/18/20 1038 98.2 F (36.8 C)     Temp Source 12/18/20 1038 Oral     SpO2 12/18/20 1038 99 %     Weight 12/18/20 1034 (!) 320 lb (145.2 kg)     Height 12/18/20 1034 5\' 3"  (1.6 m)     Head Circumference --      Peak Flow --      Pain Score 12/18/20 1034 0     Pain Loc --      Pain Edu? --      Excl. in [place]? --      Constitutional: Alert and oriented. Patient is lying supine. Eyes: Conjunctivae are normal. PERRL. EOMI. Head:  Atraumatic. ENT:      Ears: Tympanic membranes are mildly injected with mild effusion bilaterally.       Nose: No congestion/rhinnorhea.      Mouth/Throat: Mucous membranes are moist. Posterior pharynx is mildly erythematous.  Hematological/Lymphatic/Immunilogical: No cervical lymphadenopathy.  Cardiovascular: Normal rate, regular rhythm. Normal S1 and S2.  Good peripheral circulation. Respiratory: Normal respiratory effort without tachypnea or retractions. Lungs CTAB. Good air entry to the bases with no decreased or absent breath sounds. Gastrointestinal: Bowel sounds 4 quadrants. Soft and nontender to palpation. No guarding or rigidity. No palpable masses. No distention. No CVA tenderness. Musculoskeletal: Full range of motion  to all extremities. No gross deformities appreciated. Neurologic:  Normal speech and language. No gross focal neurologic deficits are appreciated.  Skin:  Skin is warm, dry and intact. No rash noted. Psychiatric: Mood and affect are normal. Speech and behavior are normal. Patient exhibits appropriate insight and judgement.   ____________________________________________   LABS (all labs ordered are listed, but only abnormal results are displayed)  Labs Reviewed - No data to display ____________________________________________  EKG   ____________________________________________  RADIOLOGY   No results found.  ____________________________________________    PROCEDURES  Procedure(s) performed:     ED EKG  Date/Time: 12/18/2020 11:28 AM Performed by: Lannie Fields, PA-C Authorized by: Vallarie Mare M, PA-C       Medications - No data to display   ____________________________________________   INITIAL IMPRESSION / ASSESSMENT AND PLAN / ED COURSE  Pertinent labs & imaging results that were available during my care of the patient were reviewed by me and considered in my medical decision making (see chart for details).       Assessment and Plan:  Nasal congestion 50 year old female presents to the urgent care with nasal congestion for the past 2 to 3 days.  She reports that she has been sleeping with her mouth open and has had dry mouth.  She was recently diagnosed with A. fib and states that she has had some generalized chest soreness since being discharged from the emergency department but no new chest pain, chest tightness or shortness of breath.  EKG obtained during this urgent care encounter does indicate atrial fibrillation and a stable rate.  Patient has a follow-up appointment with her cardiologist on Thursday and I recommended keeping this appointment.  I recommended that patient seek care at the local emergency department, [facility] if she experiences worsening chest pain, chest tightness or shortness of breath.  She voiced understanding regarding these recommendations and has easy access to the emergency department should symptoms change or worsen.   ____________________________________________  FINAL CLINICAL IMPRESSION(S) / ED DIAGNOSES  Final diagnoses:  Nasal congestion      NEW MEDICATIONS STARTED DURING THIS VISIT:  ED Discharge Orders          Ordered    fluticasone (FLONASE) 50 MCG/ACT nasal spray  Daily        12/18/20 1143                This chart was dictated using voice recognition software/Dragon. Despite best efforts to proofread, errors can occur which can change the meaning. Any change was purely unintentional.     Lannie Fields, PA-C 12/18/20 1152

## 2020-12-21 ENCOUNTER — Other Ambulatory Visit: Payer: Self-pay | Admitting: Student

## 2020-12-21 DIAGNOSIS — G4733 Obstructive sleep apnea (adult) (pediatric): Secondary | ICD-10-CM

## 2020-12-21 DIAGNOSIS — I480 Paroxysmal atrial fibrillation: Secondary | ICD-10-CM

## 2020-12-21 DIAGNOSIS — I350 Nonrheumatic aortic (valve) stenosis: Secondary | ICD-10-CM

## 2020-12-23 ENCOUNTER — Ambulatory Visit: Admission: RE | Admit: 2020-12-23 | Payer: Medicare Other | Source: Ambulatory Visit

## 2020-12-30 ENCOUNTER — Ambulatory Visit
Admission: RE | Admit: 2020-12-30 | Discharge: 2020-12-30 | Disposition: A | Discharge status: Home / Self Care | Payer: Medicare Other | Attending: Internal Medicine | Admitting: Internal Medicine

## 2020-12-30 ENCOUNTER — Ambulatory Visit: Payer: Medicare Other | Admitting: Anesthesiology

## 2020-12-30 ENCOUNTER — Encounter: Payer: Self-pay | Admitting: Internal Medicine

## 2020-12-30 ENCOUNTER — Encounter
Admission: RE | Disposition: A | Discharge status: Home / Self Care | Payer: Self-pay | Source: Home / Self Care | Attending: Internal Medicine

## 2020-12-30 ENCOUNTER — Other Ambulatory Visit: Payer: Self-pay

## 2020-12-30 DIAGNOSIS — Z794 Long term (current) use of insulin: Secondary | ICD-10-CM | POA: Insufficient documentation

## 2020-12-30 DIAGNOSIS — E119 Type 2 diabetes mellitus without complications: Secondary | ICD-10-CM | POA: Diagnosis not present

## 2020-12-30 DIAGNOSIS — I480 Paroxysmal atrial fibrillation: Secondary | ICD-10-CM | POA: Diagnosis present

## 2020-12-30 DIAGNOSIS — Z87891 Personal history of nicotine dependence: Secondary | ICD-10-CM | POA: Diagnosis not present

## 2020-12-30 DIAGNOSIS — Z7984 Long term (current) use of oral hypoglycemic drugs: Secondary | ICD-10-CM | POA: Diagnosis not present

## 2020-12-30 DIAGNOSIS — Z79899 Other long term (current) drug therapy: Secondary | ICD-10-CM | POA: Diagnosis not present

## 2020-12-30 HISTORY — PX: CARDIOVERSION: SHX1299

## 2020-12-30 LAB — GLUCOSE, CAPILLARY: Glucose-Capillary: 155 mg/dL — ABNORMAL HIGH (ref 70–99)

## 2020-12-30 SURGERY — CARDIOVERSION
Anesthesia: General

## 2020-12-30 MED ORDER — PROPOFOL 10 MG/ML IV BOLUS
INTRAVENOUS | Status: DC | PRN
  Administered 2020-12-30: 30 mg via INTRAVENOUS
  Administered 2020-12-30: 60 mg via INTRAVENOUS

## 2020-12-30 MED ORDER — PHENYLEPHRINE HCL-NACL 20-0.9 MG/250ML-% IV SOLN
INTRAVENOUS | Status: AC
  Filled 2020-12-30: qty 250

## 2020-12-30 MED ORDER — SODIUM CHLORIDE 0.9 % IV SOLN: INTRAVENOUS | Status: DC | PRN

## 2020-12-30 MED ORDER — PROPOFOL 500 MG/50ML IV EMUL
INTRAVENOUS | Status: AC
  Filled 2020-12-30: qty 50

## 2020-12-30 MED ORDER — PROPOFOL 10 MG/ML IV BOLUS
INTRAVENOUS | Status: AC
  Filled 2020-12-30: qty 20

## 2020-12-30 NOTE — Transfer of Care (Signed)
Immediate Anesthesia Transfer of Care Note  Patient: Joan Graves  Procedure(s) Performed: CARDIOVERSION  Patient Location: Special Procedures  Anesthesia Type:General  Level of Consciousness: drowsy  Airway & Oxygen Therapy: Patient Spontanous Breathing and Patient connected to nasal cannula oxygen  Post-op Assessment: Report given to RN and Post -op Vital signs reviewed and stable  Post vital signs: Reviewed and stable  Last Vitals:  Vitals Value Taken Time  BP 86/39 12/30/20 0845  Temp    Pulse 42 12/30/20 0848  Resp 21 12/30/20 0848  SpO2 99 % 12/30/20 0848    Last Pain:  Vitals:   12/30/20 0827  TempSrc: Oral  PainSc: 5          Complications: No notable events documented.

## 2020-12-30 NOTE — CV Procedure (Signed)
Electrical Cardioversion Procedure Note MIRYAH RALLS [id number] 12-15-1970  Procedure: Electrical Cardioversion Indications:  Paroxysmal non valvular atrial fibrillation  Procedure Details Consent: Risks of procedure as well as the alternatives and risks of each were explained to the (patient/caregiver).  Consent for procedure obtained. Time Out: Verified patient identification, verified procedure, site/side was marked, verified correct patient position, special equipment/implants available, medications/allergies/relevent history reviewed, required imaging and test results available.  Performed  Patient placed on cardiac monitor, pulse oximetry, supplemental oxygen as necessary.  Sedation given: Propofol and versed as per anesthesia  Pacer pads placed anterior and posterior chest.  Cardioverted 1 time(s).  Cardioverted at 120J.  Evaluation Findings: Post procedure EKG shows: NSR Complications: None Patient did tolerate procedure well.   Serafina Royals M.D. [facility] 12/30/2020, 8:42 AM

## 2020-12-30 NOTE — Anesthesia Preprocedure Evaluation (Addendum)
Anesthesia Evaluation  Patient identified by MRN, date of birth, ID band Patient awake    Reviewed: Allergy & Precautions, NPO status , Patient's Chart, lab work & pertinent test results  Airway Mallampati: III  TM Distance: >3 FB Neck ROM: Full    Dental  (+) Poor Dentition   Pulmonary shortness of breath and with exertion, asthma , sleep apnea , former smoker,     + decreased breath sounds      Cardiovascular Exercise Tolerance: Poor hypertension, Pt. on medications + DOE  + Valvular Problems/Murmurs ( Mild) MR  Rhythm:Irregular Rate:Normal  ECHO 12/15/2020: 1. Left ventricular ejection fraction, by estimation, is 55 to 60%. The  left ventricle has normal function. The left ventricle has no regional  wall motion abnormalities. Left ventricular diastolic parameters were  normal.  2. Right ventricular systolic function is normal. The right ventricular  size is normal.  3. Left atrial size was mildly dilated.  4. Right atrial size was mildly dilated.  5. The mitral valve is normal in structure. Mild mitral valve  regurgitation.  6. The aortic valve is normal in structure. Aortic valve regurgitation is  trivial. Mild aortic valve stenosis.   Atypical Chest pain   Neuro/Psych PSYCHIATRIC DISORDERS Anxiety Depression    GI/Hepatic GERD  Controlled and Medicated,  Endo/Other  diabetes, Type 2, Oral Hypoglycemic Agents, Insulin DependentMorbid obesity  Renal/GU   negative genitourinary   Musculoskeletal negative musculoskeletal ROS (+)   Abdominal (+) + obese,   Peds  Hematology negative hematology ROS (+)   Anesthesia Other Findings CT ANGIO CHEST 11/2020: 1. No CT evidence of pulmonary embolism or other acute intrathoracic process. 2. Diffuse fatty infiltration of the liver  Carotid Ultrasound: IMPRESSION: Unremarkable carotid Doppler ultrasound  Reproductive/Obstetrics negative OB ROS                             Anesthesia Physical Anesthesia Plan  ASA: 4  Anesthesia Plan: General   Post-op Pain Management:    Induction: Intravenous  PONV Risk Score and Plan: Treatment may vary due to age or medical condition  Airway Management Planned: Nasal Cannula and Natural Airway  Additional Equipment:   Intra-op Plan:   Post-operative Plan:   Informed Consent: I have reviewed the patients History and Physical, chart, labs and discussed the procedure including the risks, benefits and alternatives for the proposed anesthesia with the patient or authorized representative who has indicated his/her understanding and acceptance.     Dental advisory given  Plan Discussed with: CRNA and Anesthesiologist  Anesthesia Plan Comments:         Anesthesia Quick Evaluation

## 2020-12-30 NOTE — Anesthesia Preprocedure Evaluation (Deleted)
Anesthesia Evaluation  Patient identified by MRN, date of birth, ID band Patient awake    Reviewed: Allergy & Precautions, NPO status , Patient's Chart, lab work & pertinent test results  Airway        Dental   Pulmonary asthma , sleep apnea , former smoker,           Cardiovascular hypertension, Pt. on medications + dysrhythmias Atrial Fibrillation   ECHO 11/2020: ventricular ejection fraction, by estimation, is 55 to 60%. The  left ventricle has normal function. The left ventricle has no regional  wall motion abnormalities. Left ventricular diastolic parameters were  normal.  2. Right ventricular systolic function is normal. The right ventricular  size is normal.  3. Left atrial size was mildly dilated.  4. Right atrial size was mildly dilated.  5. The mitral valve is normal in structure. Mild mitral valve  regurgitation.  6. The aortic valve is normal in structure. Aortic valve regurgitation is  trivial. Mild aortic valve stenosis.   Neuro/Psych PSYCHIATRIC DISORDERS Anxiety Depression negative neurological ROS     GI/Hepatic Neg liver ROS, GERD  ,  Endo/Other  negative endocrine ROSdiabetes, Well Controlled, Oral Hypoglycemic Agents, Insulin Dependent  Renal/GU negative Renal ROS  negative genitourinary   Musculoskeletal negative musculoskeletal ROS (+)   Abdominal   Peds negative pediatric ROS (+)  Hematology negative hematology ROS (+)   Anesthesia Other Findings   Reproductive/Obstetrics negative OB ROS                            Anesthesia Physical Anesthesia Plan Anesthesia Quick Evaluation

## 2020-12-31 ENCOUNTER — Encounter: Payer: Self-pay | Admitting: Internal Medicine

## 2020-12-31 NOTE — Anesthesia Postprocedure Evaluation (Signed)
Anesthesia Post Note  Patient: Joan Graves  Procedure(s) Performed: CARDIOVERSION  Patient location: cardiovascular suite. Anesthesia Type: General Level of consciousness: awake and alert Pain management: pain level controlled Vital Signs Assessment: post-procedure vital signs reviewed and stable Respiratory status: spontaneous breathing, nonlabored ventilation and respiratory function stable Cardiovascular status: blood pressure returned to baseline and stable Postop Assessment: no apparent nausea or vomiting Anesthetic complications: no   No notable events documented.   Last Vitals:  Vitals:   12/30/20 0915 12/30/20 0930  BP: 97/62 106/67  Pulse: (!) 43 (!) 44  Resp: 15 15  Temp:    SpO2: 100% 98%    Last Pain:  Vitals:   12/30/20 0827  TempSrc: Oral  PainSc: [place]

## 2021-01-05 ENCOUNTER — Other Ambulatory Visit: Payer: Self-pay

## 2021-01-05 ENCOUNTER — Encounter
Admission: RE | Admit: 2021-01-05 | Discharge: 2021-01-05 | Disposition: A | Discharge status: Home / Self Care | Payer: Medicare Other | Source: Ambulatory Visit | Attending: Student | Admitting: Student

## 2021-01-05 DIAGNOSIS — R001 Bradycardia, unspecified: Secondary | ICD-10-CM | POA: Insufficient documentation

## 2021-01-05 DIAGNOSIS — I480 Paroxysmal atrial fibrillation: Secondary | ICD-10-CM | POA: Diagnosis present

## 2021-01-05 MED ORDER — REGADENOSON 0.4 MG/5ML IV SOLN
0.4000 mg | Freq: Once | INTRAVENOUS | Status: AC
  Administered 2021-01-05: 0.4 mg via INTRAVENOUS
  Filled 2021-01-05: qty 5

## 2021-01-05 MED ORDER — TECHNETIUM TC 99M TETROFOSMIN IV KIT
30.0000 | PACK | Freq: Once | INTRAVENOUS | Status: AC | PRN
  Administered 2021-01-05: 31.63 via INTRAVENOUS

## 2021-01-06 ENCOUNTER — Encounter
Admission: RE | Admit: 2021-01-06 | Discharge: 2021-01-06 | Disposition: A | Discharge status: Home / Self Care | Payer: Medicare Other | Source: Ambulatory Visit | Attending: Student | Admitting: Student

## 2021-01-06 DIAGNOSIS — I480 Paroxysmal atrial fibrillation: Secondary | ICD-10-CM | POA: Insufficient documentation

## 2021-01-06 MED ORDER — TECHNETIUM TC 99M TETROFOSMIN IV KIT
30.0000 | PACK | Freq: Once | INTRAVENOUS | Status: AC
  Administered 2021-01-06: 32.4 via INTRAVENOUS

## 2021-02-01 ENCOUNTER — Other Ambulatory Visit: Payer: Self-pay | Admitting: Physician Assistant

## 2021-02-02 ENCOUNTER — Other Ambulatory Visit: Payer: Self-pay | Admitting: Internal Medicine

## 2021-02-02 ENCOUNTER — Other Ambulatory Visit: Payer: Self-pay | Admitting: Student

## 2021-02-02 DIAGNOSIS — I480 Paroxysmal atrial fibrillation: Secondary | ICD-10-CM

## 2021-02-02 LAB — NM MYOCAR MULTI W/SPECT W/WALL MOTION / EF
Estimated workload: 1
Exercise duration (min): 1 min
Exercise duration (sec): 1 s
LV dias vol: 112 mL (ref 46–106)
LV sys vol: 50 mL
MPHR: 171 {beats}/min
Nuc Stress EF: 55 %
Peak HR: 60 {beats}/min
Percent HR: 35 %
Rest HR: 42 {beats}/min
Rest Nuclear Isotope Dose: 32.4 mCi
SDS: 4
SRS: 6
SSS: 3
ST Depression (mm): 0 mm
Stress Nuclear Isotope Dose: 31.6 mCi
TID: 0.94

## 2021-02-04 ENCOUNTER — Other Ambulatory Visit: Payer: Self-pay | Admitting: Student

## 2021-02-04 DIAGNOSIS — I480 Paroxysmal atrial fibrillation: Secondary | ICD-10-CM

## 2021-02-10 ENCOUNTER — Other Ambulatory Visit: Payer: Medicare Other

## 2021-02-11 ENCOUNTER — Other Ambulatory Visit: Payer: Medicare Other

## 2021-04-01 ENCOUNTER — Ambulatory Visit
Admission: EM | Admit: 2021-04-01 | Discharge: 2021-04-01 | Disposition: A | Discharge status: Home / Self Care | Payer: Medicare Other | Attending: Emergency Medicine | Admitting: Emergency Medicine

## 2021-04-01 ENCOUNTER — Telehealth: Payer: Self-pay

## 2021-04-01 ENCOUNTER — Other Ambulatory Visit: Payer: Self-pay

## 2021-04-01 DIAGNOSIS — R1310 Dysphagia, unspecified: Secondary | ICD-10-CM

## 2021-04-01 NOTE — Telephone Encounter (Signed)
Scheduled for 05/27/2021

## 2021-04-01 NOTE — ED Provider Notes (Signed)
[facility]    CSN: [id number] Arrival date & time: 04/01/21  1335      History   Chief Complaint No chief complaint on file.   HPI Joan Graves is a 51 y.o. female.   HPI  51 year old female here for evaluation of difficulty swallowing.  Patient reports that for last 6 months she has been having difficulty swallowing solid foods.  She states that the feeling that becomes stuck in chest after coughing out or use her finger to fish it out because it will not pass.  She states that she mentioned this to her PCP 2 months ago and was told that it may possibly be a cold.  Patient has been waking up in the morning hoarse.  She does have a history of reflux disease but is taking omeprazole daily.  She has never seen gastroenterology or had an endoscopy.  She denies any sour taste in her mouth or burning in her throat.  Past Medical History:  Diagnosis Date   Anemia    Anxiety    Asthma    Depression    Diabetes mellitus without complication (HCC)    GERD (gastroesophageal reflux disease)    Hypercholesteremia    Hypertension    Sleep apnea     There are no problems to display for this patient.   Past Surgical History:  Procedure Laterality Date   CARDIOVERSION N/A 12/30/2020   Procedure: CARDIOVERSION;  Surgeon: Corey Skains, MD;  Location: [facility] ORS;  Service: Cardiovascular;  Laterality: N/A;   TUBAL LIGATION      OB History     Gravida  1   Para      Term      Preterm      AB      Living         SAB      IAB      Ectopic      Multiple      Live Births               Home Medications    Prior to Admission medications   Medication Sig Start Date End Date Taking? Authorizing Provider  ACCU-CHEK SOFTCLIX LANCETS lancets CHECK GLUCOSE TWICE DIALY FOR GLUCOSE MONITORING E11.9 06/09/17   [provider]  ADVAIR DISKUS 500-50 MCG/DOSE AEPB 1 DOSE INHALED TWICE DAILY FOR ASTHMA, RINSE MOUTH OUT WITH WATER AFTE USE 04/24/17    [provider]  albuterol (VENTOLIN HFA) 108 (90 Base) MCG/ACT inhaler Inhale into the lungs. Patient not taking: Reported on 12/30/2020    [provider]  amiodarone (PACERONE) 200 MG tablet Take 200 mg by mouth 2 (two) times daily. 12/09/20   [provider]  amLODipine (NORVASC) 10 MG tablet Take 1 tablet by mouth daily. 10/17/18   [provider]  apixaban (ELIQUIS) 5 MG TABS tablet Take 1 tablet (5 mg total) by mouth 2 (two) times daily. 12/08/20 01/07/21  Rudene Re, MD  atorvastatin (LIPITOR) 20 MG tablet Take 1 tablet by mouth daily. 10/04/19   [provider]  buPROPion (WELLBUTRIN SR) 100 MG 12 hr tablet Take 100 mg by mouth 2 (two) times daily. 08/10/20   [provider]  clotrimazole (LOTRIMIN) 1 % cream APPLY TO AFFECTED AREA TWICE A DAY FOR FUNGAL INFECTION Patient not taking: Reported on 12/30/2020 06/14/17   [provider]  enalapril (VASOTEC) 20 MG tablet Take 2 tablets by mouth daily. 02/13/20   [provider]  ferrous sulfate 325 (65 FE) MG EC tablet 2 tabletsBY MOUTH  A DAY FOR ANEMIA 11/30/15   [provider]  fluticasone (FLONASE) 50 MCG/ACT nasal spray Place 1 spray into both nostrils daily for 7 days. 12/18/20 12/30/20  Lannie Fields, PA-C  glipiZIDE (GLUCOTROL XL) 10 MG 24 hr tablet Take only tablet by mouth daily for diabetes 08/14/15   [provider]  glucose blood test strip USE AS DIRECTED TWICE A DAY 07/08/15   [provider]  Insulin Glargine (BASAGLAR KWIKPEN) 100 UNIT/ML SMARTSIG:65 Unit(s) SUB-Q Every Night 07/16/20   [provider]  JARDIANCE 25 MG TABS tablet Take 25 mg by mouth daily. 07/16/20   [provider]  metFORMIN (GLUCOPHAGE-XR) 500 MG 24 hr tablet 2 BY MOUTH TWICE DAILY FOR DIABETES 05/26/17   [provider]  metoprolol tartrate (LOPRESSOR) 25 MG tablet Take 0.5 tablets (12.5 mg total) by mouth 2 (two) times daily. 12/08/20  12/08/21  Rudene Re, MD  oxybutynin (DITROPAN-XL) 5 MG 24 hr tablet Take 5 mg by mouth daily. 08/09/20   [provider]    Family History Family History  Problem Relation Age of Onset   Breast cancer Cousin     Social History Social History   Tobacco Use   Smoking status: Former    Types: Cigarettes   Smokeless tobacco: Never  Vaping Use   Vaping Use: Every day   Substances: Nicotine, Flavoring  Substance Use Topics   Alcohol use: Never   Drug use: Never     Allergies   Aspirin   Review of Systems Review of Systems  Constitutional:  Negative for fever.  HENT:  Positive for trouble swallowing. Negative for sore throat.   Hematological: Negative.   Psychiatric/Behavioral: Negative.      Physical Exam Triage Vital Signs ED Triage Vitals  Enc Vitals Group     BP 04/01/21 1348 118/67     Pulse Rate 04/01/21 1348 60     Resp 04/01/21 1348 16     Temp 04/01/21 1348 98 F (36.7 C)     Temp Source 04/01/21 1348 Oral     SpO2 04/01/21 1348 100 %     Weight --      Height --      Head Circumference --      Peak Flow --      Pain Score 04/01/21 1347 0     Pain Loc --      Pain Edu? --      Excl. in [place]? --    No data found.  Updated Vital Signs BP 118/67 (BP Location: Left Arm)    Pulse 60    Temp 98 F (36.7 C) (Oral)    Resp 16    LMP 10/06/2017 (Exact Date)    SpO2 100%   Visual Acuity Right Eye Distance:   Left Eye Distance:   Bilateral Distance:    Right Eye Near:   Left Eye Near:    Bilateral Near:     Physical Exam Vitals and nursing note reviewed.  Constitutional:      General: She is not in acute distress.    Appearance: Normal appearance. She is normal weight. She is not ill-appearing.  HENT:     Head: Normocephalic and atraumatic.     Mouth/Throat:     Mouth: Mucous membranes are moist.     Pharynx: Oropharynx is clear. No oropharyngeal exudate or posterior oropharyngeal erythema.  Cardiovascular:  Rate and Rhythm:  Normal rate and regular rhythm.     Pulses: Normal pulses.     Heart sounds: Normal heart sounds. No murmur heard.   No friction rub. No gallop.  Pulmonary:     Effort: Pulmonary effort is normal.     Breath sounds: Normal breath sounds. No stridor. No wheezing, rhonchi or rales.  Musculoskeletal:     Cervical back: Normal range of motion and neck supple.  Lymphadenopathy:     Cervical: No cervical adenopathy.  Skin:    General: Skin is warm and dry.     Capillary Refill: Capillary refill takes less than 2 seconds.     Findings: No erythema or rash.  Neurological:     General: No focal deficit present.     Mental Status: She is alert and oriented to person, place, and time.  Psychiatric:        Mood and Affect: Mood normal.        Behavior: Behavior normal.        Thought Content: Thought content normal.        Judgment: Judgment normal.     UC Treatments / Results  Labs (all labs ordered are listed, but only abnormal results are displayed) Labs Reviewed - No data to display  EKG   Radiology No results found.  Procedures Procedures (including critical care time)  Medications Ordered in UC Medications - No data to display  Initial Impression / Assessment and Plan / UC Course  I have reviewed the triage vital signs and the nursing notes.  Pertinent labs & imaging results that were available during my care of the patient were reviewed by me and considered in my medical decision making (see chart for details).  Patient is a nontoxic-appearing 51 year old female here for evaluation of difficulty swallowing that is been ongoing for the past 6 months.  She states that it does not matter what type of food she eats solid foods will often become stuck and she has to either cough the food out or get herself with her finger to get it to come out.  She does have a history of reflux, as mentioned in the HPI above, and states that she is taking omeprazole daily for that.  However,  she is still waking up hoarse many mornings but she denies any sour taste in her mouth or burning in her throat.  On physical exam patient's oropharyngeal exam is benign.  There is no erythema or injection appreciated in the posterior oropharynx.  No cervical lymphadenopathy appreciated exam.  I do not feel any thyroid megaly or masses in the neck.  No stridor when auscultating over the trachea.  Lung sounds are clear to auscultation all fields.  I believe the patient is experiencing esophagitis, possibly secondary to her reflux disease.  I have encouraged her to continue her omeprazole, eat small bites, chew them thoroughly, and swallow them with liquid to prevent food from becoming stuck, and I am referring her to [facility] for further evaluation.   Final Clinical Impressions(s) / UC Diagnoses   Final diagnoses:  Dysphagia, unspecified type     Discharge Instructions      Continue your Omeprazole.  Make sure that you are eating small bites, chewing well, and swallowing with liquid.  I have referred you to [facility] for further evaluation.  If you feel like food gets stuck, or you feel choked, you need to go to the ER.      ED  Prescriptions   None    PDMP not reviewed this encounter.   Margarette Canada, NP 04/01/21 1436

## 2021-04-01 NOTE — ED Triage Notes (Signed)
Patient presents to Urgent Care with complaints of a throat problem. Pt states she has been having trouble swallowing and her food has been getting stuck. Pt reports she mentioned her concern 2 months ago to her PCP and was told it was most likely related to a cold.   Denies SOB.

## 2021-04-01 NOTE — Discharge Instructions (Signed)
Continue your Omeprazole.  Make sure that you are eating small bites, chewing well, and swallowing with liquid.  I have referred you to [facility] for further evaluation.  If you feel like food gets stuck, or you feel choked, you need to go to the ER.

## 2021-05-03 ENCOUNTER — Other Ambulatory Visit: Payer: Self-pay

## 2021-05-03 ENCOUNTER — Encounter: Payer: Self-pay | Admitting: Urology

## 2021-05-03 ENCOUNTER — Ambulatory Visit (INDEPENDENT_AMBULATORY_CARE_PROVIDER_SITE_OTHER): Payer: Medicare Other | Admitting: Urology

## 2021-05-03 ENCOUNTER — Ambulatory Visit: Payer: Self-pay

## 2021-05-03 VITALS — BP 129/68 | HR 55 | Ht 63.0 in | Wt 315.0 lb

## 2021-05-03 DIAGNOSIS — R32 Unspecified urinary incontinence: Secondary | ICD-10-CM

## 2021-05-03 LAB — MICROSCOPIC EXAMINATION
Bacteria, UA: NONE SEEN
Epithelial Cells (non renal): NONE SEEN /hpf (ref 0–10)

## 2021-05-03 LAB — URINALYSIS, COMPLETE
Bilirubin, UA: NEGATIVE
Ketones, UA: NEGATIVE
Leukocytes,UA: NEGATIVE
Nitrite, UA: NEGATIVE
Protein,UA: NEGATIVE
RBC, UA: NEGATIVE
Specific Gravity, UA: 1.02 (ref 1.005–1.030)
Urobilinogen, Ur: 0.2 mg/dL (ref 0.2–1.0)
pH, UA: 6 (ref 5.0–7.5)

## 2021-05-03 NOTE — Progress Notes (Signed)
In and Out Catheterization ? ?Patient is present today for a I & O catheterization due to urinary frequency. Patient was cleaned and prepped in a sterile fashion with betadine . A 14FR cath was inserted no complications were noted , 68m of urine return was noted, urine was yellow in color. A clean urine sample was collected for UA. Bladder was drained  And catheter was removed with out difficulty.   ? ?Performed by: CElberta Leatherwood CMA  ?

## 2021-05-03 NOTE — Progress Notes (Signed)
05/03/2021 8:45 AM   Joan Graves [birth year] [id number]  Referring provider: [facility] [address] [place],  [place] [address]  Chief Complaint  Patient presents with   Urinary Incontinence    HPI: Joan Graves is a 51 y.o. female referred for evaluation of urinary incontinence.  Complains of urinary incontinence since 2008 Denies frequency urgency or urge incontinence Denies loss of urine with coughing, laughing, sneezing States after voiding she has constant urinary leakage No previous vaginal/urologic surgery No prior evaluation or for her incontinence Has been on oxybutynin in the past which was not effective  PMH: Past Medical History:  Diagnosis Date   Anemia    Anxiety    Asthma    Depression    Diabetes mellitus without complication (HCC)    GERD (gastroesophageal reflux disease)    Hypercholesteremia    Hypertension    Sleep apnea     Surgical History: Past Surgical History:  Procedure Laterality Date   CARDIOVERSION N/A 12/30/2020   Procedure: CARDIOVERSION;  Surgeon: Lamar Blinks, MD;  Location: [facility] ORS;  Service: Cardiovascular;  Laterality: N/A;   TUBAL LIGATION      Home Medications:  Allergies as of 05/03/2021       Reactions   Aspirin Diarrhea   "high doses" gives her diarrhea        Medication List        Accurate as of May 03, 2021  8:45 AM. If you have any questions, ask your nurse or doctor.          STOP taking these medications    oxybutynin 5 MG 24 hr tablet Commonly known as: DITROPAN-XL Stopped by: Riki Altes, MD       TAKE these medications    Accu-Chek Softclix Lancets lancets CHECK GLUCOSE TWICE DIALY FOR GLUCOSE MONITORING E11.9   Advair Diskus 500-50 MCG/ACT Aepb Generic drug: fluticasone-salmeterol 1 DOSE INHALED TWICE DAILY FOR ASTHMA, RINSE MOUTH OUT WITH WATER AFTE USE   albuterol 108 (90 Base) MCG/ACT inhaler Commonly known as: VENTOLIN  HFA Inhale into the lungs.   amiodarone 200 MG tablet Commonly known as: PACERONE Take 200 mg by mouth 2 (two) times daily.   amLODipine 10 MG tablet Commonly known as: NORVASC Take 1 tablet by mouth daily.   apixaban 5 MG Tabs tablet Commonly known as: ELIQUIS Take 1 tablet (5 mg total) by mouth 2 (two) times daily.   atorvastatin 20 MG tablet Commonly known as: LIPITOR Take 1 tablet by mouth daily.   Basaglar KwikPen 100 UNIT/ML SMARTSIG:65 Unit(s) SUB-Q Every Night   buPROPion ER 100 MG 12 hr tablet Commonly known as: WELLBUTRIN SR Take 100 mg by mouth 2 (two) times daily.   clotrimazole 1 % cream Commonly known as: LOTRIMIN APPLY TO AFFECTED AREA TWICE A DAY FOR FUNGAL INFECTION   enalapril 20 MG tablet Commonly known as: VASOTEC Take 2 tablets by mouth daily.   ferrous sulfate 325 (65 FE) MG EC tablet 2 tabletsBY MOUTH  A DAY FOR ANEMIA   fluticasone 50 MCG/ACT nasal spray Commonly known as: FLONASE Place 1 spray into both nostrils daily for 7 days.   glipiZIDE 10 MG 24 hr tablet Commonly known as: GLUCOTROL XL Take only tablet by mouth daily for diabetes   glucose blood test strip USE AS DIRECTED TWICE A DAY   Jardiance 25 MG Tabs tablet Generic drug: empagliflozin Take 25 mg by mouth daily.   metFORMIN 500  MG 24 hr tablet Commonly known as: GLUCOPHAGE-XR 2 BY MOUTH TWICE DAILY FOR DIABETES   metoprolol tartrate 25 MG tablet Commonly known as: LOPRESSOR Take 0.5 tablets (12.5 mg total) by mouth 2 (two) times daily.        Allergies:  Allergies  Allergen Reactions   Aspirin Diarrhea    "high doses" gives her diarrhea    Family History: Family History  Problem Relation Age of Onset   Breast cancer Cousin     Social History:  reports that she has quit smoking. Her smoking use included cigarettes. She has never used smokeless tobacco. She reports that she does not drink alcohol and does not use drugs.   Physical Exam: BP 129/68    Pulse (!) 55   Ht 5\' 3"  (1.6 m)   Wt (!) 315 lb (142.9 kg)   LMP 10/06/2017 (Exact Date)   BMI 55.80 kg/m   Constitutional:  Alert and oriented, No acute distress. HEENT: [place] AT, moist mucus membranes.  Trachea midline, no masses. Cardiovascular: No clubbing, cyanosis, or edema. Respiratory: Normal respiratory effort, no increased work of breathing. Psychiatric: Normal mood and affect.   Assessment & Plan:    1.  Unaware incontinence Bladder scan was not performed due to body habitus She was catheterized and 80 cc of urine was obtained Trial Myrbetriq 50 g daily; PA follow-up 1 month to see if any improvement in symptoms If no improvement consider urodynamic study    Riki Altes, MD  [facility] [address] [place], [place] [address] [phone]

## 2021-05-18 ENCOUNTER — Other Ambulatory Visit: Payer: Self-pay | Admitting: Physician Assistant

## 2021-05-18 DIAGNOSIS — Z1231 Encounter for screening mammogram for malignant neoplasm of breast: Secondary | ICD-10-CM

## 2021-05-27 ENCOUNTER — Ambulatory Visit (INDEPENDENT_AMBULATORY_CARE_PROVIDER_SITE_OTHER): Payer: Medicare Other | Admitting: Gastroenterology

## 2021-05-27 ENCOUNTER — Encounter: Payer: Self-pay | Admitting: Gastroenterology

## 2021-05-27 ENCOUNTER — Telehealth: Payer: Self-pay

## 2021-05-27 VITALS — BP 118/84 | HR 64 | Temp 97.5°F | Ht 63.0 in | Wt 326.0 lb

## 2021-05-27 DIAGNOSIS — R1319 Other dysphagia: Secondary | ICD-10-CM | POA: Diagnosis not present

## 2021-05-27 NOTE — Telephone Encounter (Signed)
Faxed to  ? ?Flossie Dibble, MD   ?[place]   ?[facility] ?

## 2021-05-27 NOTE — Addendum Note (Signed)
Addended by: Lurlean Nanny on: 05/27/2021 02:10 PM ? ? Modules accepted: Orders ? ?

## 2021-05-27 NOTE — Progress Notes (Signed)
? ? ?Gastroenterology Consultation ? ?Referring Provider:     Margarette Canada, NP ?Primary Care Physician:  [facility] [place] ?Primary Gastroenterologist:  Dr. Allen Norris     ?Reason for Consultation:     GERD and dysphagia ?      ? HPI:   ?Joan Graves is a 50 y.o. y/o female referred for consultation & management of GERD and dysphagia by Dr. Domingo Madeira, [place].  This patient comes in today after being seen in the emergency department back in February with a report of trouble swallowing.  The patient had reported that it was worse with solid foods and had been going on for 6 months prior to being seen in the emergency department.  In the emergency department she reported that food would feel like it got stuck in his chest and after coughing she would then use her finger to retrieve the food.  The patient also reported that she would wake up in the morning with a hoarse throat.  She has a history of reflux and has been taking omeprazole daily.  ?She reports it happens every day. She denies any weight loss. Her last colonoscopy was 2007 as she reports it.  The patient denies any black stools or bloody stools.  The patient also denies any abdominal pain. ? ? ?Past Medical History:  ?Diagnosis Date  ? Anemia   ? Anxiety   ? Asthma   ? Depression   ? Diabetes mellitus without complication ([place])   ? GERD (gastroesophageal reflux disease)   ? Hypercholesteremia   ? Hypertension   ? Sleep apnea   ? ? ?Past Surgical History:  ?Procedure Laterality Date  ? CARDIOVERSION N/A 12/30/2020  ? Procedure: CARDIOVERSION;  Surgeon: Corey Skains, MD;  Location: [facility] ORS;  Service: Cardiovascular;  Laterality: N/A;  ? TUBAL LIGATION    ? ? ?Prior to Admission medications   ?Medication Sig Start Date End Date Taking? Authorizing Provider  ?ACCU-CHEK SOFTCLIX LANCETS lancets CHECK GLUCOSE TWICE DIALY FOR GLUCOSE MONITORING E11.9 06/09/17   [provider]  ?ADVAIR DISKUS 500-50 MCG/DOSE AEPB  1 DOSE INHALED TWICE DAILY FOR ASTHMA, RINSE MOUTH OUT WITH WATER AFTE USE 04/24/17   [provider]  ?albuterol (VENTOLIN HFA) 108 (90 Base) MCG/ACT inhaler Inhale into the lungs. ?Patient not taking: Reported on 12/30/2020    [provider]  ?amiodarone (PACERONE) 200 MG tablet Take 200 mg by mouth 2 (two) times daily. 12/09/20   [provider]  ?amLODipine (NORVASC) 10 MG tablet Take 1 tablet by mouth daily. 10/17/18   [provider]  ?apixaban (ELIQUIS) 5 MG TABS tablet Take 1 tablet (5 mg total) by mouth 2 (two) times daily. 12/08/20 01/07/21  Rudene Re, MD  ?atorvastatin (LIPITOR) 20 MG tablet Take 1 tablet by mouth daily. 10/04/19   [provider]  ?buPROPion (WELLBUTRIN SR) 100 MG 12 hr tablet Take 100 mg by mouth 2 (two) times daily. 08/10/20   [provider]  ?clotrimazole (LOTRIMIN) 1 % cream APPLY TO AFFECTED AREA TWICE A DAY FOR FUNGAL INFECTION ?Patient not taking: Reported on 12/30/2020 06/14/17   [provider]  ?enalapril (VASOTEC) 20 MG tablet Take 2 tablets by mouth daily. 02/13/20   [provider]  ?ferrous sulfate 325 (65 FE) MG EC tablet 2 tabletsBY MOUTH  A DAY FOR ANEMIA 11/30/15   [provider]  ?fluticasone (FLONASE) 50 MCG/ACT nasal spray Place 1 spray into both nostrils daily for 7  days. 12/18/20 12/30/20  Lannie Fields, PA-C  ?glipiZIDE (GLUCOTROL XL) 10 MG 24 hr tablet Take only tablet by mouth daily for diabetes 08/14/15   [provider]  ?glucose blood test strip USE AS DIRECTED TWICE A DAY 07/08/15   [provider]  ?Insulin Glargine (BASAGLAR KWIKPEN) 100 UNIT/ML SMARTSIG:65 Unit(s) SUB-Q Every Night 07/16/20   [provider]  ?JARDIANCE 25 MG TABS tablet Take 25 mg by mouth daily. 07/16/20   [provider]  ?metFORMIN (GLUCOPHAGE-XR) 500 MG 24 hr tablet 2 BY MOUTH TWICE DAILY FOR DIABETES 05/26/17   [provider]  ?metoprolol tartrate (LOPRESSOR)  25 MG tablet Take 0.5 tablets (12.5 mg total) by mouth 2 (two) times daily. 12/08/20 12/08/21  Rudene Re, MD  ? ? ?Family History  ?Problem Relation Age of Onset  ? Breast cancer Cousin   ?  ? ?Social History  ? ?Tobacco Use  ? Smoking status: Former  ?  Types: Cigarettes  ? Smokeless tobacco: Never  ?Vaping Use  ? Vaping Use: Every day  ? Substances: Nicotine, Flavoring  ?Substance Use Topics  ? Alcohol use: Never  ? Drug use: Never  ? ? ?Allergies as of 05/27/2021 - Review Complete 05/03/2021  ?Allergen Reaction Noted  ? Aspirin Diarrhea 12/08/2013  ? ? ?Review of Systems:    ?All systems reviewed and negative except where noted in HPI. ? ? Physical Exam:  ?LMP 10/06/2017 (Exact Date)  ?Patient's last menstrual period was 10/06/2017 (exact date). ?General:   Alert,  Well-developed, morbidly obese, pleasant and cooperative in NAD ?Head:  Normocephalic and atraumatic. ?Eyes:  Sclera clear, no icterus.   Conjunctiva pink. ?Ears:  Normal auditory acuity. ?Neck:  Supple; no masses or thyromegaly. ?Lungs:  Respirations even and unlabored.  Clear throughout to auscultation.   No wheezes, crackles, or rhonchi. No acute distress. ?Heart:  Regular rate and rhythm; no murmurs, clicks, rubs, or gallops. ?Abdomen:  Normal bowel sounds.  No bruits.  Soft, non-tender and non-distended without masses, hepatosplenomegaly or hernias noted.  No guarding or rebound tenderness.  Negative Carnett sign.   ?Rectal:  Deferred.  ?Pulses:  Normal pulses noted. ?Extremities:  No clubbing or edema.  No cyanosis. ?Neurologic:  Alert and oriented x3;  grossly normal neurologically. ?Skin:  Intact without significant lesions or rashes.  No jaundice. ?Lymph Nodes:  No significant cervical adenopathy. ?Psych:  Alert and cooperative. Normal mood and affect. ? ?Imaging Studies: ?No results found. ? ?Assessment and Plan:  ? ?Joan Graves is a 51 y.o. y/o female who comes in today with dysphagia to solids more than liquids and reports that  it happens every day.  The patient has been offered an upper endoscopy and since her last colonoscopy was in 2007 she was offered a colonoscopy.  The patient refuses a colonoscopy and has been explained the risks and benefits of a colonoscopy including finding polyps before they are cancer or finding cancer in early stages.  The patient states that she does not want to have a colonoscopy but agrees to being set up for an upper endoscopy.  The patient will follow up at the time of the upper endoscopy.  The patient has been explained the plan and agrees with it. ? ? ? ?Lucilla Lame, MD. [facility] ? ? ? Note: This dictation was prepared with Dragon dictation along with smaller phrase technology. Any transcriptional errors that result from this process are unintentional.   ?

## 2021-06-02 NOTE — Progress Notes (Signed)
06/08/21 ?4:37 PM  ? ?Joan Graves ?12/23/1970 ?[id number] ? ?Referring provider:  ?[facility] ?[place],  [place] [address] ? ?Chief Complaint  ?Patient presents with  ? Over Active Bladder  ? ? ?Urological history: ?Urinary incontinence  ?- Ongoing since 2008; has been on oxybutynin in the past was ineffective  ?- Given trial of Myrbetriq on 05/03/2021 by Dr Bernardo Heater  ?- PVR 0 mL  ? ? ?HPI: ?Joan Graves is a 51 y.o.female who presents today for a 1 month follow-up with PVR.  ? ?She is a difficult historian. ? ?She states the Myrbetriq made no difference what so ever with her urinary issues.  She still has continuous leakage.  She has not noted a pattern to urinary leakage or any exacerbating factors. ? ?Patient denies any modifying or aggravating factors.  Patient denies any gross hematuria, dysuria or suprapubic/flank pain. Patient denies any fevers, chills, nausea or vomiting.  ? ?PMH: ?Past Medical History:  ?Diagnosis Date  ? Anemia   ? Anxiety   ? Asthma   ? Depression   ? Diabetes mellitus without complication ([place])   ? GERD (gastroesophageal reflux disease)   ? Hypercholesteremia   ? Hypertension   ? Sleep apnea   ? ? ?Surgical History: ?Past Surgical History:  ?Procedure Laterality Date  ? CARDIOVERSION N/A 12/30/2020  ? Procedure: CARDIOVERSION;  Surgeon: Corey Skains, MD;  Location: [facility] ORS;  Service: Cardiovascular;  Laterality: N/A;  ? TUBAL LIGATION    ? ? ?Home Medications:  ?Allergies as of 06/03/2021   ? ?   Reactions  ? Aspirin Diarrhea  ? "high doses" gives her diarrhea  ? ?  ? ?  ?Medication List  ?  ? ?  ? Accurate as of June 03, 2021 11:59 PM. If you have any questions, ask your nurse or doctor.  ?  ?  ? ?  ? ?Accu-Chek Softclix Lancets lancets ?CHECK GLUCOSE TWICE DIALY FOR GLUCOSE MONITORING E11.9 ?  ?Advair Diskus 500-50 MCG/ACT Aepb ?Generic drug: fluticasone-salmeterol ?1 DOSE INHALED TWICE DAILY FOR ASTHMA, RINSE  MOUTH OUT WITH WATER AFTE USE ?  ?amiodarone 200 MG tablet ?Commonly known as: PACERONE ?Take 200 mg by mouth 2 (two) times daily. ?  ?amLODipine 10 MG tablet ?Commonly known as: NORVASC ?Take 1 tablet by mouth daily. ?  ?apixaban 5 MG Tabs tablet ?Commonly known as: ELIQUIS ?Take 1 tablet (5 mg total) by mouth 2 (two) times daily. ?  ?atorvastatin 20 MG tablet ?Commonly known as: LIPITOR ?Take 1 tablet by mouth daily. ?  ?Basaglar KwikPen 100 UNIT/ML ?SMARTSIG:65 Unit(s) SUB-Q Every Night ?  ?buPROPion ER 100 MG 12 hr tablet ?Commonly known as: WELLBUTRIN SR ?Take 100 mg by mouth 2 (two) times daily. ?  ?clotrimazole 1 % cream ?Commonly known as: LOTRIMIN ?  ?enalapril 20 MG tablet ?Commonly known as: VASOTEC ?Take 2 tablets by mouth daily. ?  ?ferrous sulfate 325 (65 FE) MG EC tablet ?2 tabletsBY MOUTH  A DAY FOR ANEMIA ?  ?fluticasone 50 MCG/ACT nasal spray ?Commonly known as: FLONASE ?Place 1 spray into both nostrils daily for 7 days. ?  ?glipiZIDE 10 MG 24 hr tablet ?Commonly known as: GLUCOTROL XL ?Take only tablet by mouth daily for diabetes ?  ?glucose blood test strip ?USE AS DIRECTED TWICE A DAY ?  ?Jardiance 25 MG Tabs tablet ?Generic drug: empagliflozin ?Take 25 mg by mouth daily. ?  ?metFORMIN 500 MG 24  hr tablet ?Commonly known as: GLUCOPHAGE-XR ?2 BY MOUTH TWICE DAILY FOR DIABETES ?  ?metoprolol tartrate 25 MG tablet ?Commonly known as: LOPRESSOR ?Take 0.5 tablets (12.5 mg total) by mouth 2 (two) times daily. ?  ? ?  ? ? ?Allergies:  ?Allergies  ?Allergen Reactions  ? Aspirin Diarrhea  ?  "high doses" gives her diarrhea  ? ? ?Family History: ?Family History  ?Problem Relation Age of Onset  ? Breast cancer Cousin   ? ? ?Social History:  reports that she has quit smoking. Her smoking use included cigarettes. She has never used smokeless tobacco. She reports that she does not drink alcohol and does not use drugs. ? ? ?Physical Exam: ?BP 125/77   Pulse 65   Ht '5\' 3"'$  (1.6 m)   Wt (!) 321 lb (145.6 kg)    LMP 10/06/2017 (Exact Date)   BMI 56.86 kg/m?   ?Constitutional:  Alert and oriented, No acute distress. ?HEENT: [place] AT, moist mucus membranes.  Trachea midline, no masses. ?Cardiovascular: No clubbing, cyanosis, or edema. ?Respiratory: Normal respiratory effort, no increased work of breathing. ?Skin: No rashes, bruises or suspicious lesions. ?Neurologic: Grossly intact, no focal deficits, moving all 4 extremities. ?Psychiatric: Normal mood and affect. ? ? ?Laboratory Data: ?Urinalysis ?Component ?    Latest Ref Rng 05/03/2021  ?Glucose, UA ?    Negative  Trace !   ?Specific Gravity, UA ?    1.005 - 1.030  1.020   ?pH, UA ?    5.0 - 7.5  6.0   ?Color, UA ?    Yellow  Yellow   ?Appearance Ur ?    Clear  Hazy !   ?Leukocytes,UA ?    Negative  Negative   ?Protein,UA ?    Negative/Trace  Negative   ?Ketones, UA ?    Negative  Negative   ?RBC, UA ?    Negative  Negative   ?Bilirubin, UA ?    Negative  Negative   ?Urobilinogen, Ur ?    0.2 - 1.0 mg/dL 0.2   ?Nitrite, UA ?    Negative  Negative   ?Microscopic Examination See below:   ? ?Component ?    Latest Ref Rng 05/03/2021  ?WBC, UA ?    0 - 5 /hpf 0-5   ?Bacteria, UA ?    None seen/Few  None seen   ?RBC ?    0 - 2 /hpf 0-2   ?Epithelial Cells (non renal) ?    0 - 10 /hpf None seen   ?I have reviewed the labs.  ? ? ?Pertinent Imaging: ?Results for orders placed or performed in visit on 06/03/21  ?Bladder Scan (Post Void Residual) in office  ?Result Value Ref Range  ? Scan Result 74m   ? ? ?Assessment & Plan:   ? ?1. Urinary incontinence ?- PVR 0 ml; she is emptying adequately  ?- She has failed oxybutynin and Myrbetriq ?- at this time we will move forward with urodynamics ?- We discussed urodynamics in detail today ?- She is agreeable to undergo. ?- Referral sent to Urology  ? ?Return for follow up with Dr. SBernardo Heaterfor UDS report . ? ?[place]?[address] ?[place] [address]?[phone]? ?I,Kailey Littlejohn,acting  as a sEducation administratorfor [employer] PA-C.,have documented all relevant documentation on the behalf of [place] PA-C,as directed by  [facility] PA-C while in the presence of Merdith Adan, PA-C. ?

## 2021-06-03 ENCOUNTER — Ambulatory Visit (INDEPENDENT_AMBULATORY_CARE_PROVIDER_SITE_OTHER): Payer: Medicare Other | Admitting: Urology

## 2021-06-03 VITALS — BP 125/77 | HR 65 | Ht 63.0 in | Wt 321.0 lb

## 2021-06-03 DIAGNOSIS — R32 Unspecified urinary incontinence: Secondary | ICD-10-CM | POA: Diagnosis not present

## 2021-06-03 LAB — BLADDER SCAN AMB NON-IMAGING

## 2021-06-08 ENCOUNTER — Encounter: Payer: Self-pay | Admitting: Urology

## 2021-06-11 ENCOUNTER — Ambulatory Visit
Admission: EM | Admit: 2021-06-11 | Discharge: 2021-06-11 | Disposition: A | Discharge status: Home / Self Care | Payer: Medicare Other | Attending: Physician Assistant | Admitting: Physician Assistant

## 2021-06-11 DIAGNOSIS — Z20822 Contact with and (suspected) exposure to covid-19: Secondary | ICD-10-CM | POA: Diagnosis not present

## 2021-06-11 DIAGNOSIS — E782 Mixed hyperlipidemia: Secondary | ICD-10-CM | POA: Insufficient documentation

## 2021-06-11 DIAGNOSIS — I272 Pulmonary hypertension, unspecified: Secondary | ICD-10-CM | POA: Diagnosis not present

## 2021-06-11 DIAGNOSIS — B349 Viral infection, unspecified: Secondary | ICD-10-CM | POA: Diagnosis not present

## 2021-06-11 DIAGNOSIS — I10 Essential (primary) hypertension: Secondary | ICD-10-CM | POA: Insufficient documentation

## 2021-06-11 DIAGNOSIS — Z7901 Long term (current) use of anticoagulants: Secondary | ICD-10-CM | POA: Diagnosis not present

## 2021-06-11 DIAGNOSIS — J029 Acute pharyngitis, unspecified: Secondary | ICD-10-CM | POA: Diagnosis not present

## 2021-06-11 DIAGNOSIS — E119 Type 2 diabetes mellitus without complications: Secondary | ICD-10-CM | POA: Insufficient documentation

## 2021-06-11 DIAGNOSIS — J45909 Unspecified asthma, uncomplicated: Secondary | ICD-10-CM | POA: Diagnosis not present

## 2021-06-11 DIAGNOSIS — I480 Paroxysmal atrial fibrillation: Secondary | ICD-10-CM | POA: Insufficient documentation

## 2021-06-11 DIAGNOSIS — R051 Acute cough: Secondary | ICD-10-CM | POA: Insufficient documentation

## 2021-06-11 DIAGNOSIS — R439 Unspecified disturbances of smell and taste: Secondary | ICD-10-CM

## 2021-06-11 LAB — RESP PANEL BY RT-PCR (FLU A&B, COVID) ARPGX2
Influenza A by PCR: NEGATIVE
Influenza B by PCR: NEGATIVE
SARS Coronavirus 2 by RT PCR: NEGATIVE

## 2021-06-11 MED ORDER — IPRATROPIUM BROMIDE 0.06 % NA SOLN
2.0000 | Freq: Four times a day (QID) | NASAL | 0 refills | Status: DC
Start: 2021-06-11 — End: 2021-11-15

## 2021-06-11 MED ORDER — PROMETHAZINE-DM 6.25-15 MG/5ML PO SYRP: 5.0000 mL | ORAL_SOLUTION | Freq: Four times a day (QID) | ORAL | 0 refills | Status: DC | PRN

## 2021-06-11 NOTE — ED Provider Notes (Signed)
?[facility] ? ? ? ?CSN: [id number] ?Arrival date & time: 06/11/21  0907 ? ? ?  ? ?History   ?Chief Complaint ?Chief Complaint  ?Patient presents with  ? Sore Throat  ? Shortness of Breath  ? ? ?HPI ?Joan Graves is a 51 y.o. female presenting for 4 day history of throat irritation and postnasal drainage, nasal congestion, cough, reduced sense of taste and shortness of breath on exertion.  Patient reports her husband has similar symptoms.  He took a COVID test was negative but she has not.  She says she has not all the shots so she does not think she can have COVID.  Patient denies any associated fever, body aches, vomiting or diarrhea.  She has not taken any over-the-counter medicine for symptoms.  She has a history of asthma, diabetes, GERD, hypertension, hyperlipidemia, sleep apnea, atrial fibrillation-anticoagulated with Eliquis, chronic pulmonary hypertension, and aortic valve stenosis.  ? ?HPI ? ?Past Medical History:  ?Diagnosis Date  ? Anemia   ? Anxiety   ? Asthma   ? Depression   ? Diabetes mellitus without complication ([place])   ? GERD (gastroesophageal reflux disease)   ? Hypercholesteremia   ? Hypertension   ? Sleep apnea   ? ? ?Patient Active Problem List  ? Diagnosis Date Noted  ? Paroxysmal atrial fibrillation ([place]) 12/09/2020  ? Chronic pulmonary hypertension ([place]) 06/11/2020  ? Moderate aortic valve stenosis 06/11/2020  ? Benign essential hypertension 12/02/2014  ? Hyperlipidemia, mixed 12/11/2013  ? Obstructive sleep apnea 12/11/2013  ? Asthma 12/09/2013  ? Diabetes ([place]) 12/09/2013  ? Gastroesophageal reflux disease 12/09/2013  ? ? ?Past Surgical History:  ?Procedure Laterality Date  ? CARDIOVERSION N/A 12/30/2020  ? Procedure: CARDIOVERSION;  Surgeon: Corey Skains, MD;  Location: [facility] ORS;  Service: Cardiovascular;  Laterality: N/A;  ? TUBAL LIGATION    ? ? ?OB History   ? ? Gravida  ?1  ? Para  ?   ? Term  ?   ? Preterm  ?   ? AB  ?   ? Living  ?   ?  ? ? SAB  ?   ? IAB  ?    ? Ectopic  ?   ? Multiple  ?   ? Live Births  ?   ?   ?  ?  ? ? ? ?Home Medications   ? ?Prior to Admission medications   ?Medication Sig Start Date End Date Taking? Authorizing Provider  ?ipratropium (ATROVENT) 0.06 % nasal spray Place 2 sprays into both nostrils 4 (four) times daily. 06/11/21  Yes Danton Clap, PA-C  ?promethazine-dextromethorphan (PROMETHAZINE-DM) 6.25-15 MG/5ML syrup Take 5 mLs by mouth 4 (four) times daily as needed. 06/11/21  Yes Danton Clap, PA-C  ?ACCU-CHEK SOFTCLIX LANCETS lancets CHECK GLUCOSE TWICE DIALY FOR GLUCOSE MONITORING E11.9 06/09/17   [provider]  ?ADVAIR DISKUS 500-50 MCG/DOSE AEPB 1 DOSE INHALED TWICE DAILY FOR ASTHMA, RINSE MOUTH OUT WITH WATER AFTE USE 04/24/17   [provider]  ?amiodarone (PACERONE) 200 MG tablet Take 200 mg by mouth 2 (two) times daily. 12/09/20   [provider]  ?amLODipine (NORVASC) 10 MG tablet Take 1 tablet by mouth daily. 10/17/18   [provider]  ?apixaban (ELIQUIS) 5 MG TABS tablet Take 1 tablet (5 mg total) by mouth 2 (two) times daily. 12/08/20 01/07/21  Rudene Re, MD  ?atorvastatin (LIPITOR) 20 MG tablet Take 1 tablet by mouth daily. 10/04/19  [provider]  ?buPROPion (WELLBUTRIN SR) 100 MG 12 hr tablet Take 100 mg by mouth 2 (two) times daily. 08/10/20   [provider]  ?clotrimazole (LOTRIMIN) 1 % cream  06/14/17   [provider]  ?enalapril (VASOTEC) 20 MG tablet Take 2 tablets by mouth daily. 02/13/20   [provider]  ?ferrous sulfate 325 (65 FE) MG EC tablet 2 tabletsBY MOUTH  A DAY FOR ANEMIA 11/30/15   [provider]  ?fluticasone (FLONASE) 50 MCG/ACT nasal spray Place 1 spray into both nostrils daily for 7 days. 12/18/20 12/30/20  Lannie Fields, PA-C  ?glipiZIDE (GLUCOTROL XL) 10 MG 24 hr tablet Take only tablet by mouth daily for diabetes 08/14/15   [provider]  ?glucose blood test strip USE AS DIRECTED TWICE A DAY  07/08/15   [provider]  ?Insulin Glargine (BASAGLAR KWIKPEN) 100 UNIT/ML SMARTSIG:65 Unit(s) SUB-Q Every Night 07/16/20   [provider]  ?JARDIANCE 25 MG TABS tablet Take 25 mg by mouth daily. 07/16/20   [provider]  ?metFORMIN (GLUCOPHAGE-XR) 500 MG 24 hr tablet 2 BY MOUTH TWICE DAILY FOR DIABETES 05/26/17   [provider]  ?metoprolol tartrate (LOPRESSOR) 25 MG tablet Take 0.5 tablets (12.5 mg total) by mouth 2 (two) times daily. 12/08/20 12/08/21  Rudene Re, MD  ? ? ?Family History ?Family History  ?Problem Relation Age of Onset  ? Breast cancer Cousin   ? ? ?Social History ?Social History  ? ?Tobacco Use  ? Smoking status: Former  ?  Types: Cigarettes  ? Smokeless tobacco: Never  ?Vaping Use  ? Vaping Use: Every day  ? Substances: Nicotine, Flavoring  ?Substance Use Topics  ? Alcohol use: Yes  ? Drug use: Never  ? ? ? ?Allergies   ?Aspirin ? ? ?Review of Systems ?Review of Systems  ?Constitutional:  Positive for fatigue. Negative for chills, diaphoresis and fever.  ?HENT:  Positive for congestion, rhinorrhea and sore throat. Negative for ear pain, sinus pressure and sinus pain.   ?     Altered taste  ?Respiratory:  Positive for cough and shortness of breath.   ?Cardiovascular:  Negative for chest pain.  ?Gastrointestinal:  Negative for abdominal pain, nausea and vomiting.  ?Musculoskeletal:  Negative for arthralgias and myalgias.  ?Skin:  Negative for rash.  ?Neurological:  Negative for weakness and headaches.  ?Hematological:  Negative for adenopathy.  ? ? ?Physical Exam ?Triage Vital Signs ?ED Triage Vitals  ?Enc Vitals Group  ?   BP 06/11/21 1003 130/64  ?   Pulse Rate 06/11/21 1003 (!) 52  ?   Resp 06/11/21 1003 18  ?   Temp 06/11/21 1003 97.9 ?F (36.6 ?C)  ?   Temp Source 06/11/21 1003 Oral  ?   SpO2 06/11/21 1003 98 %  ?   Weight 06/11/21 0959 (!) 318 lb (144.2 kg)  ?   Height 06/11/21 0959 '5\' 3"'$  (1.6 m)  ?   Head Circumference --   ?   Peak Flow --   ?    Pain Score 06/11/21 0959 5  ?   Pain Loc --   ?   Pain Edu? --   ?   Excl. in [place]? --   ? ?No data found. ? ?Updated Vital Signs ?BP 130/64 (BP Location: Left Arm)   Pulse (!) 52   Temp 97.9 ?F (36.6 ?C) (Oral)   Resp 18   Ht '5\' 3"'$  (1.6 m)   Wt Marland Kitchen)  318 lb (144.2 kg)   LMP 10/06/2017 (Exact Date)   SpO2 98%   BMI 56.33 kg/m?  ?   ? ?Physical Exam ?Vitals and nursing note reviewed.  ?Constitutional:   ?   General: She is not in acute distress. ?   Appearance: Normal appearance. She is obese. She is not ill-appearing or toxic-appearing.  ?HENT:  ?   Head: Normocephalic and atraumatic.  ?   Nose: Congestion present.  ?   Mouth/Throat:  ?   Mouth: Mucous membranes are moist.  ?   Pharynx: Oropharynx is clear. Posterior oropharyngeal erythema (mild with clear PND) present.  ?Eyes:  ?   General: No scleral icterus.    ?   Right eye: No discharge.     ?   Left eye: No discharge.  ?   Conjunctiva/sclera: Conjunctivae normal.  ?Cardiovascular:  ?   Rate and Rhythm: Regular rhythm. Bradycardia present.  ?   Heart sounds: Murmur heard.  ?Pulmonary:  ?   Effort: Pulmonary effort is normal. No respiratory distress.  ?   Breath sounds: Normal breath sounds. No wheezing, rhonchi or rales.  ?Musculoskeletal:  ?   Cervical back: Neck supple.  ?Skin: ?   General: Skin is dry.  ?Neurological:  ?   General: No focal deficit present.  ?   Mental Status: She is alert. Mental status is at baseline.  ?   Motor: No weakness.  ?   Coordination: Coordination normal.  ?   Gait: Gait normal.  ?Psychiatric:     ?   Mood and Affect: Mood normal.     ?   Behavior: Behavior normal.     ?   Thought Content: Thought content normal.  ? ? ? ?UC Treatments / Results  ?Labs ?(all labs ordered are listed, but only abnormal results are displayed) ?Labs Reviewed  ?RESP PANEL BY RT-PCR (FLU A&B, COVID) [id number]  ? ? ?EKG ? ? ?Radiology ?No results found. ? ?Procedures ?Procedures (including critical care time) ? ?Medications Ordered in UC ?Medications -  No data to display ? ?Initial Impression / Assessment and Plan / UC Course  ?I have reviewed the triage vital signs and the nursing notes. ? ?Pertinent labs & imaging results that were available during my care o

## 2021-06-11 NOTE — Discharge Instructions (Addendum)
-  We will call with result of your COVID test.  If you have COVID I can send antiviral medication for you and you will need to isolate 5 days and wear a mask for 5 days. ?- You likely have a viral illness, probably what your husband has.  Based on your exam today, you do not need an antibiotic.  Some nasal spray and cough medicine.  Increase rest and fluids.  Can take Tylenol for any headaches or body aches. ?- Most colds get better within 7 to 10 days. ?- You should return if you have a fever or worsening symptoms including increased breathing difficulty with your oxygen 98% today and your chest is totally clear when I listen to you. ?

## 2021-06-11 NOTE — ED Triage Notes (Signed)
Pt c/o itching in her throat, SOB, altered taste x4days.  ? ?Pt was around her husband who was sick with same symptoms. Marland Kitchen He was covid negative.  ? ?Pt declines a covid test. ?

## 2021-06-18 NOTE — Telephone Encounter (Signed)
Clearance faxed again x 2 

## 2021-06-24 ENCOUNTER — Ambulatory Visit
Admission: RE | Admit: 2021-06-24 | Discharge: 2021-06-24 | Disposition: A | Discharge status: Home / Self Care | Payer: Medicare Other | Source: Ambulatory Visit | Attending: Physician Assistant | Admitting: Physician Assistant

## 2021-06-24 DIAGNOSIS — Z1231 Encounter for screening mammogram for malignant neoplasm of breast: Secondary | ICD-10-CM | POA: Diagnosis present

## 2021-06-24 NOTE — Telephone Encounter (Signed)
Clearance request received via fax and states that pt will need to stop Eliquis 3 days prior to procedure June 26, 2021 and restart 1 day after procedure Jun 30, 2021 ?

## 2021-06-24 NOTE — Telephone Encounter (Signed)
I spoke to pt and she stated that she is aware of instructions as previously stated and expressed understanding ?

## 2021-06-24 NOTE — Telephone Encounter (Signed)
Left detailed msg on VM per HIPAA  

## 2021-06-28 ENCOUNTER — Telehealth: Payer: Self-pay | Admitting: Gastroenterology

## 2021-06-28 NOTE — Telephone Encounter (Signed)
Mon Jun 28, 2021 01:58:21 PM EDT ?[facility] ?[address], [place], [place] [address] ?VI:[id number] ?DOB:Aug 10, 1972Sex:F ?Referring Provider: ?Leslie, DARREN ?[address], [place], [place] [address] ?FMB:[id number] ?RCV:[id number] ?Servicing Facility: ?[facility] ?[address] [place], [place], [place] [address] ?CH:[phone] ?TMB:[id number] ?CXF:[id number] ?Servicing Provider: ?[place], DARREN ?[place], [place], [place] [address] ?IP:[phone] ?YOF:[id number] ?GKK:[id number] ?Category: ?Health Services Review ? ?Service: ?Surgical ? ?Facility: ?[facility] ? ?Certification: ?Initial ? ?Requested Dates: ?Jun 29, 2021 - 2021/10/27 ? ?Diagnosis codes: ?1. ?R13.10 ? ?Dysphagia, unspecified ? ?Requested Services: ?1. ?18343: ? ?DIAGNOSTIC EXAM OF ESOPHAGUS, STOMACH, AND/OR UPPER SMALL BOWEL USING A FLEXIBLE ENDOSCOPE ?Status: CERTIFIED ? ?Servicing Provider:WOHL, DARREN; BDH:[id number], [place] [address] ? ?1 ? ?Status: ?APPROVED ? ?Authorization #: ?[id number] ?

## 2021-06-28 NOTE — Telephone Encounter (Signed)
Patient has new insurance. Procedure for 06/29/2021 needs prior auth with new insurance. Also asking if the procedure can be rescheduled until auth has been approved.  ? ?Spoke with Joseph Art from pre-service center with [facility]. [phone] ext [phone] ?

## 2021-06-29 ENCOUNTER — Ambulatory Visit: Payer: 59 | Admitting: Certified Registered"

## 2021-06-29 ENCOUNTER — Encounter
Admission: RE | Disposition: A | Discharge status: Home / Self Care | Payer: Self-pay | Source: Home / Self Care | Attending: Gastroenterology

## 2021-06-29 ENCOUNTER — Encounter: Payer: Self-pay | Admitting: Gastroenterology

## 2021-06-29 ENCOUNTER — Ambulatory Visit
Admission: RE | Admit: 2021-06-29 | Discharge: 2021-06-29 | Disposition: A | Discharge status: Home / Self Care | Payer: 59 | Attending: Gastroenterology | Admitting: Gastroenterology

## 2021-06-29 ENCOUNTER — Other Ambulatory Visit: Payer: Self-pay

## 2021-06-29 DIAGNOSIS — I10 Essential (primary) hypertension: Secondary | ICD-10-CM | POA: Diagnosis not present

## 2021-06-29 DIAGNOSIS — R1310 Dysphagia, unspecified: Secondary | ICD-10-CM | POA: Diagnosis present

## 2021-06-29 DIAGNOSIS — Z87891 Personal history of nicotine dependence: Secondary | ICD-10-CM | POA: Insufficient documentation

## 2021-06-29 DIAGNOSIS — I4891 Unspecified atrial fibrillation: Secondary | ICD-10-CM | POA: Insufficient documentation

## 2021-06-29 DIAGNOSIS — R1319 Other dysphagia: Secondary | ICD-10-CM

## 2021-06-29 DIAGNOSIS — G4730 Sleep apnea, unspecified: Secondary | ICD-10-CM | POA: Diagnosis not present

## 2021-06-29 DIAGNOSIS — J45909 Unspecified asthma, uncomplicated: Secondary | ICD-10-CM | POA: Diagnosis not present

## 2021-06-29 DIAGNOSIS — E119 Type 2 diabetes mellitus without complications: Secondary | ICD-10-CM | POA: Diagnosis not present

## 2021-06-29 HISTORY — PX: ESOPHAGOGASTRODUODENOSCOPY (EGD) WITH PROPOFOL: SHX5813

## 2021-06-29 LAB — GLUCOSE, CAPILLARY: Glucose-Capillary: 112 mg/dL — ABNORMAL HIGH (ref 70–99)

## 2021-06-29 SURGERY — ESOPHAGOGASTRODUODENOSCOPY (EGD) WITH PROPOFOL
Anesthesia: General

## 2021-06-29 MED ORDER — SODIUM CHLORIDE 0.9 % IV SOLN: INTRAVENOUS | Status: DC

## 2021-06-29 MED ORDER — GLYCOPYRROLATE 0.2 MG/ML IJ SOLN
INTRAMUSCULAR | Status: DC | PRN
  Administered 2021-06-29: .2 mg via INTRAVENOUS

## 2021-06-29 MED ORDER — LIDOCAINE HCL (CARDIAC) PF 100 MG/5ML IV SOSY
PREFILLED_SYRINGE | INTRAVENOUS | Status: DC | PRN
  Administered 2021-06-29: 100 mg via INTRAVENOUS

## 2021-06-29 MED ORDER — DEXMEDETOMIDINE HCL IN NACL 200 MCG/50ML IV SOLN
INTRAVENOUS | Status: DC | PRN
  Administered 2021-06-29: 12 ug via INTRAVENOUS

## 2021-06-29 MED ORDER — PROPOFOL 500 MG/50ML IV EMUL
INTRAVENOUS | Status: AC
  Filled 2021-06-29: qty 50

## 2021-06-29 MED ORDER — PROPOFOL 10 MG/ML IV BOLUS
INTRAVENOUS | Status: DC | PRN
Start: 2021-06-29 — End: 2021-06-29
  Administered 2021-06-29 (×2): 30 mg via INTRAVENOUS
  Administered 2021-06-29: 100 mg via INTRAVENOUS
  Administered 2021-06-29: 50 mg via INTRAVENOUS

## 2021-06-29 NOTE — Transfer of Care (Signed)
Immediate Anesthesia Transfer of Care Note ? ?Patient: Joan Graves ? ?Procedure(s) Performed: ESOPHAGOGASTRODUODENOSCOPY (EGD) WITH PROPOFOL ? ?Patient Location: PACU and Endoscopy Unit ? ?Anesthesia Type:General ? ?Level of Consciousness: awake ? ?Airway & Oxygen Therapy: Patient connected to nasal cannula oxygen ? ?Post-op Assessment: Report given to RN ? ?Post vital signs: stable ? ?Last Vitals:  ?Vitals Value Taken Time  ?BP    ?Temp    ?Pulse    ?Resp    ?SpO2    ? ? ?Last Pain:  ?Vitals:  ? 06/29/21 1027  ?TempSrc: Temporal  ?PainSc: 0-No pain  ?   ? ?  ? ?Complications: No notable events documented. ?

## 2021-06-29 NOTE — H&P (Signed)
? ?Lucilla Lame, MD [facility] ?[address] ?[place], [place] [address] ?[phone] ?Fax : [phone] ? ?Primary Care Physician:  [facility] ?Primary Gastroenterologist:  Dr. Allen Norris ? ?Pre-Procedure History & Physical: ?HPI:  Joan Graves is a 51 y.o. female is here for an endoscopy. ?  ?Past Medical History:  ?Diagnosis Date  ? Anemia   ? Anxiety   ? Asthma   ? Depression   ? Diabetes mellitus without complication ([place])   ? GERD (gastroesophageal reflux disease)   ? Hypercholesteremia   ? Hypertension   ? Sleep apnea   ? ? ?Past Surgical History:  ?Procedure Laterality Date  ? CARDIOVERSION N/A 12/30/2020  ? Procedure: CARDIOVERSION;  Surgeon: Corey Skains, MD;  Location: [facility] ORS;  Service: Cardiovascular;  Laterality: N/A;  ? TUBAL LIGATION    ? ? ?Prior to Admission medications   ?Medication Sig Start Date End Date Taking? Authorizing Provider  ?ADVAIR DISKUS 500-50 MCG/DOSE AEPB 1 DOSE INHALED TWICE DAILY FOR ASTHMA, RINSE MOUTH OUT WITH WATER AFTE USE 04/24/17  Yes [provider]  ?amiodarone (PACERONE) 200 MG tablet Take 200 mg by mouth 2 (two) times daily. 12/09/20  Yes [provider]  ?amLODipine (NORVASC) 10 MG tablet Take 1 tablet by mouth daily. 10/17/18  Yes [provider]  ?apixaban (ELIQUIS) 5 MG TABS tablet Take 5 mg by mouth 2 (two) times daily.   Yes [provider]  ?atorvastatin (LIPITOR) 20 MG tablet Take 1 tablet by mouth daily. 10/04/19  Yes [provider]  ?glipiZIDE (GLUCOTROL XL) 10 MG 24 hr tablet Take only tablet by mouth daily for diabetes 08/14/15  Yes [provider]  ?glucose blood test strip USE AS DIRECTED TWICE A DAY 07/08/15  Yes [provider]  ?Insulin Glargine (BASAGLAR KWIKPEN) 100 UNIT/ML SMARTSIG:65 Unit(s) SUB-Q Every Night 07/16/20  Yes [provider]  ?JARDIANCE 25 MG TABS tablet Take 25 mg by mouth daily. 07/16/20  Yes [provider]   ?metFORMIN (GLUCOPHAGE-XR) 500 MG 24 hr tablet 2 BY MOUTH TWICE DAILY FOR DIABETES 05/26/17  Yes [provider]  ?ACCU-CHEK SOFTCLIX LANCETS lancets CHECK GLUCOSE TWICE DIALY FOR GLUCOSE MONITORING E11.9 06/09/17   [provider]  ?apixaban (ELIQUIS) 5 MG TABS tablet Take 1 tablet (5 mg total) by mouth 2 (two) times daily. 12/08/20 01/07/21  Rudene Re, MD  ?buPROPion [facility] SR) 100 MG 12 hr tablet Take 100 mg by mouth 2 (two) times daily. 08/10/20   [provider]  ?clotrimazole (LOTRIMIN) 1 % cream  06/14/17   [provider]  ?enalapril (VASOTEC) 20 MG tablet Take 2 tablets by mouth daily. 02/13/20   [provider]  ?ferrous sulfate 325 (65 FE) MG EC tablet 2 tabletsBY MOUTH  A DAY FOR ANEMIA 11/30/15   [provider]  ?fluticasone (FLONASE) 50 MCG/ACT nasal spray Place 1 spray into both nostrils daily for 7 days. 12/18/20 12/30/20  Lannie Fields, PA-C  ?ipratropium (ATROVENT) 0.06 % nasal spray Place 2 sprays into both nostrils 4 (four) times daily. 06/11/21   Laurene Footman B, PA-C  ?metoprolol tartrate (LOPRESSOR) 25 MG tablet Take 0.5 tablets (12.5 mg total) by mouth 2 (two) times daily. 12/08/20 12/08/21  Rudene Re, MD  ?promethazine-dextromethorphan (PROMETHAZINE-DM) 6.25-15 MG/5ML syrup Take 5 mLs by mouth 4 (four) times daily as needed. 06/11/21   Danton Clap, PA-C  ? ? ?Allergies as of 05/27/2021 - Review Complete 05/27/2021  ?Allergen Reaction  Noted  ? Aspirin Diarrhea 12/08/2013  ? ? ?Family History  ?Problem Relation Age of Onset  ? Breast cancer Cousin   ? ? ?Social History  ? ?Socioeconomic History  ? Marital status: Married  ?  Spouse name: Not on file  ? Number of children: Not on file  ? Years of education: Not on file  ? Highest education level: Not on file  ?Occupational History  ? Not on file  ?Tobacco Use  ? Smoking status: Former  ?  Types: Cigarettes  ? Smokeless tobacco: Never  ?Vaping Use  ? Vaping Use: Every  day  ? Substances: Nicotine, Flavoring  ?Substance and Sexual Activity  ? Alcohol use: Yes  ? Drug use: Never  ? Sexual activity: Not on file  ?Other Topics Concern  ? Not on file  ?Social History Narrative  ? Not on file  ? ?Social Determinants of Health  ? ?Financial Resource Strain: Not on file  ?Food Insecurity: Not on file  ?Transportation Needs: Not on file  ?Physical Activity: Not on file  ?Stress: Not on file  ?Social Connections: Not on file  ?Intimate Partner Violence: Not on file  ? ? ?Review of Systems: ?See HPI, otherwise negative ROS ? ?Physical Exam: ?LMP 10/06/2017 (Exact Date)  ?General:   Alert,  pleasant and cooperative in NAD ?Head:  Normocephalic and atraumatic. ?Neck:  Supple; no masses or thyromegaly. ?Lungs:  Clear throughout to auscultation.    ?Heart:  Regular rate and rhythm. ?Abdomen:  Soft, nontender and nondistended. Normal bowel sounds, without guarding, and without rebound.   ?Neurologic:  Alert and  oriented x4;  grossly normal neurologically. ? ?Impression/Plan: ?Joan Graves is here for an endoscopy to be performed for dysphagia ? ?Risks, benefits, limitations, and alternatives regarding  endoscopy have been reviewed with the patient.  Questions have been answered.  All parties agreeable. ? ? ?Lucilla Lame, MD  06/29/2021, 10:25 AM ?

## 2021-06-29 NOTE — Op Note (Signed)
[facility] ?Gastroenterology ?Patient Name: Joan Graves ?Procedure Date: 06/29/2021 10:45 AM ?MRN: [id number] ?Account #: [id number] ?Date of Birth: [birth year] ?Admit Type: Outpatient ?Age: 51 ?Room: [facility] ENDO ROOM 4 ?Gender: Female ?Note Status: Finalized ?Instrument Name: Upper Endoscope [id number] ?Procedure:             Upper GI endoscopy ?Indications:           Dysphagia ?Providers:             Lucilla Lame MD, MD ?Referring MD:          Forest Gleason Md, MD (Referring MD) ?Medicines:             Propofol per Anesthesia ?Complications:         No immediate complications. ?Procedure:             Pre-Anesthesia Assessment: ?                       - Prior to the procedure, a History and Physical was  ?                       performed, and patient medications and allergies were  ?                       reviewed. The patient's tolerance of previous  ?                       anesthesia was also reviewed. The risks and benefits  ?                       of the procedure and the sedation options and risks  ?                       were discussed with the patient. All questions were  ?                       answered, and informed consent was obtained. Prior  ?                       Anticoagulants: The patient has taken no previous  ?                       anticoagulant or antiplatelet agents. ASA Grade  ?                       Assessment: II - A patient with mild systemic disease.  ?                       After reviewing the risks and benefits, the patient  ?                       was deemed in satisfactory condition to undergo the  ?                       procedure. ?                       After obtaining informed consent, the endoscope was  ?  passed under direct vision. Throughout the procedure,  ?                       the patient's blood pressure, pulse, and oxygen  ?                       saturations were monitored continuously. The Endoscope  ?                       was  introduced through the mouth, and advanced to the  ?                       second part of duodenum. The upper GI endoscopy was  ?                       accomplished without difficulty. The patient tolerated  ?                       the procedure well. ?Findings: ?     The examined esophagus was normal. A TTS dilator was passed through the  ?     scope. Dilation with a 15-16.5-18 mm balloon dilator was performed to 18  ?     mm. The dilation site was examined following endoscope reinsertion and  ?     showed no change. Two biopsies were obtained in the middle third of the  ?     esophagus with cold forceps for histology. ?     The stomach was normal. ?     The examined duodenum was normal. ?Impression:            - Normal esophagus. Dilated. ?                       - Normal stomach. ?                       - Normal examined duodenum. ?                       - Two biopsies were obtained in the middle third of  ?                       the esophagus. ?Recommendation:        - Discharge patient to home. ?                       - Resume previous diet. ?                       - Continue present medications. ?                       - Await pathology results. ?Procedure Code(s):     --- Professional --- ?                       [phone], Esophagogastroduodenoscopy, flexible,  ?                       transoral; with transendoscopic balloon dilation of  ?  esophagus (less than 30 mm diameter) ?                       [id number], 59, Esophagogastroduodenoscopy, flexible,  ?                       transoral; with biopsy, single or multiple ?Diagnosis Code(s):     --- Professional --- ?                       R13.10, Dysphagia, unspecified ?CPT copyright 2019 American Medical Association. All rights reserved. ?The codes documented in this report are preliminary and upon coder review may  ?be revised to meet current compliance requirements. ?Lucilla Lame MD, MD ?06/29/2021 11:07:50 AM ?This report has been signed  electronically. ?Number of Addenda: 0 ?Note Initiated On: 06/29/2021 10:45 AM ?Estimated Blood Loss:  Estimated blood loss: none. ?     [facility] ?

## 2021-06-29 NOTE — Anesthesia Preprocedure Evaluation (Signed)
Anesthesia Evaluation  ?Patient identified by MRN, date of birth, ID band ?Patient awake ? ? ? ?Reviewed: ?Allergy & Precautions, NPO status , Patient's Chart, lab work & pertinent test results ? ?History of Anesthesia Complications ?Negative for: history of anesthetic complications ? ?Airway ?Mallampati: III ? ?TM Distance: <3 FB ?Neck ROM: full ? ? ? Dental ? ?(+) Chipped, Poor Dentition ?  ?Pulmonary ?shortness of breath and with exertion, asthma , sleep apnea , former smoker,  ?  ?Pulmonary exam normal ? ? ? ? ? ? ? Cardiovascular ?Exercise Tolerance: Good ?hypertension, (-) angina+ dysrhythmias Atrial Fibrillation  ? ? ?  ?Neuro/Psych ?PSYCHIATRIC DISORDERS negative neurological ROS ?   ? GI/Hepatic ?Neg liver ROS, GERD  Controlled,  ?Endo/Other  ?diabetes, Type 2 ? Renal/GU ?negative Renal ROS  ?negative genitourinary ?  ?Musculoskeletal ? ? Abdominal ?  ?Peds ? Hematology ?negative hematology ROS ?(+)   ?Anesthesia Other Findings ?Past Medical History: ?No date: Anemia ?No date: Anxiety ?No date: Asthma ?No date: Depression ?No date: Diabetes mellitus without complication ([place]) ?No date: GERD (gastroesophageal reflux disease) ?No date: Hypercholesteremia ?No date: Hypertension ?No date: Sleep apnea ? ?Past Surgical History: ?12/30/2020: CARDIOVERSION; N/A ?    Comment:  Procedure: CARDIOVERSION;  Surgeon: Corey Skains,  ?             MD;  Location: [facility] ORS;  Service: Cardiovascular;   ?             Laterality: N/A; ?No date: TUBAL LIGATION ? ? ? ? Reproductive/Obstetrics ?negative OB ROS ? ?  ? ? ? ? ? ? ? ? ? ? ? ? ? ?  ?  ? ? ? ? ? ? ? ? ?Anesthesia Physical ?Anesthesia Plan ? ?ASA: 3 ? ?Anesthesia Plan: General  ? ?Post-op Pain Management:   ? ?Induction: Intravenous ? ?PONV Risk Score and Plan: Propofol infusion and TIVA ? ?Airway Management Planned: Natural Airway and Nasal Cannula ? ?Additional Equipment:  ? ?Intra-op Plan:  ? ?Post-operative Plan:  ? ?Informed  Consent: I have reviewed the patients History and Physical, chart, labs and discussed the procedure including the risks, benefits and alternatives for the proposed anesthesia with the patient or authorized representative who has indicated his/her understanding and acceptance.  ? ? ? ?Dental Advisory Given ? ?Plan Discussed with: Anesthesiologist, CRNA and Surgeon ? ?Anesthesia Plan Comments: (Patient consented for risks of anesthesia including but not limited to:  ?- adverse reactions to medications ?- risk of airway placement if required ?- damage to eyes, teeth, lips or other oral mucosa ?- nerve damage due to positioning  ?- sore throat or hoarseness ?- Damage to heart, brain, nerves, lungs, other parts of body or loss of life ? ?Patient voiced understanding.)  ? ? ? ? ? ? ?Anesthesia Quick Evaluation ? ?

## 2021-06-29 NOTE — Anesthesia Postprocedure Evaluation (Signed)
Anesthesia Post Note ? ?Patient: Joan Graves ? ?Procedure(s) Performed: ESOPHAGOGASTRODUODENOSCOPY (EGD) WITH PROPOFOL ? ?Patient location during evaluation: Endoscopy ?Anesthesia Type: General ?Level of consciousness: awake and alert ?Pain management: pain level controlled ?Vital Signs Assessment: post-procedure vital signs reviewed and stable ?Respiratory status: spontaneous breathing, nonlabored ventilation, respiratory function stable and patient connected to nasal cannula oxygen ?Cardiovascular status: blood pressure returned to baseline and stable ?Postop Assessment: no apparent nausea or vomiting ?Anesthetic complications: no ? ? ?No notable events documented. ? ? ?Last Vitals:  ?Vitals:  ? 06/29/21 1130 06/29/21 1140  ?BP: 116/75 113/74  ?Pulse: (!) 50 (!) 50  ?Resp: 15 19  ?Temp:    ?SpO2: 100% 97%  ?  ?Last Pain:  ?Vitals:  ? 06/29/21 1110  ?TempSrc: Temporal  ?PainSc:   ? ? ?  ?  ?  ?  ?  ?  ? ?Precious Haws Curry Seefeldt ? ? ? ? ?

## 2021-06-30 ENCOUNTER — Encounter: Payer: Self-pay | Admitting: Gastroenterology

## 2021-06-30 LAB — SURGICAL PATHOLOGY

## 2021-07-01 ENCOUNTER — Encounter: Payer: Self-pay | Admitting: Gastroenterology

## 2021-07-02 ENCOUNTER — Ambulatory Visit: Payer: Medicare Other | Admitting: Urology

## 2021-08-03 ENCOUNTER — Other Ambulatory Visit: Payer: Self-pay | Admitting: Urology

## 2021-08-05 ENCOUNTER — Encounter: Payer: Self-pay | Admitting: Urology

## 2021-08-05 ENCOUNTER — Ambulatory Visit (INDEPENDENT_AMBULATORY_CARE_PROVIDER_SITE_OTHER): Payer: 59 | Admitting: Urology

## 2021-08-05 VITALS — BP 128/84 | HR 66 | Ht 63.0 in | Wt 319.0 lb

## 2021-08-05 DIAGNOSIS — N3942 Incontinence without sensory awareness: Secondary | ICD-10-CM | POA: Diagnosis not present

## 2021-08-05 MED ORDER — TAMSULOSIN HCL 0.4 MG PO CAPS: 0.4000 mg | ORAL_CAPSULE | Freq: Every day | ORAL | 2 refills | Status: DC

## 2021-08-05 NOTE — Progress Notes (Signed)
08/05/2021 9:15 AM   Joan Graves Joan Graves Jul 25, 1970 [id number]  Referring provider: [facility], [address] [place],  [place] [address]  Chief Complaint  Patient presents with   Other    HPI: Joan Graves is a 51 y.o. female referred for evaluation of urinary incontinence.  Long history unaware incontinence Refer to my previous note 05/03/2021 No improvement with Myrbetriq trial As directed study performed 07/27/2021. No significant residual on catheter placement Bladder capacity 440 mL with for sensation 161, normal desire 216 and strong desire at 230 mL No leakage noted during the procedure Generated a voluntary contraction.  Had to strain and bear down in order to empty bladder; max detrusor pressure 37 cmH2O Residual after pressure flow 196 mL Intermittent increase in EMG activity noted during voiding  PMH: Past Medical History:  Diagnosis Date   Anemia    Anxiety    Asthma    Depression    Diabetes mellitus without complication (HCC)    GERD (gastroesophageal reflux disease)    Hypercholesteremia    Hypertension    Sleep apnea     Surgical History: Past Surgical History:  Procedure Laterality Date   CARDIOVERSION N/A 12/30/2020   Procedure: CARDIOVERSION;  Surgeon: Corey Skains, MD;  Location: [facility] ORS;  Service: Cardiovascular;  Laterality: N/A;   COLONOSCOPY     ESOPHAGOGASTRODUODENOSCOPY (EGD) WITH PROPOFOL N/A 06/29/2021   Procedure: ESOPHAGOGASTRODUODENOSCOPY (EGD) WITH PROPOFOL;  Surgeon: Lucilla Lame, MD;  Location: [facility] ENDOSCOPY;  Service: Endoscopy;  Laterality: N/A;   TUBAL LIGATION      Home Medications:  Allergies as of 08/05/2021       Reactions   Aspirin Diarrhea   "high doses" gives her diarrhea        Medication List        Accurate as of August 05, 2021  9:15 AM. If you have any questions, ask your nurse or doctor.          Accu-Chek Softclix Lancets lancets CHECK GLUCOSE  TWICE DIALY FOR GLUCOSE MONITORING E11.9   Advair Diskus 500-50 MCG/ACT Aepb Generic drug: fluticasone-salmeterol 1 DOSE INHALED TWICE DAILY FOR ASTHMA, RINSE MOUTH OUT WITH WATER AFTE USE   amiodarone 200 MG tablet Commonly known as: PACERONE Take 200 mg by mouth 2 (two) times daily.   amLODipine 10 MG tablet Commonly known as: NORVASC Take 1 tablet by mouth daily.   Eliquis 5 MG Tabs tablet Generic drug: apixaban Take 5 mg by mouth 2 (two) times daily.   apixaban 5 MG Tabs tablet Commonly known as: ELIQUIS Take 1 tablet (5 mg total) by mouth 2 (two) times daily.   atorvastatin 20 MG tablet Commonly known as: LIPITOR Take 1 tablet by mouth daily.   Basaglar KwikPen 100 UNIT/ML SMARTSIG:65 Unit(s) SUB-Q Every Night   buPROPion ER 100 MG 12 hr tablet Commonly known as: WELLBUTRIN SR Take 100 mg by mouth 2 (two) times daily.   clotrimazole 1 % cream Commonly known as: LOTRIMIN   enalapril 20 MG tablet Commonly known as: VASOTEC Take 2 tablets by mouth daily.   ferrous sulfate 325 (65 FE) MG EC tablet 2 tabletsBY MOUTH  A DAY FOR ANEMIA   fluticasone 50 MCG/ACT nasal spray Commonly known as: FLONASE Place 1 spray into both nostrils daily for 7 days.   glipiZIDE 10 MG 24 hr tablet Commonly known as: GLUCOTROL XL Take only tablet by mouth daily for diabetes   glucose blood test  strip USE AS DIRECTED TWICE A DAY   ipratropium 0.06 % nasal spray Commonly known as: ATROVENT Place 2 sprays into both nostrils 4 (four) times daily.   Jardiance 25 MG Tabs tablet Generic drug: empagliflozin Take 25 mg by mouth daily.   metFORMIN 500 MG 24 hr tablet Commonly known as: GLUCOPHAGE-XR 2 BY MOUTH TWICE DAILY FOR DIABETES   metoprolol tartrate 25 MG tablet Commonly known as: LOPRESSOR Take 0.5 tablets (12.5 mg total) by mouth 2 (two) times daily.   promethazine-dextromethorphan 6.25-15 MG/5ML syrup Commonly known as: PROMETHAZINE-DM Take 5 mLs by mouth 4 (four)  times daily as needed.        Allergies:  Allergies  Allergen Reactions   Aspirin Diarrhea    "high doses" gives her diarrhea    Family History: Family History  Problem Relation Age of Onset   Breast cancer Cousin     Social History:  reports that she has quit smoking. Her smoking use included cigarettes. She has never used smokeless tobacco. She reports current alcohol use. She reports that she does not use drugs.   Physical Exam: BP 128/84   Pulse 66   Ht '5\' 3"'$  (1.6 m)   Wt (!) 319 lb (144.7 kg)   LMP 10/06/2017 (Exact Date)   BMI 56.51 kg/m   Constitutional:  Alert and oriented, No acute distress. HEENT: [place] AT, moist mucus membranes.  Trachea midline, no masses. Cardiovascular: No clubbing, cyanosis, or edema. Respiratory: Normal respiratory effort, no increased work of breathing. Psychiatric: Normal mood and affect.   Assessment & Plan:    1.  Unaware incontinence Discussed with patient no findings on urodynamic study to explain her unaware incontinence She was noted to strain with voiding with intermittent increased EMG activity during voiding Alpha-blocker trial-tamsulosin 0.4 mg daily Will asked Dr. Matilde Sprang to see for second opinion on unaware incontinence    Abbie Sons, MD  [place] [address], [place], [place] [address] [phone]

## 2021-09-20 ENCOUNTER — Encounter: Payer: Self-pay | Admitting: Urology

## 2021-09-20 ENCOUNTER — Ambulatory Visit (INDEPENDENT_AMBULATORY_CARE_PROVIDER_SITE_OTHER): Payer: 59 | Admitting: Urology

## 2021-09-20 VITALS — BP 102/67 | HR 66 | Ht 63.0 in | Wt 320.0 lb

## 2021-09-20 DIAGNOSIS — N3946 Mixed incontinence: Secondary | ICD-10-CM

## 2021-09-20 DIAGNOSIS — N3942 Incontinence without sensory awareness: Secondary | ICD-10-CM | POA: Diagnosis not present

## 2021-09-20 LAB — URINALYSIS, COMPLETE
Bilirubin, UA: NEGATIVE
Ketones, UA: NEGATIVE
Nitrite, UA: NEGATIVE
Protein,UA: NEGATIVE
RBC, UA: NEGATIVE
Specific Gravity, UA: <1.005 — ABNORMAL LOW (ref 1.005–1.030)
Urobilinogen, Ur: 0.2 mg/dL (ref 0.2–1.0)
pH, UA: 5 (ref 5.0–7.5)

## 2021-09-20 LAB — MICROSCOPIC EXAMINATION

## 2021-09-20 MED ORDER — GEMTESA 75 MG PO TABS: 1.0000 | ORAL_TABLET | Freq: Every day | ORAL | 11 refills | Status: DC

## 2021-09-20 NOTE — Progress Notes (Signed)
09/20/2021 1:11 PM   Joan Graves December 11, 1970 [id number]  Referring provider: [facility], [address] [place],  [place] [address]  Chief Complaint  Patient presents with   Follow-up   Urinary Incontinence    HPI: ST: Leaks without awareness.  Has had urodynamic and failed Myrbetriq.  Had a residual of 196 mL after the study but a normal residual prior.  Was given Flomax.  Failed oxybutynin  Today Patient has small-volume urge incontinence.  She says she leaks when she is in a chair.  I could not tell if it was leakage well awareness in a chair or it occurs when she sits.  History was challenging.  No leaking with coughing sneezing.  Has moderately severe bedwetting.  Wears more than 5 pads a day moderately wet  She voids every 60 to 90 minutes.  She gets up 5 times a night.  She has ankle edema.  Flow is reasonable  She is an insulin-dependent diabetic.  No hysterectomy  No history of kidney stones bladder surgery and gets a rare bladder infection.  On urodynamics she did not void and was catheterized for a few milliliters.  Maximum bladder capacity was 440 mL.  Bladder was stable.  She had no stress incontinence generating a Valsalva pressure 103 cm water.  During voluntary voiding she voided 244 mL with maximal flow 12 mils per second.  Max voiding pressure 37 cm of water.  Residual was 196 mL.  Bladder neck descended less than 1 cm.  She did a lot of straining to void and it was prolonged interrupted pattern.     PMH: Past Medical History:  Diagnosis Date   Anemia    Anxiety    Asthma    Depression    Diabetes mellitus without complication ([place])    GERD (gastroesophageal reflux disease)    Hypercholesteremia    Hypertension    Sleep apnea     Surgical History: Past Surgical History:  Procedure Laterality Date   CARDIOVERSION N/A 12/30/2020   Procedure: CARDIOVERSION;  Surgeon: Corey Skains, MD;  Location:  [facility] ORS;  Service: Cardiovascular;  Laterality: N/A;   COLONOSCOPY     ESOPHAGOGASTRODUODENOSCOPY (EGD) WITH PROPOFOL N/A 06/29/2021   Procedure: ESOPHAGOGASTRODUODENOSCOPY (EGD) WITH PROPOFOL;  Surgeon: Lucilla Lame, MD;  Location: [facility] ENDOSCOPY;  Service: Endoscopy;  Laterality: N/A;   TUBAL LIGATION      Home Medications:  Allergies as of 09/20/2021       Reactions   Aspirin Diarrhea   "high doses" gives her diarrhea        Medication List        Accurate as of September 20, 2021  1:11 PM. If you have any questions, ask your nurse or doctor.          Accu-Chek Softclix Lancets lancets CHECK GLUCOSE TWICE DIALY FOR GLUCOSE MONITORING E11.9   Advair Diskus 500-50 MCG/ACT Aepb Generic drug: fluticasone-salmeterol 1 DOSE INHALED TWICE DAILY FOR ASTHMA, RINSE MOUTH OUT WITH WATER AFTE USE   amiodarone 200 MG tablet Commonly known as: PACERONE Take 200 mg by mouth 2 (two) times daily.   amLODipine 10 MG tablet Commonly known as: NORVASC Take 1 tablet by mouth daily.   atorvastatin 20 MG tablet Commonly known as: LIPITOR Take 1 tablet by mouth daily.   Basaglar KwikPen 100 UNIT/ML SMARTSIG:65 Unit(s) SUB-Q Every Night   buPROPion ER 100 MG 12 hr tablet Commonly known as: WELLBUTRIN SR Take 100  mg by mouth 2 (two) times daily.   clotrimazole 1 % cream Commonly known as: LOTRIMIN   Eliquis 5 MG Tabs tablet Generic drug: apixaban Take 5 mg by mouth 2 (two) times daily.   enalapril 20 MG tablet Commonly known as: VASOTEC Take 2 tablets by mouth daily.   ferrous sulfate 325 (65 FE) MG EC tablet 2 tabletsBY MOUTH  A DAY FOR ANEMIA   fluticasone 50 MCG/ACT nasal spray Commonly known as: FLONASE Place 1 spray into both nostrils daily for 7 days.   glipiZIDE 10 MG 24 hr tablet Commonly known as: GLUCOTROL XL Take only tablet by mouth daily for diabetes   glucose blood test strip USE AS DIRECTED TWICE A DAY   ipratropium 0.06 % nasal spray Commonly known as:  ATROVENT Place 2 sprays into both nostrils 4 (four) times daily.   Jardiance 25 MG Tabs tablet Generic drug: empagliflozin Take 25 mg by mouth daily.   metFORMIN 500 MG 24 hr tablet Commonly known as: GLUCOPHAGE-XR 2 BY MOUTH TWICE DAILY FOR DIABETES   metoprolol tartrate 25 MG tablet Commonly known as: LOPRESSOR Take 0.5 tablets (12.5 mg total) by mouth 2 (two) times daily.   promethazine-dextromethorphan 6.25-15 MG/5ML syrup Commonly known as: PROMETHAZINE-DM Take 5 mLs by mouth 4 (four) times daily as needed.   tamsulosin 0.4 MG Caps capsule Commonly known as: FLOMAX Take 1 capsule (0.4 mg total) by mouth daily.        Allergies:  Allergies  Allergen Reactions   Aspirin Diarrhea    "high doses" gives her diarrhea    Family History: Family History  Problem Relation Age of Onset   Breast cancer Cousin     Social History:  reports that she has quit smoking. Her smoking use included cigarettes. She has been exposed to tobacco smoke. She has never used smokeless tobacco. She reports current alcohol use. She reports that she does not use drugs.  ROS:                                        Physical Exam: BP 102/67   Pulse 66   Ht '5\' 3"'$  (1.6 m)   Wt (!) 145.2 kg   LMP 10/06/2017 (Exact Date)   BMI 56.69 kg/m   Constitutional:  Alert and oriented, No acute distress.   Laboratory Data: Lab Results  Component Value Date   WBC 5.3 12/07/2020   HGB 14.4 12/07/2020   HCT 42.8 12/07/2020   MCV 87.3 12/07/2020   PLT 242 12/07/2020    Lab Results  Component Value Date   CREATININE 1.00 12/07/2020    No results found for: "PSA"  No results found for: "TESTOSTERONE"  No results found for: "HGBA1C"  Urinalysis    Component Value Date/Time   COLORURINE RED (A) 11/28/2020 1830   APPEARANCEUR Hazy (A) 05/03/2021 0908   LABSPEC 1.032 (H) 11/28/2020 1830   PHURINE  11/28/2020 1830    TEST NOT REPORTED DUE TO COLOR INTERFERENCE OF  URINE PIGMENT   GLUCOSEU Trace (A) 05/03/2021 0908   HGBUR (A) 11/28/2020 1830    TEST NOT REPORTED DUE TO COLOR INTERFERENCE OF URINE PIGMENT   BILIRUBINUR Negative 05/03/2021 0908   KETONESUR (A) 11/28/2020 1830    TEST NOT REPORTED DUE TO COLOR INTERFERENCE OF URINE PIGMENT   PROTEINUR Negative 05/03/2021 0908   PROTEINUR (A) 11/28/2020 1830    TEST  NOT REPORTED DUE TO COLOR INTERFERENCE OF URINE PIGMENT   NITRITE Negative 05/03/2021 0908   NITRITE (A) 11/28/2020 1830    TEST NOT REPORTED DUE TO COLOR INTERFERENCE OF URINE PIGMENT   LEUKOCYTESUR Negative 05/03/2021 0908   LEUKOCYTESUR (A) 11/28/2020 1830    TEST NOT REPORTED DUE TO COLOR INTERFERENCE OF URINE PIGMENT    Pertinent Imaging: Urine reviewed.  Urine sent for culture.  Chart reviewed  Assessment & Plan: Based upon the presentation appears that 90% of the patient's problem or greater is refractory overactive bladder.  If she does have stress incontinence it would be mild and does not explain leaking when she sits in a chair.  Treatments for stress incontinence would be challenging with body habitus.  Call if culture positive.  Reassess for pelvic examination cystoscopy on [place] in 5 to 6 weeks.  Bedwetting due to overactive bladder.  No recent urine culture.  Body habitus may also make some of the refractory OAB therapies challenging  1. Urinary incontinence without sensory awareness   - Urinalysis, Complete   No follow-ups on file.  Reece Packer, MD  [facility] [address], [place], [place] [address] [phone]

## 2021-09-20 NOTE — Patient Instructions (Signed)

## 2021-09-23 LAB — CULTURE, URINE COMPREHENSIVE

## 2021-09-24 ENCOUNTER — Telehealth: Payer: Self-pay | Admitting: *Deleted

## 2021-09-24 MED ORDER — CEPHALEXIN 500 MG PO CAPS: 500.0000 mg | ORAL_CAPSULE | Freq: Three times a day (TID) | ORAL | 0 refills | Status: DC

## 2021-09-24 NOTE — Telephone Encounter (Addendum)
Patient informed, sent in RX. Voiced understanding.    ----- Message from Bjorn Loser, MD sent at 09/23/2021  6:09 PM EDT ----- Keflex 500 mg tid for 7 days ----- Message ----- From: Shanon Ace, CMA Sent: 09/23/2021   9:05 AM EDT To: Bjorn Loser, MD   ----- Message ----- From: Interface, [facility] Lab Results In Sent: 09/20/2021   4:41 PM EDT To: Rowe Robert Clinical

## 2021-10-11 ENCOUNTER — Ambulatory Visit: Payer: 59 | Admitting: Urology

## 2021-11-09 ENCOUNTER — Ambulatory Visit
Admission: EM | Admit: 2021-11-09 | Discharge: 2021-11-09 | Disposition: A | Discharge status: Home / Self Care | Payer: 59 | Attending: Family Medicine | Admitting: Family Medicine

## 2021-11-09 ENCOUNTER — Ambulatory Visit (INDEPENDENT_AMBULATORY_CARE_PROVIDER_SITE_OTHER): Payer: 59

## 2021-11-09 DIAGNOSIS — R1010 Upper abdominal pain, unspecified: Secondary | ICD-10-CM

## 2021-11-09 LAB — CBC WITH DIFFERENTIAL/PLATELET
Abs Immature Granulocytes: 0.02 10*3/uL (ref 0.00–0.07)
Basophils Absolute: 0 10*3/uL (ref 0.0–0.1)
Basophils Relative: 0 %
Eosinophils Absolute: 0.1 10*3/uL (ref 0.0–0.5)
Eosinophils Relative: 1 %
HCT: 42.8 % (ref 36.0–46.0)
Hemoglobin: 15.9 g/dL — ABNORMAL HIGH (ref 12.0–15.0)
Immature Granulocytes: 0 %
Lymphocytes Relative: 24 %
Lymphs Abs: 1.9 10*3/uL (ref 0.7–4.0)
MCH: 33.3 pg (ref 26.0–34.0)
MCHC: 37.1 g/dL — ABNORMAL HIGH (ref 30.0–36.0)
MCV: 89.7 fL (ref 80.0–100.0)
Monocytes Absolute: 0.4 10*3/uL (ref 0.1–1.0)
Monocytes Relative: 5 %
Neutro Abs: 5.5 10*3/uL (ref 1.7–7.7)
Neutrophils Relative %: 70 %
Platelets: 275 10*3/uL (ref 150–400)
RBC: 4.77 MIL/uL (ref 3.87–5.11)
RDW: 16.6 % — ABNORMAL HIGH (ref 11.5–15.5)
WBC: 7.9 10*3/uL (ref 4.0–10.5)
nRBC: 0 % (ref 0.0–0.2)

## 2021-11-09 LAB — COMPREHENSIVE METABOLIC PANEL
ALT: 23 U/L (ref 0–44)
AST: 23 U/L (ref 15–41)
Albumin: 4 g/dL (ref 3.5–5.0)
Alkaline Phosphatase: 93 U/L (ref 38–126)
Anion gap: 8 (ref 5–15)
BUN: 12 mg/dL (ref 6–20)
CO2: 29 mmol/L (ref 22–32)
Calcium: 10 mg/dL (ref 8.9–10.3)
Chloride: 102 mmol/L (ref 98–111)
Creatinine, Ser: 0.78 mg/dL (ref 0.44–1.00)
GFR, Estimated: >60 mL/min (ref >60)
Glucose, Bld: 176 mg/dL — ABNORMAL HIGH (ref 70–99)
Potassium: 4.6 mmol/L (ref 3.5–5.1)
Sodium: 139 mmol/L (ref 135–145)
Total Bilirubin: 0.8 mg/dL (ref 0.3–1.2)
Total Protein: 8.5 g/dL — ABNORMAL HIGH (ref 6.5–8.1)

## 2021-11-09 LAB — LIPASE, BLOOD: Lipase: 41 U/L (ref 11–51)

## 2021-11-09 LAB — PREGNANCY, URINE: Preg Test, Ur: NEGATIVE

## 2021-11-09 MED ORDER — TRAMADOL HCL 50 MG PO TABS: 50.0000 mg | ORAL_TABLET | Freq: Four times a day (QID) | ORAL | 0 refills | Status: DC | PRN

## 2021-11-09 MED ORDER — ONDANSETRON HCL 4 MG PO TABS: 4.0000 mg | ORAL_TABLET | Freq: Four times a day (QID) | ORAL | 0 refills | Status: DC

## 2021-11-09 MED ORDER — SENNOSIDES-DOCUSATE SODIUM 8.6-50 MG PO TABS: 2.0000 | ORAL_TABLET | Freq: Two times a day (BID) | ORAL | 0 refills | Status: DC

## 2021-11-09 NOTE — Discharge Instructions (Addendum)
Stop by the pharmacy to pick up your prescriptions.  Follow up with your primary care provider as needed.  Based on concerns about condition, if you do not follow up in the ED, you may risk poor outcomes including worsening of condition, delayed treatment and potentially life threatening issues. If you have declined to go to the ED at this time, you should call your PCP immediately to set up a follow up appointment.   Go to ED for red flag symptoms, including; fevers you cannot reduce with Tylenol/Motrin, severe headaches, vision changes, numbness/weakness in part of the body, lethargy, confusion, intractable vomiting, severe dehydration, chest pain, breathing difficulty, severe persistent abdominal or pelvic pain, signs of severe infection (increased redness, swelling of an area), feeling faint or passing out, dizziness, etc. You should especially go to the ED for sudden acute worsening of condition if you do not elect to go at this time.

## 2021-11-09 NOTE — ED Provider Notes (Signed)
[facility]    CSN: [id number] Arrival date & time: 11/09/21  1431      History   Chief Complaint No chief complaint on file.   HPI Joan Graves School is a 51 y.o. female.   HPI   Joan Graves presents for constant abdominal pain for the past month.  She is unable to describe what the pain feels like. "It's just hurting."   Reports nausea but no vomiting. Pain located "from ribcage to ribcage".  Nothing makes pain better or worse. Rated 8/10. Does not get to a 10. Never had anything like this before. No change in pain with movement or eating. No constipation, diarrhea, blood in stool or urine. She can get what she likes even though she is nauseous. No dysuria, urinary urgency or frequency. No history of kidney stones or gallstones. She is passing gas from above and below but no more than normal. No treatments prior to arrival. Patient's last menstrual period was 10/06/2017 (approximate). She didn't have a period from May 2019 then last month she started having a period.   Past Surgeries: no abdominal surgeries   Symptoms Nausea/Vomiting: yes  Diarrhea: no  Constipation: no Melena/BRBPR: no  Hematemesis: no  Anorexia: no  Fever/Chills: no  Dysuria: no  Rash: no  Wt loss: yes  NSAIDs/ASA: no  LMP: Patient's last menstrual period was 10/06/2017 (approximate). Normal period Vaginal bleeding: no  STD risk/hx: no  Sore throat: no   Cough: no Nasal congestion : no  Sleep disturbance: no Back Pain: no Headache: no   Past Medical History:  Diagnosis Date   Anemia    Anxiety    Asthma    Depression    Diabetes mellitus without complication (HCC)    GERD (gastroesophageal reflux disease)    Hypercholesteremia    Hypertension    Sleep apnea     Patient Active Problem List   Diagnosis Date Noted   Esophageal dysphagia    Problems with swallowing and mastication    Paroxysmal atrial fibrillation (Joan Graves) 12/09/2020   Chronic pulmonary hypertension (Joan Graves)  06/11/2020   Moderate aortic valve stenosis 06/11/2020   Benign essential hypertension 12/02/2014   Hyperlipidemia, mixed 12/11/2013   Obstructive sleep apnea 12/11/2013   Asthma 12/09/2013   Diabetes (Joan Graves) 12/09/2013   Gastroesophageal reflux disease 12/09/2013    Past Surgical History:  Procedure Laterality Date   CARDIOVERSION N/A 12/30/2020   Procedure: CARDIOVERSION;  Surgeon: Corey Skains, MD;  Location: [facility];  Service: Cardiovascular;  Laterality: N/A;   COLONOSCOPY     ESOPHAGOGASTRODUODENOSCOPY (EGD) WITH PROPOFOL N/A 06/29/2021   Procedure: ESOPHAGOGASTRODUODENOSCOPY (EGD) WITH PROPOFOL;  Surgeon: Lucilla Lame, MD;  Location: [facility];  Service: Endoscopy;  Laterality: N/A;   TUBAL LIGATION      OB History     Gravida  1   Para      Term      Preterm      AB      Living         SAB      IAB      Ectopic      Multiple      Live Births               Home Medications    Prior to Admission medications   Medication Sig Start Date End Date Taking? Authorizing Provider  ACCU-CHEK SOFTCLIX LANCETS lancets CHECK GLUCOSE TWICE DIALY FOR GLUCOSE MONITORING E11.9 06/09/17  Yes [provider]  ADVAIR DISKUS 500-50 MCG/DOSE AEPB 1 DOSE INHALED TWICE DAILY FOR ASTHMA, RINSE MOUTH OUT WITH WATER AFTE USE 04/24/17  Yes [provider]  amiodarone (PACERONE) 200 MG tablet Take 200 mg by mouth 2 (two) times daily. 12/09/20  Yes [provider]  amLODipine (NORVASC) 10 MG tablet Take 1 tablet by mouth daily. 10/17/18  Yes [provider]  apixaban (ELIQUIS) 5 MG TABS tablet Take 5 mg by mouth 2 (two) times daily.   Yes [provider]  atorvastatin (LIPITOR) 20 MG tablet Take 1 tablet by mouth daily. 10/04/19  Yes [provider]  buPROPion (WELLBUTRIN SR) 100 MG 12 hr tablet Take 100 mg by mouth 2 (two) times daily. 08/10/20  Yes [provider]  enalapril (VASOTEC) 20 MG tablet Take 2 tablets  by mouth daily. 02/13/20  Yes [provider]  ferrous sulfate 325 (65 FE) MG EC tablet 2 tabletsBY MOUTH  A DAY FOR ANEMIA 11/30/15  Yes [provider]  glipiZIDE (GLUCOTROL XL) 10 MG 24 hr tablet Take only tablet by mouth daily for diabetes 08/14/15  Yes [provider]  glucose blood test strip USE AS DIRECTED TWICE A DAY 07/08/15  Yes [provider]  Insulin Glargine (BASAGLAR KWIKPEN) 100 UNIT/ML SMARTSIG:65 Unit(s) SUB-Q Every Night 07/16/20  Yes [provider]  JARDIANCE 25 MG TABS tablet Take 25 mg by mouth daily. 07/16/20  Yes [provider]  metFORMIN (GLUCOPHAGE-XR) 500 MG 24 hr tablet 2 BY MOUTH TWICE DAILY FOR DIABETES 05/26/17  Yes [provider]  metoprolol tartrate (LOPRESSOR) 25 MG tablet Take 0.5 tablets (12.5 mg total) by mouth 2 (two) times daily. 12/08/20 12/08/21 Yes Veronese, [place], MD  ondansetron (ZOFRAN) 4 MG tablet Take 1 tablet (4 mg total) by mouth every 6 (six) hours. 11/09/21  Yes Kwali Wrinkle, DO  senna-docusate (SENOKOT-S) 8.6-50 MG tablet Take 2 tablets by mouth 2 (two) times daily. 11/09/21  Yes Joan Illingworth, DO  traMADol (ULTRAM) 50 MG tablet Take 1 tablet (50 mg total) by mouth every 6 (six) hours as needed. 11/09/21  Yes Santiaga Butzin, Joan Juniper, DO  clotrimazole (LOTRIMIN) 1 % cream  06/14/17   [provider]  fluticasone (FLONASE) 50 MCG/ACT nasal spray Place 1 spray into both nostrils daily for 7 days. 12/18/20   Joan Fields, PA-C  ipratropium (ATROVENT) 0.06 % nasal spray Place 2 sprays into both nostrils 4 (four) times daily. 06/11/21   Joan Clap, PA-C  promethazine-dextromethorphan (PROMETHAZINE-DM) 6.25-15 MG/5ML syrup Take 5 mLs by mouth 4 (four) times daily as needed. 06/11/21   Joan Clap, PA-C  tamsulosin (FLOMAX) 0.4 MG CAPS capsule Take 1 capsule (0.4 mg total) by mouth daily. 08/05/21   Stoioff, Joan Fairly, MD  Vibegron (GEMTESA) 75 MG TABS Take 1 tablet by mouth daily.  09/20/21   Joan Loser, MD    Family History Family History  Problem Relation Age of Onset   Breast cancer Cousin     Social History Social History   Tobacco Use   Smoking status: Former    Types: Cigarettes    Passive exposure: Past   Smokeless tobacco: Never  Vaping Use   Vaping Use: Every day   Substances: Nicotine, Flavoring  Substance Use Topics   Alcohol use: Not Currently    Comment: none last 24hrs   Drug use: Never     Allergies   Aspirin   Review of Systems Review of Systems : :negative unless otherwise stated in HPI.  Physical Exam Triage Vital Signs ED Triage Vitals  Enc Vitals Group     BP 11/09/21 1557 109/84     Pulse Rate 11/09/21 1555 81     Resp 11/09/21 1555 20     Temp 11/09/21 1555 97.7 F (36.5 C)     Temp Source 11/09/21 1555 Oral     SpO2 11/09/21 1555 96 %     Weight --      Height --      Head Circumference --      Peak Flow --      Pain Score 11/09/21 1547 8     Pain Loc --      Pain Edu? --      Excl. in [place]? --    No data found.  Updated Vital Signs BP 109/84   Pulse 81   Temp 97.7 F (36.5 C) (Oral)   Resp 20   LMP 10/06/2017 (Approximate) Comment: Pt did not have period for approx 4 years then started periods again this spring, irregular. neg preg test  SpO2 96%   Visual Acuity Right Eye Distance:   Left Eye Distance:   Bilateral Distance:    Right Eye Near:   Left Eye Near:    Bilateral Near:     Physical Exam  GEN: pleasant well appearing female, in no acute distress CV: regular rate and rhythm RESP: no increased work of breathing, clear to ascultation bilaterally ABD: Bowel sounds present. Soft, obese abdomen, non-tender in all quadrants. No guarding, no rebound, no appreciable hepatosplenomegaly, no CVA tenderness, unable to assess McBurney's and Murphy's signs due to body habitus MSK: no extremity edema SKIN: warm, dry, no rash on visible skin NEURO: alert, moves all extremities  appropriately PSYCH: Normal affect, appropriate speech and behavior   UC Treatments / Results  Labs (all labs ordered are listed, but only abnormal results are displayed) Labs Reviewed  COMPREHENSIVE METABOLIC PANEL - Abnormal; Notable for the following components:      Result Value   Glucose, Bld 176 (*)    Total Protein 8.5 (*)    All other components within normal limits  CBC WITH DIFFERENTIAL/PLATELET - Abnormal; Notable for the following components:   Hemoglobin 15.9 (*)    MCHC 37.1 (*)    RDW 16.6 (*)    All other components within normal limits  PREGNANCY, URINE  LIPASE, BLOOD    EKG   Radiology DG Abdomen 1 View  Result Date: 11/09/2021 CLINICAL DATA:  Upper abdominal pain EXAM: ABDOMEN - 1 VIEW COMPARISON:  None Available. FINDINGS: The bowel gas pattern is normal. No radio-opaque calculi or other significant radiographic abnormality are seen. IMPRESSION: Negative. Electronically Signed   By: Rolm Baptise M.D.   On: 11/09/2021 18:05    Procedures Procedures (including critical care time)  Medications Ordered in UC Medications - No data to display  Initial Impression / Assessment and Plan / UC Course  I have reviewed the triage vital signs and the nursing notes.  Pertinent labs & imaging results that were available during my care of the patient were reviewed by me and considered in my medical decision making (see chart for details).      Patient is a  51 y.o. femalewith history hypertension, pulmonary hypertension, aortic valve stenosis, paroxysmal A-fib, sleep apnea, asthma, GERD, diabetes, hyperlipidemia who presents after having insidious moderate abdominal pain about a month ago.  Overall, patient is well-appearing, well-hydrated, and in no acute distress.  Vital signs stable.  Wendyis afebrile.  Exam is not concerning for an acute abdomen.  Obtained UA, urine pregnancy, CBC, CMP, and lipase.    Patient states her and her partner are trying to get pregnant.   She is interested in tubal reversal.  Urine pregnancy test negative.  CBC unremarkable for leukocytosis or anemia.  She does have a mild polycythemia.  There is no transaminitis and lipase is normal.  She is having upper abdominal pain therefore I doubt ovarian torsion or ovarian cyst.  There is no fever, leukocytosis to suggest acute appendicitis.  She is not having diarrhea to suggest infectious cause.  Not likely gastroenteritis.  KUB obtained did not show significant constipation.  She does have history of constipation.  No blood seen on urinalysis and she has no personal history of kidney stones.  UA not concerning for acute cystitis.  Etiology of patient's upper abdominal pain is unclear.  She does note that she was picking up the trash bags at work that were heavy recently.  This may be muscular strain.  She takes Eliquis for paroxysmal A-fib and will need to avoid NSAIDs.  Prescribed tramadol for pain and sennna for bowel prophylaxis.  Return and ED precautions given.  Patient voiced understanding.  Discussed MDM, treatment plan and plan for follow-up with patient/parent who agrees with plan.    Final Clinical Impressions(s) / UC Diagnoses   Final diagnoses:  Upper abdominal pain     Discharge Instructions      Stop by the pharmacy to pick up your prescriptions.  Follow up with your primary care provider as needed.  Based on concerns about condition, if you do not follow up in the ED, you may risk poor outcomes including worsening of condition, delayed treatment and potentially life threatening issues. If you have declined to go to the ED at this time, you should call your PCP immediately to set up a follow up appointment.   Go to ED for red flag symptoms, including; fevers you cannot reduce with Tylenol/Motrin, severe headaches, vision changes, numbness/weakness in part of the body, lethargy, confusion, intractable vomiting, severe dehydration, chest pain, breathing difficulty, severe  persistent abdominal or pelvic pain, signs of severe infection (increased redness, swelling of an area), feeling faint or passing out, dizziness, etc. You should especially go to the ED for sudden acute worsening of condition if you do not elect to go at this time.       ED Prescriptions     Medication Sig Dispense Auth. Provider   senna-docusate (SENOKOT-S) 8.6-50 MG tablet Take 2 tablets by mouth 2 (two) times daily. 30 tablet Dontavius Keim, DO   ondansetron (ZOFRAN) 4 MG tablet Take 1 tablet (4 mg total) by mouth every 6 (six) hours. 20 tablet Kedron Uno, DO   traMADol (ULTRAM) 50 MG tablet Take 1 tablet (50 mg total) by mouth every 6 (six) hours as needed. 15 tablet Hildagarde Holleran, DO      I have reviewed the PDMP during this encounter.   Lyndee Hensen, DO 11/09/21 2015

## 2021-11-09 NOTE — ED Triage Notes (Signed)
Pt presents for intermittent nausea and generalized diffuse stomach pain for over one month.  Normal bowel movements, normal belching, no diarrhea. No new meds.  Has not tried any self treatment at home.   Pt states she didn't have periods for approx 4 years then they started again in May.  Is sexually active, no birth control or protection.

## 2021-11-15 ENCOUNTER — Ambulatory Visit (INDEPENDENT_AMBULATORY_CARE_PROVIDER_SITE_OTHER): Payer: 59 | Admitting: Urology

## 2021-11-15 ENCOUNTER — Encounter: Payer: Self-pay | Admitting: Urology

## 2021-11-15 VITALS — BP 118/74 | HR 93 | Ht 63.0 in | Wt 315.0 lb

## 2021-11-15 DIAGNOSIS — N3946 Mixed incontinence: Secondary | ICD-10-CM

## 2021-11-15 DIAGNOSIS — N3942 Incontinence without sensory awareness: Secondary | ICD-10-CM

## 2021-11-15 NOTE — Patient Instructions (Signed)

## 2021-11-15 NOTE — Progress Notes (Signed)
11/15/2021 2:49 PM   [name] [name] December 08, 1970 [id number]  Referring provider: [facility] [address] [place],  [place] [address]  Chief Complaint  Patient presents with   Cysto    HPI: ST: Leaks without awareness.  Has had urodynamic and failed Myrbetriq.  Had a residual of 196 mL after the study but a normal residual prior.  Was given Flomax.  Failed oxybutynin  Today Patient has small-volume urge incontinence.  She says she leaks when she is in a chair.  I could not tell if it was leakage well awareness in a chair or it occurs when she sits.  History was challenging.  No leaking with coughing sneezing.  Has moderately severe bedwetting.  Wears more than 5 pads a day moderately wet  She voids every 60 to 90 minutes.  She gets up 5 times a night.  She has ankle edema.  Flow is reasonable  She is an insulin-dependent diabetic.  No hysterectomy  On urodynamics she did not void and was catheterized for a few milliliters.  Maximum bladder capacity was 440 mL.  Bladder was stable.  She had no stress incontinence generating a Valsalva pressure 103 cm water.  During voluntary voiding she voided 244 mL with maximal flow 12 mils per second.  Max voiding pressure 37 cm of water.  Residual was 196 mL.  Bladder neck descended less than 1 cm.  She did a lot of straining to void and it was prolonged interrupted pattern.  Based upon the presentation appears that 90% of the patient's problem or greater is refractory overactive bladder.  If she does have stress incontinence it would be mild and does not explain leaking when she sits in a chair.  Treatments for stress incontinence would be challenging with body habitus.  Call if culture positive.  Reassess for pelvic examination cystoscopy on Gemtesa in 5 to 6 weeks.  Bedwetting due to overactive bladder.  No recent urine culture.  Body habitus may also make some of the refractory OAB therapies  challenging  Today Frequency stable.  Last urine was positive for low count of Streptococcus And positive today for blood and bacteria. Patient failed Gemtesa Patient is on Eliquis and used vape in the past but no aspirin  Cystoscopy: Patient underwent flexible cystoscopy.  Urethra was very tender but overall she did very well tolerating the procedure.  Bladder mucosa and trigone were normal.  No carcinoma.  No cystitis.       PMH: Past Medical History:  Diagnosis Date   Anemia    Anxiety    Asthma    Depression    Diabetes mellitus without complication ([place])    GERD (gastroesophageal reflux disease)    Hypercholesteremia    Hypertension    Sleep apnea     Surgical History: Past Surgical History:  Procedure Laterality Date   CARDIOVERSION N/A 12/30/2020   Procedure: CARDIOVERSION;  Surgeon: Corey Skains, MD;  Location: [facility];  Service: Cardiovascular;  Laterality: N/A;   COLONOSCOPY     ESOPHAGOGASTRODUODENOSCOPY (EGD) WITH PROPOFOL N/A 06/29/2021   Procedure: ESOPHAGOGASTRODUODENOSCOPY (EGD) WITH PROPOFOL;  Surgeon: Lucilla Lame, MD;  Location: [facility];  Service: Endoscopy;  Laterality: N/A;   TUBAL LIGATION      Home Medications:  Allergies as of 11/15/2021       Reactions   Aspirin Diarrhea   "high doses" gives her diarrhea        Medication List  Accurate as of November 15, 2021  2:49 PM. If you have any questions, ask your nurse or doctor.          Accu-Chek Softclix Lancets lancets CHECK GLUCOSE TWICE DIALY FOR GLUCOSE MONITORING E11.9   Advair Diskus 500-50 MCG/ACT Aepb Generic drug: fluticasone-salmeterol 1 DOSE INHALED TWICE DAILY FOR ASTHMA, RINSE MOUTH OUT WITH WATER AFTE USE   amiodarone 200 MG tablet Commonly known as: PACERONE Take 200 mg by mouth 2 (two) times daily.   amLODipine 10 MG tablet Commonly known as: NORVASC Take 1 tablet by mouth daily.   atorvastatin 20 MG tablet Commonly known as: LIPITOR Take 1  tablet by mouth daily.   Basaglar KwikPen 100 UNIT/ML SMARTSIG:65 Unit(s) SUB-Q Every Night   buPROPion ER 100 MG 12 hr tablet Commonly known as: WELLBUTRIN SR Take 100 mg by mouth 2 (two) times daily.   clotrimazole 1 % cream Commonly known as: LOTRIMIN   Eliquis 5 MG Tabs tablet Generic drug: apixaban Take 5 mg by mouth 2 (two) times daily.   enalapril 20 MG tablet Commonly known as: VASOTEC Take 2 tablets by mouth daily.   ferrous sulfate 325 (65 FE) MG EC tablet 2 tabletsBY MOUTH  A DAY FOR ANEMIA   fluticasone 50 MCG/ACT nasal spray Commonly known as: FLONASE Place 1 spray into both nostrils daily for 7 days.   Gemtesa 75 MG Tabs Generic drug: Vibegron Take 1 tablet by mouth daily.   glipiZIDE 10 MG 24 hr tablet Commonly known as: GLUCOTROL XL Take only tablet by mouth daily for diabetes   glucose blood test strip USE AS DIRECTED TWICE A DAY   ipratropium 0.06 % nasal spray Commonly known as: ATROVENT Place 2 sprays into both nostrils 4 (four) times daily.   Jardiance 25 MG Tabs tablet Generic drug: empagliflozin Take 25 mg by mouth daily.   metFORMIN 500 MG 24 hr tablet Commonly known as: GLUCOPHAGE-XR 2 BY MOUTH TWICE DAILY FOR DIABETES   metoprolol tartrate 25 MG tablet Commonly known as: LOPRESSOR Take 0.5 tablets (12.5 mg total) by mouth 2 (two) times daily.   ondansetron 4 MG tablet Commonly known as: ZOFRAN Take 1 tablet (4 mg total) by mouth every 6 (six) hours.   promethazine-dextromethorphan 6.25-15 MG/5ML syrup Commonly known as: PROMETHAZINE-DM Take 5 mLs by mouth 4 (four) times daily as needed.   senna-docusate 8.6-50 MG tablet Commonly known as: Senokot-S Take 2 tablets by mouth 2 (two) times daily.   tamsulosin 0.4 MG Caps capsule Commonly known as: FLOMAX Take 1 capsule (0.4 mg total) by mouth daily.   traMADol 50 MG tablet Commonly known as: ULTRAM Take 1 tablet (50 mg total) by mouth every 6 (six) hours as needed.         Allergies:  Allergies  Allergen Reactions   Aspirin Diarrhea    "high doses" gives her diarrhea    Family History: Family History  Problem Relation Age of Onset   Breast cancer Cousin     Social History:  reports that she has quit smoking. Her smoking use included cigarettes. She has been exposed to tobacco smoke. She has never used smokeless tobacco. She reports that she does not currently use alcohol. She reports that she does not use drugs.  ROS:  Physical Exam: LMP 10/06/2017 (Approximate) Comment: Pt did not have period for approx 4 years then started periods again this spring, irregular. neg preg test  Constitutional:  Alert and oriented, No acute distress. HEENT: [place] AT, moist mucus membranes.  Trachea midline, no masses.  Laboratory Data: Lab Results  Component Value Date   WBC 7.9 11/09/2021   HGB 15.9 (H) 11/09/2021   HCT 42.8 11/09/2021   MCV 89.7 11/09/2021   PLT 275 11/09/2021    Lab Results  Component Value Date   CREATININE 0.78 11/09/2021    No results found for: "PSA"  No results found for: "TESTOSTERONE"  No results found for: "HGBA1C"  Urinalysis    Component Value Date/Time   COLORURINE RED (A) 11/28/2020 1830   APPEARANCEUR Clear 09/20/2021 1300   LABSPEC 1.032 (H) 11/28/2020 1830   PHURINE  11/28/2020 1830    TEST NOT REPORTED DUE TO COLOR INTERFERENCE OF URINE PIGMENT   GLUCOSEU 3+ (A) 09/20/2021 1300   HGBUR (A) 11/28/2020 1830    TEST NOT REPORTED DUE TO COLOR INTERFERENCE OF URINE PIGMENT   BILIRUBINUR Negative 09/20/2021 1300   KETONESUR (A) 11/28/2020 1830    TEST NOT REPORTED DUE TO COLOR INTERFERENCE OF URINE PIGMENT   PROTEINUR Negative 09/20/2021 1300   PROTEINUR (A) 11/28/2020 1830    TEST NOT REPORTED DUE TO COLOR INTERFERENCE OF URINE PIGMENT   NITRITE Negative 09/20/2021 1300   NITRITE (A) 11/28/2020 1830    TEST NOT REPORTED DUE TO COLOR INTERFERENCE  OF URINE PIGMENT   LEUKOCYTESUR Trace (A) 09/20/2021 1300   LEUKOCYTESUR (A) 11/28/2020 1830    TEST NOT REPORTED DUE TO COLOR INTERFERENCE OF URINE PIGMENT    Pertinent Imaging:   Assessment & Plan: Based on body habitus refractory treatments are to be difficult.  She would not be able to catheterize and certainly we would not be able to do Botox in the office due to tenderness.  She would also need to stop her blood thinners.  Recognizing limitations I gave her Vesicare 5 mg 3x11 and reassess in 6 weeks.  I will have her stand and assess for possible InterStim.  Body habitus may be an issue.  I will order CT scan because of microscopic hematuria and call if abnormal.  Upon further discussion I had the patient's stand and she has a lot of distance in her lower back.  I went over percutaneous tibial nerve stimulation in detail and she would not have to stop her blood thinner.  She want to hear about InterStim and I went through it in detail with full template and she would have to be off the blood thinner for the test done in [place] and surgery done here in [place].  We would need medical clearance for both from her doctor.  She is on Eliquis for atrial fibrillation diagnosed last year.  She understands that I would not be doing this with a bridge.  I urged her to try the ankle treatments and Vesicare but she did not want to try another medication and certainly the chances of it working is much lower  I do not think she should have Botox based on body habitus and Eliquis and atrial fibrillation.  We spent a long time discussing the options.  Patient shows percutaneous tibial nerve stimulation.  She should be encouraged to try the 12 weeks since again limitations are limited and more challenging  1. Urinary incontinence without sensory awareness  - Urinalysis, Complete   No  follow-ups on file.  Reece Packer, MD  [facility] [address], [place],  [address] [phone]

## 2021-11-16 LAB — URINALYSIS, COMPLETE
Bilirubin, UA: NEGATIVE
Nitrite, UA: NEGATIVE
Protein,UA: NEGATIVE
Specific Gravity, UA: 1.01 (ref 1.005–1.030)
Urobilinogen, Ur: 0.2 mg/dL (ref 0.2–1.0)
pH, UA: 5 (ref 5.0–7.5)

## 2021-11-16 LAB — MICROSCOPIC EXAMINATION

## 2021-11-17 LAB — CULTURE, URINE COMPREHENSIVE

## 2021-11-26 ENCOUNTER — Telehealth: Payer: Self-pay

## 2021-11-26 NOTE — Telephone Encounter (Signed)
Pt returned the call would like to schedule PTNS. [phone]

## 2021-11-26 NOTE — Telephone Encounter (Signed)
[employer] Medicare, no PA required. LMOM for pt to return call if she would like to schedule.

## 2021-11-26 NOTE — Telephone Encounter (Signed)
-----   Message from Despina Hidden, [place] sent at 11/15/2021  3:39 PM EDT ----- Regarding: PTNS Please send auth for PTNS for this patient

## 2021-11-28 HISTORY — PX: CATARACT EXTRACTION: SUR2

## 2021-12-02 NOTE — Telephone Encounter (Signed)
LMOM for pt to return call to schedule.

## 2021-12-06 ENCOUNTER — Ambulatory Visit (INDEPENDENT_AMBULATORY_CARE_PROVIDER_SITE_OTHER): Payer: 59 | Admitting: Physician Assistant

## 2021-12-06 DIAGNOSIS — N3946 Mixed incontinence: Secondary | ICD-10-CM

## 2021-12-06 NOTE — Patient Instructions (Signed)
Tracking Your Bladder Symptoms    Patient Name:___________________________________________________   Sample: Day   Daytime Voids  Nighttime Voids Urgency for the Day (none, mild, strong, severe) Number of Accidents Beverage Comments  Monday IIII II Strong I Water IIII Coffee  I      Week Starting:____________________________________   Day Daytime  Voids Nighttime  Voids Urgency for the Day (none, mild, strong, severe) Number of Accidents Beverages Comments                                                           This week my symptoms were:  O much better O better O the same O worse

## 2021-12-06 NOTE — Progress Notes (Signed)
PTNS  Session # 1  Health & Social Factors: no change  Caffeine: 5 Alcohol: occ Daytime voids #per day: 6-7 Night-time voids #per night: 5 Urgency: strong Incontinence Episodes #per day: 5 Ankle used: left Treatment Setting: 1 Feeling/ Response: sensory   Performed By: Barbra Sarks  Follow Up: 1 week

## 2021-12-14 ENCOUNTER — Encounter: Payer: Self-pay | Admitting: Physician Assistant

## 2021-12-14 ENCOUNTER — Ambulatory Visit (INDEPENDENT_AMBULATORY_CARE_PROVIDER_SITE_OTHER): Payer: 59 | Admitting: Physician Assistant

## 2021-12-14 VITALS — Ht 63.0 in | Wt 315.0 lb

## 2021-12-14 DIAGNOSIS — N3946 Mixed incontinence: Secondary | ICD-10-CM | POA: Diagnosis not present

## 2021-12-14 NOTE — Patient Instructions (Signed)
Tracking Your Bladder Symptoms    Patient Name:___________________________________________________   Sample: Day   Daytime Voids  Nighttime Voids Urgency for the Day (none, mild, strong, severe) Number of Accidents Beverage Comments  Monday IIII II Strong I Water IIII Coffee  I      Week Starting:____________________________________   Day Daytime  Voids Nighttime  Voids Urgency for the Day (none, mild, strong, severe) Number of Accidents Beverages Comments                                                           This week my symptoms were:  O much better O better O the same O worse

## 2021-12-14 NOTE — Progress Notes (Signed)
PTNS  Session # 2 of 12  Health & Social Factors: no change  Caffeine: 6-7 Alcohol: 2 Daytime voids #per day: 8-9 Night-time voids #per night: 8-9 Urgency: strong Incontinence Episodes #per day: 0 Ankle used: left Treatment Setting: 5 Feeling/ Response: sensory  Comments: pt tolerated well    Performed By: Breck Coons   Follow Up: 1 week for #3

## 2021-12-20 ENCOUNTER — Ambulatory Visit (INDEPENDENT_AMBULATORY_CARE_PROVIDER_SITE_OTHER): Payer: 59 | Admitting: Urology

## 2021-12-20 DIAGNOSIS — N3946 Mixed incontinence: Secondary | ICD-10-CM

## 2021-12-20 NOTE — Progress Notes (Signed)
PTNS  Session # 3 of 12  Health & Social Factors: No change Caffeine: 4 or more Alcohol: none Daytime voids #per day: 5-7 Night-time voids #per night: 6-8 Urgency: strong Incontinence Episodes #per day: 0 Ankle used: left Treatment Setting: 6 Feeling/ Response: feeling Comments:   Performed By: S.Rodney Wigger, CMA  Follow Up: as directed

## 2021-12-20 NOTE — Patient Instructions (Signed)
Tracking Your Bladder Symptoms    Patient Name:_______[name]______________   Sample: Day   Daytime Voids  Nighttime Voids Urgency for the Day (none, mild, strong, severe) Number of Accidents Beverage Comments  Monday IIII II Strong I Water IIII Coffee  I      Week Starting:____________________________________   Day Daytime  Voids Nighttime  Voids Urgency for the Day (none, mild, strong, severe) Number of Accidents Beverages Comments                                                           This week my symptoms were:  O much better O better O the same O worse

## 2021-12-27 ENCOUNTER — Ambulatory Visit (INDEPENDENT_AMBULATORY_CARE_PROVIDER_SITE_OTHER): Payer: 59 | Admitting: Physician Assistant

## 2021-12-27 ENCOUNTER — Encounter: Payer: Self-pay | Admitting: Physician Assistant

## 2021-12-27 VITALS — BP 112/73 | HR 98 | Ht 63.0 in | Wt 315.0 lb

## 2021-12-27 DIAGNOSIS — N3946 Mixed incontinence: Secondary | ICD-10-CM

## 2021-12-27 NOTE — Progress Notes (Signed)
PTNS   Session # 4 of 12   Health & Social Factors: No change Caffeine: 4 or more Alcohol: none Daytime voids #per day: 5-7 Night-time voids #per night: 6-8 Urgency: strong Incontinence Episodes #per day: 0 Ankle used: left Treatment Setting: 5 Feeling/ Response: feeling Comments:    Performed By: Laterra Lubinski' \\Cma'$    Follow Up: as directed

## 2021-12-27 NOTE — Patient Instructions (Signed)
Tracking Your Bladder Symptoms   Patient Name:___________________________________________________  Example: Day   Daytime Voids  Nighttime Voids Urgency for the Day (none, mild, strong, severe) Number of Accidents Beverage Comments  Monday IIII II Strong I Water IIII Coffee  I     Week Starting:____________________________________  Day Daytime  Voids Nighttime  Voids Urgency for the Day (none, mild, strong, severe) Number of Accidents Beverages Comments                                                           This week my symptoms were:  O much better O better O the same O worse

## 2021-12-29 HISTORY — PX: CATARACT EXTRACTION: SUR2

## 2022-01-03 ENCOUNTER — Ambulatory Visit (INDEPENDENT_AMBULATORY_CARE_PROVIDER_SITE_OTHER): Payer: 59 | Admitting: Physician Assistant

## 2022-01-03 VITALS — Ht 63.0 in | Wt 315.0 lb

## 2022-01-03 DIAGNOSIS — N3946 Mixed incontinence: Secondary | ICD-10-CM | POA: Diagnosis not present

## 2022-01-03 NOTE — Progress Notes (Signed)
PTNS  Session # 5  Health & Social Factors: no change  Caffeine: 4 or more   Alcohol: none Daytime voids #per day: 5-7 Night-time voids #per night: 6-8 Urgency: strong Incontinence Episodes #per day: 0 Ankle used: left Treatment Setting: 8 Feeling/ Response: sensory Comments: pt tolerated well    Performed By: Breck Coons   Follow Up: 1 week follow up 6 of 12

## 2022-01-03 NOTE — Patient Instructions (Signed)
Tracking Your Bladder Symptoms   Patient Name:___________________________________________________  Example: Day   Daytime Voids  Nighttime Voids Urgency for the Day (none, mild, strong, severe) Number of Accidents Beverage Comments  Monday IIII II Strong I Water IIII Coffee  I     Week Starting:____________________________________  Day Daytime  Voids Nighttime  Voids Urgency for the Day (none, mild, strong, severe) Number of Accidents Beverages Comments                                                           This week my symptoms were:  O much better O better O the same O worse

## 2022-01-10 ENCOUNTER — Ambulatory Visit (INDEPENDENT_AMBULATORY_CARE_PROVIDER_SITE_OTHER): Payer: 59 | Admitting: Physician Assistant

## 2022-01-10 DIAGNOSIS — N3946 Mixed incontinence: Secondary | ICD-10-CM

## 2022-01-10 NOTE — Progress Notes (Signed)
PTNS  Session # 6  Health & Social Factors: No change Caffeine: 4+ Alcohol: 0 Daytime voids #per day: 6 Night-time voids #per night: 6 Urgency: Strong Incontinence Episodes #per day: 0 Ankle used: Left Treatment Setting: 8 Feeling/ Response: Sensory Comments: Pt tolerated well, no complications noted.   Performed By: Bradly Bienenstock CMA, Debroah Loop PA-C  Follow Up: RTC in 1 week for PTNS.

## 2022-01-10 NOTE — Patient Instructions (Signed)
Tracking Your Bladder Symptoms   Patient Name:___________________________________________________  Week Starting:____________________________________  Day Daytime  Voids Nighttime  Voids Urgency for the Day (none, mild, strong, severe) Number of Accidents Beverages Comments                                                           This week my symptoms were:  O much better O better O the same O worse

## 2022-01-17 ENCOUNTER — Ambulatory Visit (INDEPENDENT_AMBULATORY_CARE_PROVIDER_SITE_OTHER): Payer: 59 | Admitting: Physician Assistant

## 2022-01-17 DIAGNOSIS — N3946 Mixed incontinence: Secondary | ICD-10-CM

## 2022-01-17 NOTE — Progress Notes (Signed)
PTNS  Session # 7  Health & Social Factors: no change Caffeine: 0 Alcohol: 0 Daytime voids #per day: 9 Night-time voids #per night: 9 Urgency: mild Incontinence Episodes #per day: 0 Ankle used: left Treatment Setting: 4 Feeling/ Response: both Comments: Patient tolerated well.  Performed By: Debroah Loop, PA-C   Follow Up: 1 week for PTNS #8

## 2022-01-17 NOTE — Patient Instructions (Signed)
Tracking Your Bladder Symptoms   Patient Name:___________________________________________________  Example: Day   Daytime Voids  Nighttime Voids Urgency for the Day (none, mild, strong, severe) Number of Accidents Beverage Comments  Monday IIII II Strong I Water IIII Coffee  I     Week Starting:____________________________________  Day Daytime  Voids Nighttime  Voids Urgency for the Day (none, mild, strong, severe) Number of Accidents Beverages Comments                                                           This week my symptoms were:  O much better O better O the same O worse

## 2022-01-19 ENCOUNTER — Ambulatory Visit
Admission: EM | Admit: 2022-01-19 | Discharge: 2022-01-19 | Disposition: A | Discharge status: Home / Self Care | Payer: 59 | Attending: Emergency Medicine | Admitting: Emergency Medicine

## 2022-01-19 DIAGNOSIS — L03012 Cellulitis of left finger: Secondary | ICD-10-CM | POA: Diagnosis not present

## 2022-01-19 MED ORDER — DOXYCYCLINE HYCLATE 100 MG PO CAPS: 100.0000 mg | ORAL_CAPSULE | Freq: Two times a day (BID) | ORAL | 0 refills | Status: AC

## 2022-01-19 MED ORDER — CHLORHEXIDINE GLUCONATE 4 % EX LIQD: Freq: Every day | CUTANEOUS | 0 refills | Status: DC | PRN

## 2022-01-19 NOTE — ED Triage Notes (Signed)
Noticed 2 days ago left pointer finger redness and pain. Doesn't believe she hit it on anything or any recent injury. Now has redness, swelling and slight discoloration to left pointer finger.

## 2022-01-19 NOTE — ED Provider Notes (Signed)
HPI  SUBJECTIVE:  Joan Graves is a right-handed 51 y.o. female who presents with constant throbbing pain, swelling, purplish discoloration along her left index finger nailbed starting 2 days ago.  She does not recall any trauma, insect bite.  She denies swelling of the finger pad, fevers, erythema streaking proximally or drainage from the area.  She has tried Tylenol, ice and cold soaks without improvement in her symptoms.  Symptoms are worse with palpation.  She denies biting her nails, recent hangnail or manicure.  No history of MRSA.  She has a past medical history of diabetes, asthma, atrial fibrillation on Eliquis, hypertension.  [facility].    Past Medical History:  Diagnosis Date   Anemia    Anxiety    Asthma    Depression    Diabetes mellitus without complication ([place])    GERD (gastroesophageal reflux disease)    Hypercholesteremia    Hypertension    Sleep apnea     Past Surgical History:  Procedure Laterality Date   CARDIOVERSION N/A 12/30/2020   Procedure: CARDIOVERSION;  Surgeon: Corey Skains, MD;  Location: [facility] ORS;  Service: Cardiovascular;  Laterality: N/A;   COLONOSCOPY     ESOPHAGOGASTRODUODENOSCOPY (EGD) WITH PROPOFOL N/A 06/29/2021   Procedure: ESOPHAGOGASTRODUODENOSCOPY (EGD) WITH PROPOFOL;  Surgeon: Lucilla Lame, MD;  Location: [facility] ENDOSCOPY;  Service: Endoscopy;  Laterality: N/A;   TUBAL LIGATION      Family History  Problem Relation Age of Onset   Breast cancer Cousin     Social History   Tobacco Use   Smoking status: Former    Types: Cigarettes    Passive exposure: Past   Smokeless tobacco: Never  Vaping Use   Vaping Use: Every day   Substances: Nicotine, Flavoring  Substance Use Topics   Alcohol use: Not Currently    Comment: none last 24hrs   Drug use: Never    No current facility-administered medications for this encounter.  Current Outpatient Medications:    ACCU-CHEK SOFTCLIX LANCETS lancets, CHECK  GLUCOSE TWICE DIALY FOR GLUCOSE MONITORING E11.9, Disp: , Rfl: 5   ADVAIR DISKUS 500-50 MCG/DOSE AEPB, 1 DOSE INHALED TWICE DAILY FOR ASTHMA, RINSE MOUTH OUT WITH WATER AFTE USE, Disp: , Rfl: 5   amLODipine (NORVASC) 10 MG tablet, Take 1 tablet by mouth daily., Disp: , Rfl:    apixaban (ELIQUIS) 5 MG TABS tablet, Take 5 mg by mouth 2 (two) times daily., Disp: , Rfl:    atorvastatin (LIPITOR) 20 MG tablet, Take 1 tablet by mouth daily., Disp: , Rfl:    buPROPion (WELLBUTRIN SR) 100 MG 12 hr tablet, Take 100 mg by mouth 2 (two) times daily., Disp: , Rfl:    chlorhexidine (HIBICLENS) 4 % external liquid, Apply topically daily as needed. Dilute 10-15 mL in water, Use daily when bathing for 1-2 weeks, Disp: 120 mL, Rfl: 0   doxycycline (VIBRAMYCIN) 100 MG capsule, Take 1 capsule (100 mg total) by mouth 2 (two) times daily for 7 days., Disp: 14 capsule, Rfl: 0   enalapril (VASOTEC) 20 MG tablet, Take 2 tablets by mouth daily., Disp: , Rfl:    ferrous sulfate 325 (65 FE) MG EC tablet, 2 tabletsBY MOUTH  A DAY FOR ANEMIA, Disp: , Rfl:    glipiZIDE (GLUCOTROL XL) 10 MG 24 hr tablet, Take only tablet by mouth daily for diabetes, Disp: , Rfl:    glucose blood test strip, USE AS DIRECTED TWICE A DAY, Disp: , Rfl:    Insulin  Glargine (BASAGLAR KWIKPEN) 100 UNIT/ML, SMARTSIG:65 Unit(s) SUB-Q Every Night, Disp: , Rfl:    JARDIANCE 25 MG TABS tablet, Take 25 mg by mouth daily., Disp: , Rfl:    metFORMIN (GLUCOPHAGE-XR) 500 MG 24 hr tablet, 2 BY MOUTH TWICE DAILY FOR DIABETES, Disp: , Rfl: 1   ofloxacin (OCUFLOX) 0.3 % ophthalmic solution, Apply to the operative eye 1 drop four times a day, Disp: , Rfl:    omeprazole (PRILOSEC) 40 MG capsule, Take 40 mg by mouth daily., Disp: , Rfl:    metoprolol tartrate (LOPRESSOR) 25 MG tablet, Take 0.5 tablets (12.5 mg total) by mouth 2 (two) times daily., Disp: 60 tablet, Rfl: 0  Allergies  Allergen Reactions   Aspirin Diarrhea    "high doses" gives her diarrhea      ROS  As noted in HPI.   Physical Exam  BP 107/78 (BP Location: Left Arm)   Pulse 75   Temp 97.8 F (36.6 C) (Oral)   Resp 18   LMP 10/06/2017 (Approximate) Comment: Pt did not have period for approx 4 years then started periods again this spring, irregular. neg preg test  SpO2 97%   Constitutional: Well developed, well nourished, no acute distress Eyes:  EOMI, conjunctiva normal bilaterally HENT: Normocephalic, atraumatic,mucus membranes moist Respiratory: Normal inspiratory effort Cardiovascular: Normal rate GI: nondistended skin: See MSK exam Musculoskeletal: Tender swelling, purplish discoloration with what appears to be pus underneath the skin left index finger.  No fingerpad swelling.  Positive mild tenderness.  No erythema streaking proximally.  2 point discrimination intact.  No tenderness along the flexor tendons.  Neurologic: Alert & oriented x 3, no focal neuro deficits Psychiatric: Speech and behavior appropriate   ED Course   Medications - No data to display  Orders Placed This Encounter  Procedures   Aerobic Culture w Gram Stain (superficial specimen)    Standing Status:   Standing    Number of Occurrences:   1    No results found for this or any previous visit (from the past 24 hour(s)). No results found.  ED Clinical Impression  1. Paronychia of finger of left hand      ED Assessment/Plan     Procedure note: Had patient wash hands thoroughly with chlorhexidine soap and water.  Cleaned the base of the finger with alcohol, injected 1 cc of 1% plain lidocaine for digital block with adequate pain relief.  Used an 11 blade, made a single stab incision and expressed a copious amount of purulent drainage.  Wound culture sent.  Irrigated out with 2 mL of 1% plain lidocaine.  Dressing placed.  Patient tolerated procedure well.  Patient with a paronychia status post I&D.  No evidence of felon today.  Home with doxycycline for 7 days, Hibiclens soap.   Tylenol, warm soaks.  Return here or follow-up with PCP if symptoms return.  Discussed MDM, treatment plan, and plan for follow-up with patient. Discussed sn/sx that should prompt return to the ED. patient agrees with plan.   Meds ordered this encounter  Medications   doxycycline (VIBRAMYCIN) 100 MG capsule    Sig: Take 1 capsule (100 mg total) by mouth 2 (two) times daily for 7 days.    Dispense:  14 capsule    Refill:  0   chlorhexidine (HIBICLENS) 4 % external liquid    Sig: Apply topically daily as needed. Dilute 10-15 mL in water, Use daily when bathing for 1-2 weeks    Dispense:  120 mL  Refill:  0      *This clinic note was created using [employer]. Therefore, there may be occasional mistakes despite careful proofreading.  ?    Melynda Ripple, MD 01/19/22 1446

## 2022-01-19 NOTE — Discharge Instructions (Addendum)
Keep the dressing intact for 48 hours, then you can take it off.  Keep it clean with soap and water, keep it covered during the day when you are out.  Finish the doxycycline, even if you feel better.  Take 1000 mg Tylenol 3 times a day as needed for pain.  Warm compresses or soaks with the Hibiclens soap.  Follow-up with your doctor or here as needed, go to the ER if you get worse

## 2022-01-22 LAB — AEROBIC CULTURE W GRAM STAIN (SUPERFICIAL SPECIMEN)

## 2022-01-24 ENCOUNTER — Ambulatory Visit (INDEPENDENT_AMBULATORY_CARE_PROVIDER_SITE_OTHER): Payer: 59 | Admitting: Physician Assistant

## 2022-01-24 DIAGNOSIS — N3946 Mixed incontinence: Secondary | ICD-10-CM | POA: Diagnosis not present

## 2022-01-24 NOTE — Patient Instructions (Signed)
Tracking Your Bladder Symptoms   Patient Name:___________________________________________________  Example: Day   Daytime Voids  Nighttime Voids Urgency for the Day (none, mild, strong, severe) Number of Accidents Beverage Comments  Monday IIII II Strong I Water IIII Coffee  I     Week Starting:____________________________________  Day Daytime  Voids Nighttime  Voids Urgency for the Day (none, mild, strong, severe) Number of Accidents Beverages Comments                                                           This week my symptoms were:  O much better O better O the same O worse

## 2022-01-24 NOTE — Progress Notes (Signed)
PTNS  Session # 8  Health & Social Factors: no change Caffeine: 0 Alcohol: 0 Daytime voids #per day:7 Night-time voids #per night: 7 Urgency: strong Incontinence Episodes #per day: 0 Ankle used: right Treatment Setting: 7 Feeling/ Response: sensory Comments: Patient tolerated well  Performed By: Debroah Loop, PA-C   Follow Up: 1 week follow up #9

## 2022-01-31 ENCOUNTER — Ambulatory Visit: Payer: 59 | Admitting: Physician Assistant

## 2022-02-01 DIAGNOSIS — F17200 Nicotine dependence, unspecified, uncomplicated: Secondary | ICD-10-CM | POA: Insufficient documentation

## 2022-02-02 NOTE — Progress Notes (Unsigned)
[facility]   No chief complaint on file.   HPI:      Joan Graves is a 51 y.o. G1P0 whose LMP was Patient's last menstrual period was 10/06/2017 (approximate)., presents today for NP eval for PMB, referred by     Patient Active Problem List   Diagnosis Date Noted   Esophageal dysphagia    Problems with swallowing and mastication    Paroxysmal atrial fibrillation ([place]) 12/09/2020   Chronic pulmonary hypertension ([place]) 06/11/2020   Moderate aortic valve stenosis 06/11/2020   Benign essential hypertension 12/02/2014   Hyperlipidemia, mixed 12/11/2013   Obstructive sleep apnea 12/11/2013   Asthma 12/09/2013   Diabetes ([place]) 12/09/2013   Gastroesophageal reflux disease 12/09/2013    Past Surgical History:  Procedure Laterality Date   CARDIOVERSION N/A 12/30/2020   Procedure: CARDIOVERSION;  Surgeon: Corey Skains, MD;  Location: [facility] ORS;  Service: Cardiovascular;  Laterality: N/A;   COLONOSCOPY     ESOPHAGOGASTRODUODENOSCOPY (EGD) WITH PROPOFOL N/A 06/29/2021   Procedure: ESOPHAGOGASTRODUODENOSCOPY (EGD) WITH PROPOFOL;  Surgeon: Lucilla Lame, MD;  Location: [facility];  Service: Endoscopy;  Laterality: N/A;   TUBAL LIGATION      Family History  Problem Relation Age of Onset   Breast cancer Cousin     Social History   Socioeconomic History   Marital status: Married    Spouse name: Not on file   Number of children: Not on file   Years of education: Not on file   Highest education level: Not on file  Occupational History   Not on file  Tobacco Use   Smoking status: Former    Types: Cigarettes    Passive exposure: Past   Smokeless tobacco: Never  Vaping Use   Vaping Use: Every day   Substances: Nicotine, Flavoring  Substance and Sexual Activity   Alcohol use: Not Currently    Comment: none last 24hrs   Drug use: Never   Sexual activity: Yes    Birth control/protection: None  Other Topics Concern   Not on  file  Social History Narrative   Not on file   Social Determinants of Health   Financial Resource Strain: Not on file  Food Insecurity: Not on file  Transportation Needs: Not on file  Physical Activity: Not on file  Stress: Not on file  Social Connections: Not on file  Intimate Partner Violence: Not on file    Outpatient Medications Prior to Visit  Medication Sig Dispense Refill   ACCU-CHEK SOFTCLIX LANCETS lancets CHECK GLUCOSE TWICE DIALY FOR GLUCOSE MONITORING E11.9  5   ADVAIR DISKUS 500-50 MCG/DOSE AEPB 1 DOSE INHALED TWICE DAILY FOR ASTHMA, RINSE MOUTH OUT WITH WATER AFTE USE  5   amLODipine (NORVASC) 10 MG tablet Take 1 tablet by mouth daily.     apixaban (ELIQUIS) 5 MG TABS tablet Take 5 mg by mouth 2 (two) times daily.     atorvastatin (LIPITOR) 20 MG tablet Take 1 tablet by mouth daily.     buPROPion (WELLBUTRIN SR) 100 MG 12 hr tablet Take 100 mg by mouth 2 (two) times daily.     chlorhexidine (HIBICLENS) 4 % external liquid Apply topically daily as needed. Dilute 10-15 mL in water, Use daily when bathing for 1-2 weeks 120 mL 0   enalapril (VASOTEC) 20 MG tablet Take 2 tablets by mouth daily.     ferrous sulfate 325 (65 FE) MG EC tablet 2 tabletsBY MOUTH  A DAY FOR  ANEMIA     glipiZIDE (GLUCOTROL XL) 10 MG 24 hr tablet Take only tablet by mouth daily for diabetes     glucose blood test strip USE AS DIRECTED TWICE A DAY     Insulin Glargine (BASAGLAR KWIKPEN) 100 UNIT/ML SMARTSIG:65 Unit(s) SUB-Q Every Night     JARDIANCE 25 MG TABS tablet Take 25 mg by mouth daily.     metFORMIN (GLUCOPHAGE-XR) 500 MG 24 hr tablet 2 BY MOUTH TWICE DAILY FOR DIABETES  1   metoprolol tartrate (LOPRESSOR) 25 MG tablet Take 0.5 tablets (12.5 mg total) by mouth 2 (two) times daily. 60 tablet 0   ofloxacin (OCUFLOX) 0.3 % ophthalmic solution Apply to the operative eye 1 drop four times a day     omeprazole (PRILOSEC) 40 MG capsule Take 40 mg by mouth daily.     No facility-administered  medications prior to visit.      ROS:  Review of Systems BREAST: No symptoms   OBJECTIVE:   Vitals:  LMP 10/06/2017 (Approximate) Comment: Pt did not have period for approx 4 years then started periods again this spring, irregular. neg preg test  Physical Exam  Results: No results found for this or any previous visit (from the past 24 hour(s)).   Assessment/Plan: No diagnosis found.    No orders of the defined types were placed in this encounter.     No follow-ups on file.  Adelbert Gaspard B. Adien Kimmel, PA-C 02/02/2022 8:52 PM

## 2022-02-03 ENCOUNTER — Other Ambulatory Visit (HOSPITAL_COMMUNITY)
Admission: RE | Admit: 2022-02-03 | Discharge: 2022-02-03 | Disposition: A | Discharge status: Home / Self Care | Payer: 59 | Source: Ambulatory Visit | Attending: Obstetrics and Gynecology | Admitting: Obstetrics and Gynecology

## 2022-02-03 ENCOUNTER — Other Ambulatory Visit (INDEPENDENT_AMBULATORY_CARE_PROVIDER_SITE_OTHER): Payer: 59

## 2022-02-03 ENCOUNTER — Ambulatory Visit (INDEPENDENT_AMBULATORY_CARE_PROVIDER_SITE_OTHER): Payer: 59 | Admitting: Obstetrics and Gynecology

## 2022-02-03 ENCOUNTER — Encounter: Payer: Self-pay | Admitting: Obstetrics and Gynecology

## 2022-02-03 VITALS — BP 124/80 | Ht 63.0 in | Wt 323.0 lb

## 2022-02-03 DIAGNOSIS — Z1151 Encounter for screening for human papillomavirus (HPV): Secondary | ICD-10-CM

## 2022-02-03 DIAGNOSIS — Z780 Asymptomatic menopausal state: Secondary | ICD-10-CM | POA: Diagnosis not present

## 2022-02-03 DIAGNOSIS — Z01419 Encounter for gynecological examination (general) (routine) without abnormal findings: Secondary | ICD-10-CM | POA: Diagnosis present

## 2022-02-03 DIAGNOSIS — N939 Abnormal uterine and vaginal bleeding, unspecified: Secondary | ICD-10-CM

## 2022-02-03 DIAGNOSIS — N950 Postmenopausal bleeding: Secondary | ICD-10-CM | POA: Diagnosis not present

## 2022-02-03 DIAGNOSIS — Z124 Encounter for screening for malignant neoplasm of cervix: Secondary | ICD-10-CM

## 2022-02-03 DIAGNOSIS — Z8041 Family history of malignant neoplasm of ovary: Secondary | ICD-10-CM

## 2022-02-03 NOTE — Patient Instructions (Signed)
I value your feedback and you entrusting us with your care. If you get a [facility] patient survey, I would appreciate you taking the time to let us know about your experience today. Thank you! ? ? ?

## 2022-02-04 LAB — FSH/LH
FSH: 30 m[IU]/mL
LH: 32.4 m[IU]/mL

## 2022-02-04 LAB — ESTRADIOL: Estradiol: 15.2 pg/mL

## 2022-02-07 ENCOUNTER — Ambulatory Visit (INDEPENDENT_AMBULATORY_CARE_PROVIDER_SITE_OTHER): Payer: 59 | Admitting: Physician Assistant

## 2022-02-07 DIAGNOSIS — N3946 Mixed incontinence: Secondary | ICD-10-CM | POA: Diagnosis not present

## 2022-02-07 NOTE — Patient Instructions (Signed)
Tracking Your Bladder Symptoms   Patient Name:___________________________________________________  Example: Day   Daytime Voids  Nighttime Voids Urgency for the Day (none, mild, strong, severe) Number of Accidents Beverage Comments  Monday IIII II Strong I Water IIII Coffee  I     Week Starting:____________________________________  Day Daytime  Voids Nighttime  Voids Urgency for the Day (none, mild, strong, severe) Number of Accidents Beverages Comments                                                           This week my symptoms were:  O much better O better O the same O worse

## 2022-02-07 NOTE — Progress Notes (Addendum)
PTNS  Session # 9 of 12  Health & Social Factors: no change Caffeine: 6-7 Alcohol: occasional Daytime voids #per day: 8 Night-time voids #per night: 7 Urgency: strong Incontinence Episodes #per day: unclear Ankle used: left Treatment Setting: 7 Feeling/ Response: sensory Comments: Patient's voiding diary today again reports 0 daily accidents, but she verbally reported 8 accidents on arrival. I later attempted to clarify this and she stated no, she voids 8 times daily, she doesn't leak 8 times daily. I asked if it was accurate that she doesn't leak at all, and she said no, she leaks some, but she doesn't know how often. She is unable to further quantify this.  Performed By: Debroah Loop, PA-C   Follow Up: 1 week for PTNS #10

## 2022-02-08 LAB — CYTOLOGY - PAP
Comment: NEGATIVE
Diagnosis: NEGATIVE
High risk HPV: NEGATIVE

## 2022-02-08 NOTE — Progress Notes (Signed)
Pls schedule pt with MD for EMB/AUB f/u. She is aware.

## 2022-02-14 ENCOUNTER — Ambulatory Visit (INDEPENDENT_AMBULATORY_CARE_PROVIDER_SITE_OTHER): Payer: 59 | Admitting: Physician Assistant

## 2022-02-14 DIAGNOSIS — N3946 Mixed incontinence: Secondary | ICD-10-CM

## 2022-02-14 NOTE — Patient Instructions (Signed)
Tracking Your Bladder Symptoms   Patient Name:___________________________________________________  Example: Day   Daytime Voids  Nighttime Voids Urgency for the Day (none, mild, strong, severe) Number of Accidents/ Leaks Beverage Comments  Monday IIII II Strong I Water IIII Coffee  I     Week Starting:____________________________________  Day Daytime  Voids Nighttime  Voids Urgency for the Day (none, mild, strong, severe) Number of Accidents/ Leaks Beverages Comments                                                           This week my symptoms were:  O much better O better O the same O worse   

## 2022-02-14 NOTE — Progress Notes (Signed)
PTNS  Session # 10 of 12  Health & Social Factors: no changes Caffeine: 4-6 Alcohol: 4-6 Daytime voids #per day: 9 Night-time voids #per night: 6 Urgency: strong Incontinence Episodes #per day: 8 Ankle used: left Treatment Setting: 9 Feeling/ Response: sensory Comments: Patient tolerated well  Performed By: Elberta Leatherwood, CMA  Follow Up: 1 week #11

## 2022-02-15 ENCOUNTER — Other Ambulatory Visit (HOSPITAL_COMMUNITY)
Admission: RE | Admit: 2022-02-15 | Discharge: 2022-02-15 | Disposition: A | Discharge status: Home / Self Care | Payer: 59 | Source: Ambulatory Visit | Attending: Obstetrics & Gynecology | Admitting: Obstetrics & Gynecology

## 2022-02-15 ENCOUNTER — Encounter: Payer: Self-pay | Admitting: Obstetrics & Gynecology

## 2022-02-15 ENCOUNTER — Ambulatory Visit (INDEPENDENT_AMBULATORY_CARE_PROVIDER_SITE_OTHER): Payer: 59 | Admitting: Obstetrics & Gynecology

## 2022-02-15 VITALS — BP 122/70 | HR 78 | Ht 63.0 in | Wt 325.0 lb

## 2022-02-15 DIAGNOSIS — R9389 Abnormal findings on diagnostic imaging of other specified body structures: Secondary | ICD-10-CM | POA: Insufficient documentation

## 2022-02-15 DIAGNOSIS — C541 Malignant neoplasm of endometrium: Secondary | ICD-10-CM

## 2022-02-15 DIAGNOSIS — N950 Postmenopausal bleeding: Secondary | ICD-10-CM

## 2022-02-15 DIAGNOSIS — Z6841 Body Mass Index (BMI) 40.0 and over, adult: Secondary | ICD-10-CM

## 2022-02-15 NOTE — Progress Notes (Signed)
   Established Patient Office Visit  Subjective   Patient ID: Joan Graves, female    DOB: [birth year]  Age: 51 y.o. MRN: [id number]  No chief complaint on file.   HPI    51 yo married P1 (110 yo daughter) here for an EMBX. She went through menopause in 2019. She then started bleeding monthly again about a year ago. She had an ultrasound that showed a thickened irregular endometrium (11 mm).  She had a BTL in the distant past.  Objective:     BP 122/70   Pulse 78   Ht '5\' 3"'$  (1.6 m)   Wt (!) 325 lb (147.4 kg)   LMP 10/06/2017 (Approximate) Comment: Pt did not have period for approx 4 years then started periods again this spring, irregular. neg preg test  BMI 57.57 kg/m    Physical Exam   No results found for any visits on 02/15/22.    Well nourished, well hydrated White female, no apparent distress She is conversing normally. She uses a cane for ambulation. She was unable to get on a standard exam table, so we used the electric bed for her convenience. BMI 57   Consent signed, time out done Cervix prepped with betadine and sprayed with Hurricaine spray. I then grasped with a single tooth tenaculum. Uterus sounded to 8 cm Pipelle used for 2 passes with a large amount of tissue and dark blood obtained. She tolerated the procedure well.    Assessment & Plan:   Problem List Items Addressed This Visit     PMB (postmenopausal bleeding) - Primary   Other Visit Diagnoses     Morbid obesity with BMI of 50.0-59.9, adult ([place])       Thickened endometrium           She is aware that she is at risk for uterine cancer and that the purpose of the biopsy is to rule out uterine cancer. I told her that if it is cancer, then I will refer her to a gyn onc. She doesn't use My Chart, so she will be contacted when the results are available.   Emily Filbert, MD

## 2022-02-16 ENCOUNTER — Encounter: Payer: Self-pay | Admitting: Obstetrics & Gynecology

## 2022-02-18 LAB — SURGICAL PATHOLOGY

## 2022-02-22 ENCOUNTER — Telehealth: Payer: Self-pay

## 2022-02-22 ENCOUNTER — Ambulatory Visit (INDEPENDENT_AMBULATORY_CARE_PROVIDER_SITE_OTHER): Payer: 59 | Admitting: Physician Assistant

## 2022-02-22 DIAGNOSIS — N3946 Mixed incontinence: Secondary | ICD-10-CM

## 2022-02-22 NOTE — Telephone Encounter (Signed)
Patient contacted office requesting to know results of recent surgical pathology reported dated 02/15/22 performed by Dr. Hulan Fray. Dr. Hulan Fray is not in the office this week and it does not appear that she will be back in office until mid January. Can you please review results and advise? Thanks. KW

## 2022-02-22 NOTE — Progress Notes (Signed)
PTNS   Session # 11 of 12   Health & Social Factors: no changes Caffeine: 4-6 Alcohol: 4-6 Daytime voids #per day: 9 Night-time voids #per night: 6 Urgency: strong Incontinence Episodes #per day: 8 Ankle used: left Treatment Setting: 6 Feeling/ Response: sensory Comments: Patient tolerated well   Performed By: Gaspar Cola CMA   Follow Up: 1 week #12

## 2022-02-22 NOTE — Patient Instructions (Signed)
Tracking Your Bladder Symptoms   Patient Name:___________________________________________________  Example: Day   Daytime Voids  Nighttime Voids Urgency for the Day (none, mild, strong, severe) Number of Accidents/ Leaks Beverage Comments  Monday IIII II Strong I Water IIII Coffee  I     Week Starting:____________________________________  Day Daytime  Voids Nighttime  Voids Urgency for the Day (none, mild, strong, severe) Number of Accidents/ Leaks Beverages Comments                                                           This week my symptoms were:  O much better O better O the same O worse   

## 2022-02-23 ENCOUNTER — Other Ambulatory Visit: Payer: Self-pay | Admitting: Obstetrics & Gynecology

## 2022-02-23 DIAGNOSIS — C55 Malignant neoplasm of uterus, part unspecified: Secondary | ICD-10-CM

## 2022-02-23 NOTE — Progress Notes (Signed)
Gyn onc referral placed. Patient will be notified this morning.

## 2022-02-23 NOTE — Telephone Encounter (Signed)
Contacted patient regarding her pathology report. Patient was last seen by Dr. Clovia Cuff for PMB and thickened endometrium, who is currently out of the office.  Given information regarding her Pathology report noting endometrioid adenocarcinoma. Discussed need for referral to GYN Oncology.  Patient notes understanding. States someone else attempted to reach out to her this morning telling her about her pathology but no telephone encounter seen in [facility].  Patient aware of need for strict follow up.

## 2022-02-24 ENCOUNTER — Telehealth: Payer: Self-pay | Admitting: *Deleted

## 2022-02-24 ENCOUNTER — Encounter: Payer: Self-pay | Admitting: Psychiatry

## 2022-02-24 NOTE — Telephone Encounter (Signed)
Patient called back and scheduled an new patient appt with Dr Ernestina Patches on 1/3 at 10:30 am. Patient given an arrival time of 10 am.  Explained to the patient the the doctor will perform a pelvic exam at this visit. Patient given the policy that no visitors under the 16 yrs are allowed in the [facility]. Patient given the address/phone number for the clinic and that the center offers free valet service. Patient aware of the new mask mandate.

## 2022-02-24 NOTE — Telephone Encounter (Signed)
LMOM for the patient to call the office back. Patient needs to be scheduled for a new patient appt

## 2022-03-02 ENCOUNTER — Encounter: Payer: Self-pay | Admitting: Psychiatry

## 2022-03-02 ENCOUNTER — Encounter: Payer: 59 | Admitting: Gynecologic Oncology

## 2022-03-02 ENCOUNTER — Inpatient Hospital Stay: Payer: 59 | Attending: Psychiatry | Admitting: Psychiatry

## 2022-03-02 ENCOUNTER — Inpatient Hospital Stay: Payer: 59

## 2022-03-02 VITALS — BP 129/65 | HR 77 | Temp 97.8°F | Resp 16 | Ht 64.96 in | Wt 331.0 lb

## 2022-03-02 DIAGNOSIS — E119 Type 2 diabetes mellitus without complications: Secondary | ICD-10-CM | POA: Diagnosis not present

## 2022-03-02 DIAGNOSIS — C541 Malignant neoplasm of endometrium: Secondary | ICD-10-CM | POA: Insufficient documentation

## 2022-03-02 DIAGNOSIS — F32A Depression, unspecified: Secondary | ICD-10-CM | POA: Insufficient documentation

## 2022-03-02 DIAGNOSIS — Z7901 Long term (current) use of anticoagulants: Secondary | ICD-10-CM | POA: Insufficient documentation

## 2022-03-02 DIAGNOSIS — Z87891 Personal history of nicotine dependence: Secondary | ICD-10-CM | POA: Diagnosis not present

## 2022-03-02 DIAGNOSIS — E1169 Type 2 diabetes mellitus with other specified complication: Secondary | ICD-10-CM

## 2022-03-02 DIAGNOSIS — F419 Anxiety disorder, unspecified: Secondary | ICD-10-CM | POA: Insufficient documentation

## 2022-03-02 DIAGNOSIS — G4730 Sleep apnea, unspecified: Secondary | ICD-10-CM | POA: Insufficient documentation

## 2022-03-02 DIAGNOSIS — E7800 Pure hypercholesterolemia, unspecified: Secondary | ICD-10-CM | POA: Diagnosis not present

## 2022-03-02 DIAGNOSIS — K219 Gastro-esophageal reflux disease without esophagitis: Secondary | ICD-10-CM | POA: Insufficient documentation

## 2022-03-02 DIAGNOSIS — I272 Pulmonary hypertension, unspecified: Secondary | ICD-10-CM | POA: Diagnosis not present

## 2022-03-02 DIAGNOSIS — Z7984 Long term (current) use of oral hypoglycemic drugs: Secondary | ICD-10-CM | POA: Diagnosis not present

## 2022-03-02 DIAGNOSIS — I350 Nonrheumatic aortic (valve) stenosis: Secondary | ICD-10-CM | POA: Diagnosis not present

## 2022-03-02 DIAGNOSIS — Z6841 Body Mass Index (BMI) 40.0 and over, adult: Secondary | ICD-10-CM | POA: Insufficient documentation

## 2022-03-02 DIAGNOSIS — Z794 Long term (current) use of insulin: Secondary | ICD-10-CM | POA: Insufficient documentation

## 2022-03-02 DIAGNOSIS — Z01818 Encounter for other preprocedural examination: Secondary | ICD-10-CM

## 2022-03-02 DIAGNOSIS — Z79899 Other long term (current) drug therapy: Secondary | ICD-10-CM | POA: Insufficient documentation

## 2022-03-02 DIAGNOSIS — I480 Paroxysmal atrial fibrillation: Secondary | ICD-10-CM | POA: Diagnosis not present

## 2022-03-02 DIAGNOSIS — I10 Essential (primary) hypertension: Secondary | ICD-10-CM | POA: Diagnosis not present

## 2022-03-02 LAB — CBC (CANCER CENTER ONLY)
HCT: 40.1 % (ref 36.0–46.0)
Hemoglobin: 13.9 g/dL (ref 12.0–15.0)
MCH: 31.7 pg (ref 26.0–34.0)
MCHC: 34.7 g/dL (ref 30.0–36.0)
MCV: 91.6 fL (ref 80.0–100.0)
Platelet Count: 244 10*3/uL (ref 150–400)
RBC: 4.38 MIL/uL (ref 3.87–5.11)
RDW: 16.5 % — ABNORMAL HIGH (ref 11.5–15.5)
WBC Count: 5.2 10*3/uL (ref 4.0–10.5)
nRBC: 0 % (ref 0.0–0.2)

## 2022-03-02 LAB — CMP (CANCER CENTER ONLY)
ALT: 17 U/L (ref 0–44)
AST: 15 U/L (ref 15–41)
Albumin: 4.2 g/dL (ref 3.5–5.0)
Alkaline Phosphatase: 91 U/L (ref 38–126)
Anion gap: 6 (ref 5–15)
BUN: 10 mg/dL (ref 6–20)
CO2: 31 mmol/L (ref 22–32)
Calcium: 9.4 mg/dL (ref 8.9–10.3)
Chloride: 104 mmol/L (ref 98–111)
Creatinine: 0.69 mg/dL (ref 0.44–1.00)
GFR, Estimated: >60 mL/min (ref >60)
Glucose, Bld: 68 mg/dL — ABNORMAL LOW (ref 70–99)
Potassium: 4 mmol/L (ref 3.5–5.1)
Sodium: 141 mmol/L (ref 135–145)
Total Bilirubin: 0.4 mg/dL (ref 0.3–1.2)
Total Protein: 7.2 g/dL (ref 6.5–8.1)

## 2022-03-02 MED ORDER — TRAMADOL HCL 50 MG PO TABS: 50.0000 mg | ORAL_TABLET | Freq: Four times a day (QID) | ORAL | 0 refills | Status: DC | PRN

## 2022-03-02 MED ORDER — SENNOSIDES-DOCUSATE SODIUM 8.6-50 MG PO TABS: 2.0000 | ORAL_TABLET | Freq: Every day | ORAL | 0 refills | Status: DC

## 2022-03-02 NOTE — Progress Notes (Unsigned)
GYNECOLOGIC ONCOLOGY NEW PATIENT CONSULTATION  Date of Service: 03/02/2022 Referring Provider: Clovia Cuff, MD   ASSESSMENT AND PLAN: Joan Graves is a 52 y.o. woman with FIGO gr 2 endometrial cancer.  We reviewed the nature of endometrial cancer and its recommended surgical staging, including total hysterectomy, bilateral salpingo-oophorectomy, and lymph node assessment. The patient is may be  suitable candidate for staging via a minimally invasive approach to surgery.  We reviewed that robotic assistance would be used to complete the surgery.   We discussed that given her morbid obesity with central adiposity (BMI 55) and her medical comorbidities, particularly AS and pulm HTN, she may not tolerate trendelenburg. We will plan for her to obtain clearance from her cardiologist prior to procedure. If unable to tolerate T-burg, would plan D&C and IUD as a temporizing measure as we discussed that with FIGO gr2 this is not ideal definitive management. If open procedure required ultimately, would likely require panniculectomy. Will plan to tape down her pannus with robotic approach.   We discussed that most endometrial cancer is detected early and that decisions regarding adjuvant therapy will be made based on her final pathology.   We reviewed the sentinel lymph node technique. Risks and benefits of sentinel lymph node biopsy was reviewed. We reviewed the technique and ICG dye. The patient DOES NOT have an iodine allergy or known liver dysfunction. We reviewed the false negative rate (0.4%), and that 3% of patients with metastatic disease will not have it detected by SLN biopsy in endometrial cancer. A low risk of allergic reaction to the dye, <0.2% for ICG, has been reported. We also discussed that in the case of failed mapping, which occurs 40% of the time, a bilateral or unilateral lymphadenectomy will be performed at the surgeon's discretion.   Potential benefits of sentinel nodes including  a higher detection rate for metastasis due to ultrastaging and potential reduction in operative morbidity. However, there remains uncertainty as to the role for treatment of micrometastatic disease. Further, the benefit of operative morbidity associated with the SLN technique in endometrial cancer is not yet completely known. In other patient populations (e.g. the cervical cancer population) there has been observed reductions in morbidity with SLN biopsy compared to pelvic lymphadenectomy. Lymphedema, nerve dysfunction and lymphocysts are all potential risks with the SLN technique as with complete lymphadenectomy. Additional risks to the patient include the risk of damage to an internal organ while operating in an altered view (e.g. the black and white image of the robotic fluorescence imaging mode).   Patient was consented for: Robotic assisted total laparoscopic hysterectomy, bilateral salpingo-oophorectomy, sentinel lymph node evaluation and biopsy, possible lymph node dissection, possible dilation and curettage with intrauterine device insertion on 03/29/22.  The risks of surgery were discussed in detail and she understands these to including but not limited to bleeding requiring a blood transfusion, infection, injury to adjacent organs (including but not limited to the bowels, bladder, ureters, nerves, blood vessels), thromboembolic events, wound separation, hernia, vaginal cuff separation, possible risk of lymphedema and lymphocyst if lymphadenectomy performed, unforseen complication, possible need for re-exploration, and medical complications such as heart attack, stroke, pneumonia.  If the patient experiences any of these events, she understands that her hospitalization or recovery may be prolonged and that she may need to take additional medications for a prolonged period. The patient will receive DVT and antibiotic prophylaxis as indicated. She voiced a clear understanding. She had the opportunity to  ask questions and written informed consent  was obtained today. She wishes to proceed.  She will proceed to the lab today for CBC, CMP, A1c.  She requires preoperative clearance by cardiology.   A copy of this note was sent to the patient's referring provider.  Bernadene Bell, MD Gynecologic Oncology   Medical Decision Making I personally spent  TOTAL 50 minutes face-to-face and non-face-to-face in the care of this patient, which includes all pre, intra, and post visit time on the date of service.   ------------  CC: Endometrial cancer  HISTORY OF PRESENT ILLNESS:  Joan Graves is a 52 y.o. woman who is seen in consultation at the request of Clovia Cuff, MD for evaluation of new diagnosis of endometrial cancer.  Patient presented to her OB/GYN for postmenopausal bleeding.  Patient reported she went through menopause in 2019 but then resumed some bleeding over a year ago.  She had an ultrasound performed which showed a thickened irregular endometrium measuring 11 mm.  She underwent an endometrial biopsy on 02/15/2022 which returned with FIGO grade 2 endometrioid adenocarcinoma, p53wt. Pap was also collectd on 02/03/22 and NILM, HPV neg.   Today, pt presents with her husband. She reports ongoing bleeding but denies abdominal bloating, early satiety, significant weight loss, change in bowel or bladder habits.   Pt otherwise has a history of paroxysmal a fib for which she is on eliquis. She is also noted to have moderate aortic valve stenosis and chronic pulmonary hypertension. Her cardiologist is Dr. Serafina Royals, who is retiring,  last seen 12/07/21. She also has a history of diabetes followed by [facility]. She isnt sure when her last A1c was but believes maybe about a year ago and at 7.8 or 7.9.    PAST MEDICAL HISTORY: Past Medical History:  Diagnosis Date   Anemia    Anxiety    Aortic valve stenosis    Asthma    Chronic pulmonary hypertension  ([place])    Depression    Diabetes mellitus without complication (HCC)    GERD (gastroesophageal reflux disease)    Hypercholesteremia    Hypertension    Paroxysmal atrial fibrillation ([place])    Sleep apnea     PAST SURGICAL HISTORY: Past Surgical History:  Procedure Laterality Date   CARDIOVERSION N/A 12/30/2020   Procedure: CARDIOVERSION;  Surgeon: Corey Skains, MD;  Location: [facility] ORS;  Service: Cardiovascular;  Laterality: N/A;   CATARACT EXTRACTION Right 11/2021   CATARACT EXTRACTION Left 12/2021   COLONOSCOPY     ESOPHAGOGASTRODUODENOSCOPY (EGD) WITH PROPOFOL N/A 06/29/2021   Procedure: ESOPHAGOGASTRODUODENOSCOPY (EGD) WITH PROPOFOL;  Surgeon: Lucilla Lame, MD;  Location: [facility] ENDOSCOPY;  Service: Endoscopy;  Laterality: N/A;   TUBAL LIGATION      OB/GYN HISTORY: OB History  Gravida Para Term Preterm AB Living  '1 1 1     1  '$ SAB IAB Ectopic Multiple Live Births          1    # Outcome Date GA Lbr Len/2nd Weight Sex Delivery Anes PTL Lv  1 Term      Vag-Spont   LIV      Age at menarche: 60 Age at menopause: 2 or 62 Hx of HRT: No Hx of STI: No Last pap: NILM, HPV neg 02/03/22 History of abnormal pap smears: no  SCREENING STUDIES:  Last mammogram: 05/2021 Last colonoscopy: 2007  MEDICATIONS:  Current Outpatient Medications:    ACCU-CHEK SOFTCLIX LANCETS lancets, CHECK GLUCOSE TWICE DIALY FOR GLUCOSE MONITORING E11.9, Disp: ,  Rfl: 5   ADVAIR DISKUS 500-50 MCG/DOSE AEPB, 1 DOSE INHALED TWICE DAILY FOR ASTHMA, RINSE MOUTH OUT WITH WATER AFTE USE, Disp: , Rfl: 5   amLODipine (NORVASC) 10 MG tablet, Take 1 tablet by mouth daily., Disp: , Rfl:    apixaban (ELIQUIS) 5 MG TABS tablet, Take 5 mg by mouth 2 (two) times daily., Disp: , Rfl:    atorvastatin (LIPITOR) 20 MG tablet, Take 1 tablet by mouth daily., Disp: , Rfl:    buPROPion (WELLBUTRIN SR) 100 MG 12 hr tablet, Take 100 mg by mouth 2 (two) times daily., Disp: , Rfl:    enalapril (VASOTEC) 20 MG tablet, Take 2  tablets by mouth daily., Disp: , Rfl:    ferrous sulfate 325 (65 FE) MG EC tablet, 2 tabletsBY MOUTH  A DAY FOR ANEMIA, Disp: , Rfl:    glipiZIDE (GLUCOTROL XL) 10 MG 24 hr tablet, Take only tablet by mouth daily for diabetes, Disp: , Rfl:    glucose blood test strip, USE AS DIRECTED TWICE A DAY, Disp: , Rfl:    Insulin Glargine (BASAGLAR KWIKPEN) 100 UNIT/ML, SMARTSIG:65 Unit(s) SUB-Q Every Night, Disp: , Rfl:    JARDIANCE 25 MG TABS tablet, Take 25 mg by mouth daily., Disp: , Rfl:    metFORMIN (GLUCOPHAGE-XR) 500 MG 24 hr tablet, 2 BY MOUTH TWICE DAILY FOR DIABETES, Disp: , Rfl: 1   omeprazole (PRILOSEC) 40 MG capsule, Take 40 mg by mouth daily., Disp: , Rfl:    senna-docusate (SENOKOT-S) 8.6-50 MG tablet, Take 2 tablets by mouth at bedtime. For AFTER surgery, do not take if having diarrhea, Disp: 30 tablet, Rfl: 0   traMADol (ULTRAM) 50 MG tablet, Take 1 tablet (50 mg total) by mouth every 6 (six) hours as needed for severe pain. For AFTER surgery only, do not take and drive, Disp: 10 tablet, Rfl: 0  ALLERGIES: Allergies  Allergen Reactions   Aspirin Diarrhea    "high doses" gives her diarrhea    FAMILY HISTORY: Family History  Problem Relation Age of Onset   Ovarian cancer Mother        mid 15s   Lung cancer Mother 16   Throat cancer Maternal Grandmother    Colon cancer Neg Hx    Breast cancer Neg Hx    Endometrial cancer Neg Hx    Pancreatic cancer Neg Hx    Prostate cancer Neg Hx     SOCIAL HISTORY: Social History   Socioeconomic History   Marital status: Married    Spouse name: Not on file   Number of children: Not on file   Years of education: Not on file   Highest education level: Not on file  Occupational History   Not on file  Tobacco Use   Smoking status: Former    Types: Cigarettes    Passive exposure: Past   Smokeless tobacco: Never  Vaping Use   Vaping Use: Every day   Substances: Nicotine, Flavoring  Substance and Sexual Activity   Alcohol use: Not  Currently    Comment: occ   Drug use: Never   Sexual activity: Yes    Birth control/protection: None, Surgical    Comment: Tubal Ligation  Other Topics Concern   Not on file  Social History Narrative   Not on file   Social Determinants of Health   Financial Resource Strain: Not on file  Food Insecurity: Not on file  Transportation Needs: Not on file  Physical Activity: Not on file  Stress: Not  on file  Social Connections: Not on file  Intimate Partner Violence: Not on file    REVIEW OF SYSTEMS: New patient intake form was reviewed.  Complete 10-system review is negative except for the following: Diarrhea with aspirin, anxiety, vaginal bleeding, depression  PHYSICAL EXAM: BP 129/65 (BP Location: Left Wrist, Patient Position: Sitting)   Pulse 77   Temp 97.8 F (36.6 C) (Oral)   Resp 16   Ht 5' 4.96" (1.65 m)   Wt (!) 331 lb (150.1 kg)   LMP 10/06/2017 (Approximate) Comment: Pt did not have period for approx 4 years then started periods again this spring, irregular. neg preg test  SpO2 99%   BMI 55.15 kg/m  Constitutional: No acute distress. Neuro/Psych: Alert, oriented.  Head and Neck: Normocephalic, atraumatic. Neck symmetric without masses. Sclera anicteric.  Respiratory: Normal work of breathing. Clear to auscultation bilaterally. Cardiovascular: Regular rate and rhythm, no murmurs, rubs, or gallops. Abdomen: Normoactive bowel sounds. Soft, non-distended, non-tender to palpation.  Extremities: Grossly normal range of motion. Warm, well perfused.  Skin: No rashes or lesions. Lymphatic: No cervical, supraclavicular, or inguinal adenopathy. Genitourinary: External genitalia without lesions. Urethral meatus without lesions or prolapse. On speculum exam, vagina and cervix without lesions. Bimanual exam reveals normal cervix by palpation. Uterus unable to be discretely palpated due to body habitus. Rectovaginal exam confirms the above findings and reveals normal sphincter  tone and no masses or nodularity. Exam chaperoned by Kimberly Martinique, CMA   LABORATORY AND RADIOLOGIC DATA: Outside medical records were reviewed to synthesize the above history, along with the history and physical obtained during the visit.  Outside laboratory, pathology, and imaging reports were reviewed, with pertinent results below.  I personally reviewed the outside images.  WBC  Date Value Ref Range Status  11/09/2021 7.9 4.0 - 10.5 K/uL Final   WBC Count  Date Value Ref Range Status  03/02/2022 5.2 4.0 - 10.5 K/uL Final   Hemoglobin  Date Value Ref Range Status  03/02/2022 13.9 12.0 - 15.0 g/dL Final   HCT  Date Value Ref Range Status  03/02/2022 40.1 36.0 - 46.0 % Final   Platelet Count  Date Value Ref Range Status  03/02/2022 244 150 - 400 K/uL Final   Magnesium  Date Value Ref Range Status  12/07/2020 1.8 1.7 - 2.4 mg/dL Final    Comment:    Performed at [facility], [address] [place], [place] [address]   Creatinine  Date Value Ref Range Status  03/02/2022 0.69 0.44 - 1.00 mg/dL Final   AST  Date Value Ref Range Status  03/02/2022 15 15 - 41 U/L Final   ALT  Date Value Ref Range Status  03/02/2022 17 0 - 44 U/L Final   Endometrial biopsy (02/15/22): A. ENDOMETRIUM, BIOPSY:  - Endometrioid adenocarcinoma, FIGO grade 2 (see comment)   COMMENT:   Appropriately controlled p53 immunohistochemical stain shows wild-type  staining.  This case was reviewed with Dr.Patrick who agrees with the  above diagnosis.   Pap (02/03/22): NILM, HPV HR neg   US PELVIS TRANSVAGINAL NON-OB (TV ONLY) 02/03/2022  Narrative Patient Name: Joan Graves DOB: Dec 14, 1970 MRN: [id number]   ULTRASOUND REPORT  Location: [facility] Date of Service: 02/03/2022    Indications:Abnormal Uterine Bleeding Findings: The uterus is anteverted and measures 7.3 x 4.0 x 3.7 cm. Echo texture is homogenous without evidence of focal  masses.  The Endometrium measures 11.2 mm. - very irregular borders - non-vascular -  thickened into cervical end, 16.8 mm  Right Ovary is not normal visualized Left Ovary measures 1.6 x 1.3 x 0.9 cm. It is normal in appearance. Survey of the adnexa demonstrates no adnexal masses. There is no free fluid in the cul de sac.  Impression: 1. Irregular, thickened endometrium extending into cervical region  Recommendations: 1.Clinical correlation with the patient's History and Physical Exam.   Edwena Bunde, RDMS, RVT   I have reviewed this study and agree with documented findings. Consider endometrial sampling in the setting of postmenopausal or abnormal uterine bleeding with thinckened endometrium.   Rubie Maid, MD [facility] at [facility]

## 2022-03-02 NOTE — Patient Instructions (Addendum)
Preparing for your Surgery  Plan for surgery on March 29, 2022 with Dr. Bernadene Bell at [facility] will be scheduled for robotic assisted total laparoscopic hysterectomy (removal of the uterus and cervix), bilateral salpingo-oophorectomy (removal of both ovaries and fallopian tubes), sentinel lymph node biopsy, possible lymph node dissection, possible laparotomy (larger incision on your abdomen if needed), possible dilation and curettage of the uterus with Mirena intrauterine device placement.   We will let you know what cardiology recommends in regards to when to stop your Eliquis. You will need to be off Jardiance for 3 days before surgery.  Plan to have a CT scan prior to surgery and we will contact you with the results.  Pre-operative Testing -You will receive a phone call from presurgical testing at [facility] to arrange for a pre-operative appointment and lab work.  -Bring your insurance card, copy of an advanced directive if applicable, medication list  -At that visit, you will be asked to sign a consent for a possible blood transfusion in case a transfusion becomes necessary during surgery.  The need for a blood transfusion is rare but having consent is a necessary part of your care.     -Do not take supplements such as fish oil (omega 3), red yeast rice, turmeric before your surgery. You want to avoid medications with aspirin in them including headache powders such as BC or Goody's), Excedrin migraine.  Day Before Surgery at [place] will be asked to take in a light diet the day before surgery. You will be advised you can have clear liquids up until 3 hours before your surgery.    Eat a light diet the day before surgery.  Examples including soups, broths, toast, yogurt, mashed potatoes.  AVOID GAS PRODUCING FOODS. Things to avoid include carbonated beverages (fizzy beverages, sodas), raw fruits and raw vegetables (uncooked), or beans.   If your bowels are  filled with gas, your surgeon will have difficulty visualizing your pelvic organs which increases your surgical risks.  Your role in recovery Your role is to become active as soon as directed by your doctor, while still giving yourself time to heal.  Rest when you feel tired. You will be asked to do the following in order to speed your recovery:  - Cough and breathe deeply. This helps to clear and expand your lungs and can prevent pneumonia after surgery.  - [place]. Do mild physical activity. Walking or moving your legs help your circulation and body functions return to normal. Do not try to get up or walk alone the first time after surgery.   -If you develop swelling on one leg or the other, pain in the back of your leg, redness/warmth in one of your legs, please call the office or go to the Emergency Room to have a doppler to rule out a blood clot. For shortness of breath, chest pain-seek care in the Emergency Room as soon as possible. - Actively manage your pain. Managing your pain lets you move in comfort. We will ask you to rate your pain on a scale of zero to 10. It is your responsibility to tell your doctor or nurse where and how much you hurt so your pain can be treated.  Special Considerations -If you are diabetic, you may be placed on insulin after surgery to have closer control over your blood sugars to promote healing and recovery.  This does not mean that you will be discharged  on insulin.  If applicable, your oral antidiabetics will be resumed when you are tolerating a solid diet.  -Your final pathology results from surgery should be available around one week after surgery and the results will be relayed to you when available.  -Dr. Lahoma Crocker is the surgeon that assists your GYN Oncologist with surgery.  If you end up staying the night, the next day after your surgery you will either see Dr. Berline Lopes, Dr. Ernestina Patches, or Dr. Lahoma Crocker.  -FMLA forms can  be faxed to [phone] and please allow 5-7 business days for completion.  Pain Management After Surgery -You have been prescribed your pain medication and bowel regimen medications before surgery so that you can have these available when you are discharged from the hospital. The pain medication is for use ONLY AFTER surgery and a new prescription will not be given.   -Make sure that you have Tylenol IF YOU ARE ABLE TO TAKE THESE MEDICATION at home to use on a regular basis after surgery for pain control.   -Review the attached handout on narcotic use and their risks and side effects.   Bowel Regimen -You have been prescribed Sennakot-S to take nightly to prevent constipation especially if you are taking the narcotic pain medication intermittently.  It is important to prevent constipation and drink adequate amounts of liquids. You can stop taking this medication when you are not taking pain medication and you are back on your normal bowel routine.  Risks of Surgery Risks of surgery are low but include bleeding, infection, damage to surrounding structures, re-operation, blood clots, and very rarely death.   Blood Transfusion Information (For the consent to be signed before surgery)  We will be checking your blood type before surgery so in case of emergencies, we will know what type of blood you would need.                                            WHAT IS A BLOOD TRANSFUSION?  A transfusion is the replacement of blood or some of its parts. Blood is made up of multiple cells which provide different functions. Red blood cells carry oxygen and are used for blood loss replacement. White blood cells fight against infection. Platelets control bleeding. Plasma helps clot blood. Other blood products are available for specialized needs, such as hemophilia or other clotting disorders. BEFORE THE TRANSFUSION  Who gives blood for transfusions?  You may be able to donate blood to be used at a  later date on yourself (autologous donation). Relatives can be asked to donate blood. This is generally not any safer than if you have received blood from a stranger. The same precautions are taken to ensure safety when a relative's blood is donated. Healthy volunteers who are fully evaluated to make sure their blood is safe. This is blood bank blood. Transfusion therapy is the safest it has ever been in the practice of medicine. Before blood is taken from a donor, a complete history is taken to make sure that person has no history of diseases nor engages in risky social behavior (examples are intravenous drug use or sexual activity with multiple partners). The donor's travel history is screened to minimize risk of transmitting infections, such as malaria. The donated blood is tested for signs of infectious diseases, such as HIV and hepatitis. The blood is then tested to be  sure it is compatible with you in order to minimize the chance of a transfusion reaction. If you or a relative donates blood, this is often done in anticipation of surgery and is not appropriate for emergency situations. It takes many days to process the donated blood. RISKS AND COMPLICATIONS Although transfusion therapy is very safe and saves many lives, the main dangers of transfusion include:  Getting an infectious disease. Developing a transfusion reaction. This is an allergic reaction to something in the blood you were given. Every precaution is taken to prevent this. The decision to have a blood transfusion has been considered carefully by your caregiver before blood is given. Blood is not given unless the benefits outweigh the risks.  AFTER SURGERY INSTRUCTIONS (these may change based on procedure performed)  Return to work: 4-6 weeks if applicable  Activity: 1. Be up and out of the bed during the day.  Take a nap if needed.  You may walk up steps but be careful and use the hand rail.  Stair climbing will tire you more than  you think, you may need to stop part way and rest.   2. No lifting or straining for 6 weeks over 10 pounds. No pushing, pulling, straining for 6 weeks.  3. No driving for around 1 week(s).  Do not drive if you are taking narcotic pain medicine and make sure that your reaction time has returned.   4. You can shower as soon as the next day after surgery. Shower daily.  Use your regular soap and water (not directly on the incision) and pat your incision(s) dry afterwards; don't rub.  No tub baths or submerging your body in water until cleared by your surgeon. If you have the soap that was given to you by pre-surgical testing that was used before surgery, you do not need to use it afterwards because this can irritate your incisions.   5. No sexual activity and nothing in the vagina for 12 weeks.  6. You may experience a small amount of clear drainage from your incisions, which is normal.  If the drainage persists, increases, or changes color please call the office.  7. Do not use creams, lotions, or ointments such as neosporin on your incisions after surgery until advised by your surgeon because they can cause removal of the dermabond glue on your incisions.    8. You may experience vaginal spotting after surgery or around the 6-8 week mark from surgery when the stitches at the top of the vagina begin to dissolve.  The spotting is normal but if you experience heavy bleeding, call our office.  9. Take Tylenol first for pain if you are able to take these medications and only use narcotic pain medication for severe pain not relieved by the Tylenol.  Monitor your Tylenol intake to a max of 4,000 mg in a 24 hour period.   Diet: 1. Low sodium Heart Healthy Diet is recommended but you are cleared to resume your normal (before surgery) diet after your procedure.  2. It is safe to use a laxative, such as Miralax or Colace, if you have difficulty moving your bowels. You have been prescribed Sennakot-S to take  at bedtime every evening after surgery to keep bowel movements regular and to prevent constipation.    Wound Care: 1. Keep clean and dry.  Shower daily.  Reasons to call the Doctor: Fever - Oral temperature greater than 100.4 degrees Fahrenheit Foul-smelling vaginal discharge Difficulty urinating Nausea and vomiting Increased  pain at the site of the incision that is unrelieved with pain medicine. Difficulty breathing with or without chest pain New calf pain especially if only on one side Sudden, continuing increased vaginal bleeding with or without clots.   Contacts: For questions or concerns you should contact:  Dr. Bernadene Bell at [place], NP at [phone]  After Hours: call [phone] and have the GYN Oncologist paged/contacted (after 5 pm or on the weekends).  Messages sent via mychart are for non-urgent matters and are not responded to after hours so for urgent needs, please call the after hours number.

## 2022-03-03 ENCOUNTER — Ambulatory Visit (INDEPENDENT_AMBULATORY_CARE_PROVIDER_SITE_OTHER): Payer: 59 | Admitting: Physician Assistant

## 2022-03-03 ENCOUNTER — Telehealth: Payer: Self-pay | Admitting: *Deleted

## 2022-03-03 DIAGNOSIS — N3946 Mixed incontinence: Secondary | ICD-10-CM | POA: Diagnosis not present

## 2022-03-03 NOTE — Patient Instructions (Signed)
Tracking Your Bladder Symptoms   Patient Name:___________________________________________________  Example: Day   Daytime Voids  Nighttime Voids Urgency for the Day (none, mild, strong, severe) Number of Accidents/ Leaks Beverage Comments  Monday IIII II Strong I Water IIII Coffee  I     Week Starting:____________________________________  Day Daytime  Voids Nighttime  Voids Urgency for the Day (none, mild, strong, severe) Number of Accidents/ Leaks Beverages Comments                                                           This week my symptoms were:  O much better O better O the same O worse   

## 2022-03-03 NOTE — Telephone Encounter (Signed)
Per Dr Ernestina Patches fax surgical optimization form to patient's cardiology office

## 2022-03-03 NOTE — Progress Notes (Addendum)
PTNS   Session # 12 of 12   Health & Social Factors: no changes Caffeine: 4-6 Alcohol: 4-6 Daytime voids #per day: 7 Night-time voids #per night: 5 Urgency: strong Incontinence Episodes #per day: 8 Ankle used: left Treatment Setting: 10 Feeling/ Response: sensory Comments: Patient tolerated well   Performed By:  Gaspar Cola CMA   Follow Up: 1 month

## 2022-03-04 LAB — HEMOGLOBIN A1C
Hgb A1c MFr Bld: 6.7 % — ABNORMAL HIGH (ref 4.8–5.6)
Mean Plasma Glucose: 146 mg/dL

## 2022-03-06 ENCOUNTER — Encounter: Payer: Self-pay | Admitting: Psychiatry

## 2022-03-07 ENCOUNTER — Other Ambulatory Visit: Payer: Self-pay | Admitting: Gynecologic Oncology

## 2022-03-07 ENCOUNTER — Telehealth: Payer: Self-pay | Admitting: *Deleted

## 2022-03-07 DIAGNOSIS — C541 Malignant neoplasm of endometrium: Secondary | ICD-10-CM

## 2022-03-07 NOTE — Telephone Encounter (Signed)
Rescheduled patient's CT scan that she canceled. CT 1/10 at 4 pm at [facility]; patient given the date/time/instructions

## 2022-03-09 ENCOUNTER — Ambulatory Visit
Admission: RE | Admit: 2022-03-09 | Discharge: 2022-03-09 | Disposition: A | Discharge status: Home / Self Care | Payer: 59 | Source: Ambulatory Visit | Attending: Psychiatry | Admitting: Psychiatry

## 2022-03-09 ENCOUNTER — Ambulatory Visit: Payer: 59

## 2022-03-09 DIAGNOSIS — C541 Malignant neoplasm of endometrium: Secondary | ICD-10-CM | POA: Diagnosis present

## 2022-03-09 MED ORDER — IOHEXOL 300 MG/ML  SOLN
100.0000 mL | Freq: Once | INTRAMUSCULAR | Status: AC | PRN
  Administered 2022-03-09: 100 mL via INTRAVENOUS

## 2022-03-12 ENCOUNTER — Other Ambulatory Visit: Payer: Self-pay | Admitting: Gynecologic Oncology

## 2022-03-12 DIAGNOSIS — C541 Malignant neoplasm of endometrium: Secondary | ICD-10-CM

## 2022-03-14 DIAGNOSIS — Z7984 Long term (current) use of oral hypoglycemic drugs: Secondary | ICD-10-CM | POA: Insufficient documentation

## 2022-03-14 DIAGNOSIS — Z79899 Other long term (current) drug therapy: Secondary | ICD-10-CM | POA: Diagnosis not present

## 2022-03-14 DIAGNOSIS — Z7901 Long term (current) use of anticoagulants: Secondary | ICD-10-CM | POA: Insufficient documentation

## 2022-03-14 DIAGNOSIS — I10 Essential (primary) hypertension: Secondary | ICD-10-CM | POA: Insufficient documentation

## 2022-03-14 DIAGNOSIS — R197 Diarrhea, unspecified: Secondary | ICD-10-CM | POA: Diagnosis not present

## 2022-03-14 DIAGNOSIS — J45909 Unspecified asthma, uncomplicated: Secondary | ICD-10-CM | POA: Diagnosis not present

## 2022-03-14 DIAGNOSIS — Z794 Long term (current) use of insulin: Secondary | ICD-10-CM | POA: Diagnosis not present

## 2022-03-14 DIAGNOSIS — Z8542 Personal history of malignant neoplasm of other parts of uterus: Secondary | ICD-10-CM | POA: Insufficient documentation

## 2022-03-14 DIAGNOSIS — E119 Type 2 diabetes mellitus without complications: Secondary | ICD-10-CM | POA: Diagnosis not present

## 2022-03-14 NOTE — Progress Notes (Addendum)
COVID Vaccine received:  _0  No _1  Yes Date of any COVID positive Test in last 90 days: None  PCP - [facility]  [place], [place] [address]  [phone] (Work)  [phone] (Fax)   Cardiologist - Serafina Royals, MD, retiring, unknown new MD   Jettie Booze, PA=C [address]  [facility]  [place], [place] [address]  [phone] (Work)  [phone] (Fax)    Chest x-ray - 12-07-2020  1 v   epic EKG - 03-15-2022  Epic Stress Test - Myoview  01-06-2021  Epic ECHO - 12-15-2020  Epic Cardiac Cath -   PCR screen: _2  Ordered & Completed                      _3   No Order but Needs PROFEND                      _4   N/A for this surgery  Surgery Plan:  _5  Ambulatory                            _6  Outpatient in bed                            _7  Admit  Anesthesia:    _8  General  _9  Spinal                           _10   Choice _11   MAC  Bowel Prep - _12  No  _13   Yes _____________  Pacemaker / ICD device _14  No _15  Yes        Device order form faxed _16  No    _17   Yes      Faxed to:  Spinal Cord Stimulator:_18  No _19  Yes      (Remind patient to bring remote DOS) Other Implants:   History of Sleep Apnea? _20  No _21  Yes   CPAP used?- _22  No _23  Yes    Does the patient monitor blood sugar? _24  No _25  Yes  _26  N/A Does patient have a [employer] or Dexacom? _27  No _28  Yes   Fasting Blood Sugar Ranges- 85-125 Checks Blood Sugar __2  times a day  Last dose of GLP1 agonist-  GLP1 instructions:  Last dose of SGLT-2 inhibitors-  SGLT-2 instructions:   Blood Thinner / Instructions:Eliquis  stop 3 days before surgery. Patient is aware Aspirin Instructions:  none  ERAS Protocol Ordered: _29  No  _30  Yes PRE-SURGERY _31  ENSURE  _32  G2  _33  No Drink Ordered  Patient is to be NPO after:08:00 am   Comments: Patient was seen in the ED on 03-15-22 for diarrhea that has gone on x 2 months despite her taking Lomotil and Imodium.  She had labs and EKG at that ED visit.   We only did he T&S at her PST visit.   Activity level: Patient can not climb a flight of stairs without difficulty; she would have SOB but no CP. Patient can perform ADLs without assistance.   Anesthesia review: DM, OSA (CPAP), HTN, A.fib w RVR (cardioversion 12-30-2020) , anemia, anxiety, Chronic pulm. HTN, and patient vapes several times each day.   Patient denies shortness of breath, fever, cough and chest pain at PAT appointment.  Patient verbalized understanding and agreement to the Pre-Surgical Instructions that  were given to them at this PAT appointment. Patient was also educated of the need to review these PAT instructions again prior to his/her surgery.I reviewed the appropriate phone numbers to call if they have any and questions or concerns.

## 2022-03-14 NOTE — ED Triage Notes (Signed)
Ambulatory to triage with c/o diarrhea and epigastric abd pain " "since Jan 21, 2022."  Pt states seen PCP and provided stool samples and evaluation came back.  Pt reports 6 episodes of diarrhea each day, every day, since Jan 21, 2022.

## 2022-03-15 ENCOUNTER — Emergency Department: Payer: 59

## 2022-03-15 ENCOUNTER — Emergency Department
Admission: EM | Admit: 2022-03-15 | Discharge: 2022-03-15 | Disposition: A | Discharge status: Home / Self Care | Payer: 59 | Attending: Emergency Medicine | Admitting: Emergency Medicine

## 2022-03-15 DIAGNOSIS — R197 Diarrhea, unspecified: Secondary | ICD-10-CM

## 2022-03-15 LAB — C DIFFICILE QUICK SCREEN W PCR REFLEX
C Diff antigen: NEGATIVE
C Diff interpretation: NOT DETECTED
C Diff toxin: NEGATIVE

## 2022-03-15 LAB — URINALYSIS, ROUTINE W REFLEX MICROSCOPIC
Bacteria, UA: NONE SEEN
Bilirubin Urine: NEGATIVE
Glucose, UA: >=500 mg/dL — AB
Ketones, ur: NEGATIVE mg/dL
Nitrite: NEGATIVE
Protein, ur: NEGATIVE mg/dL
Specific Gravity, Urine: 1.023 (ref 1.005–1.030)
pH: 5 (ref 5.0–8.0)

## 2022-03-15 LAB — CBC WITH DIFFERENTIAL/PLATELET
Abs Immature Granulocytes: 0.02 10*3/uL (ref 0.00–0.07)
Basophils Absolute: 0 10*3/uL (ref 0.0–0.1)
Basophils Relative: 0 %
Eosinophils Absolute: 0.1 10*3/uL (ref 0.0–0.5)
Eosinophils Relative: 2 %
HCT: 44.6 % (ref 36.0–46.0)
Hemoglobin: 14.2 g/dL (ref 12.0–15.0)
Immature Granulocytes: 0 %
Lymphocytes Relative: 28 %
Lymphs Abs: 1.9 10*3/uL (ref 0.7–4.0)
MCH: 29.2 pg (ref 26.0–34.0)
MCHC: 31.8 g/dL (ref 30.0–36.0)
MCV: 91.8 fL (ref 80.0–100.0)
Monocytes Absolute: 0.4 10*3/uL (ref 0.1–1.0)
Monocytes Relative: 6 %
Neutro Abs: 4.4 10*3/uL (ref 1.7–7.7)
Neutrophils Relative %: 64 %
Platelets: 261 10*3/uL (ref 150–400)
RBC: 4.86 MIL/uL (ref 3.87–5.11)
RDW: 15.6 % — ABNORMAL HIGH (ref 11.5–15.5)
WBC: 6.9 10*3/uL (ref 4.0–10.5)
nRBC: 0 % (ref 0.0–0.2)

## 2022-03-15 LAB — COMPREHENSIVE METABOLIC PANEL
ALT: 20 U/L (ref 0–44)
AST: 16 U/L (ref 15–41)
Albumin: 3.9 g/dL (ref 3.5–5.0)
Alkaline Phosphatase: 93 U/L (ref 38–126)
Anion gap: 9 (ref 5–15)
BUN: 14 mg/dL (ref 6–20)
CO2: 25 mmol/L (ref 22–32)
Calcium: 9 mg/dL (ref 8.9–10.3)
Chloride: 103 mmol/L (ref 98–111)
Creatinine, Ser: 0.78 mg/dL (ref 0.44–1.00)
GFR, Estimated: >60 mL/min (ref >60)
Glucose, Bld: 167 mg/dL — ABNORMAL HIGH (ref 70–99)
Potassium: 4 mmol/L (ref 3.5–5.1)
Sodium: 137 mmol/L (ref 135–145)
Total Bilirubin: 0.6 mg/dL (ref 0.3–1.2)
Total Protein: 7.9 g/dL (ref 6.5–8.1)

## 2022-03-15 LAB — GASTROINTESTINAL PANEL BY PCR, STOOL (REPLACES STOOL CULTURE)

## 2022-03-15 LAB — TROPONIN I (HIGH SENSITIVITY): Troponin I (High Sensitivity): 8 ng/L (ref <18)

## 2022-03-15 LAB — LIPASE, BLOOD: Lipase: 40 U/L (ref 11–51)

## 2022-03-15 MED ORDER — IOHEXOL 350 MG/ML SOLN
125.0000 mL | Freq: Once | INTRAVENOUS | Status: AC | PRN
  Administered 2022-03-15: 125 mL via INTRAVENOUS

## 2022-03-15 MED ORDER — ONDANSETRON HCL 4 MG/2ML IJ SOLN
4.0000 mg | Freq: Once | INTRAMUSCULAR | Status: AC
  Administered 2022-03-15: 4 mg via INTRAVENOUS
  Filled 2022-03-15: qty 2

## 2022-03-15 MED ORDER — SODIUM CHLORIDE 0.9 % IV BOLUS (SEPSIS)
1000.0000 mL | Freq: Once | INTRAVENOUS | Status: AC
  Administered 2022-03-15: 1000 mL via INTRAVENOUS

## 2022-03-15 MED ORDER — MORPHINE SULFATE (PF) 4 MG/ML IV SOLN
4.0000 mg | Freq: Once | INTRAVENOUS | Status: AC
  Administered 2022-03-15: 4 mg via INTRAVENOUS
  Filled 2022-03-15: qty 1

## 2022-03-15 NOTE — ED Notes (Signed)
Pt reports taking lomotil and imodium without relief.  Pt continues to have diarrhea.    No vomiting.  Sx for 2 months   pt in hallway bed  pt alert

## 2022-03-15 NOTE — ED Notes (Signed)
Pt declined for vital signs to be taken at d/c time.

## 2022-03-15 NOTE — Patient Instructions (Addendum)
SURGICAL WAITING ROOM VISITATION Patients having surgery or a procedure may have no more than 2 support people in the waiting area - these visitors may rotate in the visitor waiting room.   Due to an increase in RSV and influenza rates and associated hospitalizations, children ages 72 and under may not visit patients in [place]. If the patient needs to stay at the hospital during part of their recovery, the visitor guidelines for inpatient rooms apply.  PRE-OP VISITATION  Pre-op nurse will coordinate an appropriate time for 1 support person to accompany the patient in pre-op.  This support person may not rotate.  This visitor will be contacted when the time is appropriate for the visitor to come back in the pre-op area.  Please refer to the [facility] website for the visitor guidelines for Inpatients (after your surgery is over and you are in a regular room).  You are not required to quarantine at this time prior to your surgery. However, you must do this: Hand Hygiene often Do NOT share personal items Notify your provider if you are in close contact with someone who has COVID or you develop fever 100.4 or greater, new onset of sneezing, cough, sore throat, shortness of breath or body aches.  If you test positive for Covid or have been in contact with anyone that has tested positive in the last 10 days please notify you surgeon.    Your procedure is scheduled on:  Tuesday  March 29, 2022  Report to [facility] Main Entrance: [place] entrance where the [employer] is available.   Report to admitting at:  08:45 am  +++++Call this number if you have any questions or problems the morning of surgery [phone]  Do not eat food after Midnight the night prior to your surgery/procedure.  After Midnight you may have the following liquids until 08:00 AM DAY OF SURGERY  Clear Liquid Diet Water Black Coffee (sugar ok, NO MILK/CREAM OR CREAMERS)  Tea (sugar ok, NO  MILK/CREAM OR CREAMERS) regular and decaf                             Plain Jell-O  with no fruit (NO RED)                                           Fruit ices (not with fruit pulp, NO RED)                                     Popsicles (NO RED)                                                                  Juice: apple, WHITE grape, WHITE cranberry Sports drinks like Gatorade or Powerade (NO RED)               FOLLOW BOWEL PREP AND ANY ADDITIONAL PRE OP INSTRUCTIONS YOU RECEIVED FROM YOUR SURGEON'S OFFICE!!! Eat a light diet the day before surgery.  Examples including soups, broths, toast, yogurt, mashed  potatoes.  AVOID GAS PRODUCING FOODS. Things to avoid include carbonated beverages (fizzy beverages, sodas), raw fruits and raw vegetables (uncooked), or beans.      Oral Hygiene is also important to reduce your risk of infection.        Remember - BRUSH YOUR TEETH THE MORNING OF SURGERY WITH YOUR REGULAR TOOTHPASTE  Do NOT smoke after Midnight the night before surgery.  How to Manage Your Diabetes Before and After Surgery  Why is it important to control my blood sugar before and after surgery? Improving blood sugar levels before and after surgery helps healing and can limit problems. A way of improving blood sugar control is eating a healthy diet by:  Eating less sugar and carbohydrates  Increasing activity/exercise  Talking with your doctor about reaching your blood sugar goals High blood sugars (greater than 180 mg/dL) can raise your risk of infections and slow your recovery, so you will need to focus on controlling your diabetes during the weeks before surgery. Make sure that the doctor who takes care of your diabetes knows about your planned surgery including the date and location.  How do I manage my blood sugar before surgery? Check your blood sugar at least 4 times a day, starting 2 days before surgery, to make sure that the level is not too high or low. Check your blood sugar  the morning of your surgery when you wake up and every 2 hours until you get to the Short Stay unit. If your blood sugar is less than 70 mg/dL, you will need to treat for low blood sugar: Do not take insulin. Treat a low blood sugar (less than 70 mg/dL) with  cup of clear juice (cranberry or apple), 4 glucose tablets, OR glucose gel. Recheck blood sugar in 15 minutes after treatment (to make sure it is greater than 70 mg/dL). If your blood sugar is not greater than 70 mg/dL on recheck, call [phone] for further instructions. Report your blood sugar to the short stay nurse when you get to Short Stay.  If you are admitted to the hospital after surgery: Your blood sugar will be checked by the staff and you will probably be given insulin after surgery (instead of oral diabetes medicines) to make sure you have good blood sugar levels. The goal for blood sugar control after surgery is 80-180 mg/dL.   WHAT DO I DO ABOUT MY DIABETES MEDICATION?        METFORMIN:  take usual dose the day before your surgery(03-28-22). DO NOT TAKE the day of your surgery  (03-29-22)       GLIPIZIDE:  Take this medicine in the morning on the DAY BEFORE your Surgery( 03-28-22).  DO NOT TAKE the day of your Surgery (03-29-22)       Insulin Basaglar : Take 32 units the evening before your surgery (03-28-22).        JARDIANCE:  DO NOT TAKE this medicine either the DAY BEFORE your Surgery (03-28-22) or the [place] (03-29-22)  If your CBG is greater than 220 mg/dL, you may take  of your sliding scale  (correction) dose of insulin.    IF you have any questions, call the nurse at [phone]  Take ONLY these medicines the morning of surgery with A SIP OF WATER: Omeprazole, amlodipine, Bupropion. You may use your Advair Diskus if needed.     If You have been diagnosed with Sleep Apnea - Bring CPAP mask and tubing day of surgery. We will  provide you with a CPAP machine on the day of your surgery.                    You may not have any metal on your body including hair pins, jewelry, and body piercing  Do not wear make-up, lotions, powders, perfumes or deodorant  Do not wear nail polish including gel and S&S, artificial / acrylic nails, or any other type of covering on natural nails including finger and toenails. If you have artificial nails, gel coating, etc., that needs to be removed by a nail salon, Please have this removed prior to surgery. Not doing so may mean that your surgery could be cancelled or delayed if the Surgeon or anesthesia staff feels like they are unable to monitor you safely.   Do not shave 48 hours prior to surgery to avoid nicks in your skin which may contribute to postoperative infections.   Patients discharged on the day of surgery will not be allowed to drive home.  Someone NEEDS to stay with you for the first 24 hours after anesthesia.  Do not bring your home medications to the hospital. The Pharmacy will dispense medications listed on your medication list to you during your admission in the Hospital.  Special Instructions: Bring a copy of your healthcare power of attorney and living will documents the day of surgery, if you wish to have them scanned into your [facility] Records- EPIC  Please read over the following fact sheets you were given: IF YOU HAVE QUESTIONS ABOUT YOUR PRE-OP INSTRUCTIONS, PLEASE CALL [place] - Preparing for Surgery Before surgery, you can play an important role.  Because skin is not sterile, your skin needs to be as free of germs as possible.  You can reduce the number of germs on your skin by washing with CHG (chlorahexidine gluconate) soap before surgery.  CHG is an antiseptic cleaner which kills germs and bonds with the skin to continue killing germs even after washing. Please DO NOT use if you have an allergy to CHG or antibacterial soaps.  If your skin becomes reddened/irritated stop using the CHG and inform your nurse  when you arrive at Short Stay. Do not shave (including legs and underarms) for at least 48 hours prior to the first CHG shower.  You may shave your face/neck.  Please follow these instructions carefully:  1.  Shower with CHG Soap the night before surgery and the  morning of surgery.  2.  If you choose to wash your hair, wash your hair first as usual with your normal  shampoo.  3.  After you shampoo, rinse your hair and body thoroughly to remove the shampoo.                             4.  Use CHG as you would any other liquid soap.  You can apply chg directly to the skin and wash.  Gently with a scrungie or clean washcloth.  5.  Apply the CHG Soap to your body ONLY FROM THE NECK DOWN.   Do not use on face/ open                           Wound or open sores. Avoid contact with eyes, ears mouth and genitals (private parts).  Wash face,  Genitals (private parts) with your normal soap.             6.  Wash thoroughly, paying special attention to the area where your  surgery  will be performed.  7.  Thoroughly rinse your body with warm water from the neck down.  8.  DO NOT shower/wash with your normal soap after using and rinsing off the CHG Soap.            9.  Pat yourself dry with a clean towel.            10.  Wear clean pajamas.            11.  Place clean sheets on your bed the night of your first shower and do not  sleep with pets.  ON THE DAY OF SURGERY : Do not apply any lotions/deodorants the morning of surgery.  Please wear clean clothes to the hospital/surgery center.    FAILURE TO FOLLOW THESE INSTRUCTIONS MAY RESULT IN THE CANCELLATION OF YOUR SURGERY  PATIENT SIGNATURE_________________________________  NURSE SIGNATURE__________________________________  ________________________________________________________________________

## 2022-03-15 NOTE — ED Notes (Signed)
Went over d/c paperwork at this time with patient. Pt had no questions, comments or concerns after review and verbally understood them. Went over d/c paperwork at this time with patient. Pt had no questions, comments or concerns after review and verbally understood them.

## 2022-03-15 NOTE — ED Provider Notes (Addendum)
[facility] Provider Note    Event Date/Time   First MD Initiated Contact with Patient 03/15/22 0133     (approximate)   History   Diarrhea   HPI  Joan Graves is a 52 y.o. female with history of morbid obesity, hypertension, diabetes, hyperlipidemia, chronic pulmonary hypertension, paroxysmal A-fib on Eliquis, endometrial cancer who presents to the emergency department with 6 episodes of diarrhea every day since the end of November 2023.  States she has seen her PCP and had negative stool cultures.  Has not been on antibiotics.  No recent travel out of the country.  Complains of upper abdominal pain.  No vomiting.  No fever.  Has not seen gastroenterology.  Is taking Lomotil and Imodium without relief.   History provided by patient.    Past Medical History:  Diagnosis Date   Anemia    Anxiety    Aortic valve stenosis    Asthma    Chronic pulmonary hypertension ([place])    Depression    Diabetes mellitus without complication ([place])    GERD (gastroesophageal reflux disease)    Hypercholesteremia    Hypertension    Paroxysmal atrial fibrillation ([place])    Sleep apnea     Past Surgical History:  Procedure Laterality Date   CARDIOVERSION N/A 12/30/2020   Procedure: CARDIOVERSION;  Surgeon: Corey Skains, MD;  Location: [facility] ORS;  Service: Cardiovascular;  Laterality: N/A;   CATARACT EXTRACTION Right 11/2021   CATARACT EXTRACTION Left 12/2021   COLONOSCOPY     ESOPHAGOGASTRODUODENOSCOPY (EGD) WITH PROPOFOL N/A 06/29/2021   Procedure: ESOPHAGOGASTRODUODENOSCOPY (EGD) WITH PROPOFOL;  Surgeon: Lucilla Lame, MD;  Location: [facility] ENDOSCOPY;  Service: Endoscopy;  Laterality: N/A;   TUBAL LIGATION      MEDICATIONS:  Prior to Admission medications   Medication Sig Start Date End Date Taking? Authorizing Provider  ACCU-CHEK SOFTCLIX LANCETS lancets CHECK GLUCOSE TWICE DIALY FOR GLUCOSE MONITORING E11.9 06/09/17   [provider]   ADVAIR DISKUS 500-50 MCG/DOSE AEPB 1 DOSE INHALED TWICE DAILY FOR ASTHMA, RINSE MOUTH OUT WITH WATER AFTE USE 04/24/17   [provider]  amLODipine (NORVASC) 10 MG tablet Take 1 tablet by mouth daily. 10/17/18   [provider]  apixaban (ELIQUIS) 5 MG TABS tablet Take 5 mg by mouth 2 (two) times daily.    [provider]  atorvastatin (LIPITOR) 20 MG tablet Take 1 tablet by mouth daily. 10/04/19   [provider]  buPROPion (WELLBUTRIN SR) 100 MG 12 hr tablet Take 100 mg by mouth 2 (two) times daily. 08/10/20   [provider]  enalapril (VASOTEC) 20 MG tablet Take 2 tablets by mouth daily. 02/13/20   [provider]  ferrous sulfate 325 (65 FE) MG EC tablet 2 tabletsBY MOUTH  A DAY FOR ANEMIA 11/30/15   [provider]  glipiZIDE (GLUCOTROL XL) 10 MG 24 hr tablet Take only tablet by mouth daily for diabetes 08/14/15   [provider]  glucose blood test strip USE AS DIRECTED TWICE A DAY 07/08/15   [provider]  Insulin Glargine (BASAGLAR KWIKPEN) 100 UNIT/ML SMARTSIG:65 Unit(s) SUB-Q Every Night 07/16/20   [provider]  JARDIANCE 25 MG TABS tablet Take 25 mg by mouth daily. 07/16/20   [provider]  metFORMIN (GLUCOPHAGE-XR) 500 MG 24 hr tablet 2 BY MOUTH TWICE DAILY FOR DIABETES 05/26/17   [provider]  omeprazole (PRILOSEC) 40 MG capsule Take 40 mg by mouth daily. 12/15/21  [provider]  senna-docusate (SENOKOT-S) 8.6-50 MG tablet Take 2 tablets by mouth at bedtime. For AFTER surgery, do not take if having diarrhea 03/02/22   Joylene John D, NP  traMADol (ULTRAM) 50 MG tablet Take 1 tablet (50 mg total) by mouth every 6 (six) hours as needed for severe pain. For AFTER surgery only, do not take and drive 03/31/17   Joylene John D, NP    Physical Exam   Triage Vital Signs: ED Triage Vitals [03/15/22 0001]  Enc Vitals Group     BP (!) 145/63     Pulse Rate 87     Resp  18     Temp 97.6 F (36.4 C)     Temp Source Oral     SpO2 99 %     Weight (!) 330 lb (149.7 kg)     Height '5\' 5"'$  (1.651 m)     Head Circumference      Peak Flow      Pain Score 7     Pain Loc      Pain Edu?      Excl. in [place]?     Most recent vital signs: Vitals:   03/15/22 0001  BP: (!) 145/63  Pulse: 87  Resp: 18  Temp: 97.6 F (36.4 C)  SpO2: 99%    CONSTITUTIONAL: Alert and oriented and responds appropriately to questions.  Chronically ill-appearing but in no distress, afebrile HEAD: Normocephalic, atraumatic EYES: Conjunctivae clear, pupils appear equal, sclera nonicteric ENT: normal nose; moist mucous membranes NECK: Supple, normal ROM CARD: RRR; S1 and S2 appreciated; + systolic murmur, no clicks, no rubs, no gallops RESP: Normal chest excursion without splinting or tachypnea; breath sounds clear and equal bilaterally; no wheezes, no rhonchi, no rales, no hypoxia or respiratory distress, speaking full sentences ABD/GI: Tender throughout the upper abdomen but exam limited due to body habitus.  No guarding or rebound. BACK: The back appears normal EXT: Normal ROM in all joints SKIN: Normal color for age and race; warm; no rash on exposed skin NEURO: Moves all extremities equally, normal speech PSYCH: The patient's mood and manner are appropriate.   ED Results / Procedures / Treatments   LABS: (all labs ordered are listed, but only abnormal results are displayed) Labs Reviewed  CBC WITH DIFFERENTIAL/PLATELET - Abnormal; Notable for the following components:      Result Value   RDW 15.6 (*)    All other components within normal limits  COMPREHENSIVE METABOLIC PANEL - Abnormal; Notable for the following components:   Glucose, Bld 167 (*)    All other components within normal limits  URINALYSIS, ROUTINE W REFLEX MICROSCOPIC - Abnormal; Notable for the following components:   Color, Urine STRAW (*)    APPearance CLEAR (*)    Glucose, UA >=500 (*)    Hgb urine  dipstick MODERATE (*)    Leukocytes,Ua TRACE (*)    All other components within normal limits  GASTROINTESTINAL PANEL BY PCR, STOOL (REPLACES STOOL CULTURE)  C DIFFICILE QUICK SCREEN W PCR REFLEX    LIPASE, BLOOD  TROPONIN I (HIGH SENSITIVITY)     EKG:  EKG Interpretation  Date/Time:  Tuesday March 15 2022 00:08:20 EST Ventricular Rate:  103 PR Interval:    QRS Duration: 86 QT Interval:  344 QTC Calculation: 450 R Axis:   91 Text Interpretation: Atrial fibrillation with rapid ventricular response Rightward axis Possible Inferior infarct , age undetermined Cannot rule out Anterior infarct , age undetermined Abnormal ECG  When compared with ECG of 30-Dec-2020 08:43, PREVIOUS ECG IS PRESENT Confirmed by Pryor Curia [phone]) on 03/15/2022 1:35:05 AM         RADIOLOGY: My personal review and interpretation of imaging: CT abdomen pelvis shows no acute abnormality.  I have personally reviewed all radiology reports.   CT ABDOMEN PELVIS W CONTRAST  Result Date: 03/15/2022 CLINICAL DATA:  Abdominal pain, acute, nonlocalized EXAM: CT ABDOMEN AND PELVIS WITH CONTRAST TECHNIQUE: Multidetector CT imaging of the abdomen and pelvis was performed using the standard protocol following bolus administration of intravenous contrast. RADIATION DOSE REDUCTION: This exam was performed according to the departmental dose-optimization program which includes automated exposure control, adjustment of the mA and/or kV according to patient size and/or use of iterative reconstruction technique. CONTRAST:  17m OMNIPAQUE IOHEXOL 350 MG/ML SOLN COMPARISON:  CT abdomen pelvis 03/09/2022 FINDINGS: Lower chest: No acute abnormality.  Coronary artery calcification. Hepatobiliary: No focal liver abnormality. No gallstones, gallbladder wall thickening, or pericholecystic fluid. No biliary dilatation. Pancreas: No focal lesion. Normal pancreatic contour. No surrounding inflammatory changes. No main pancreatic ductal  dilatation. Spleen: Normal in size without focal abnormality.  Splenule noted. Adrenals/Urinary Tract: No adrenal nodule bilaterally. Bilateral kidneys enhance symmetrically. No hydronephrosis. No hydroureter. The urinary bladder is unremarkable. Stomach/Bowel: Stomach is within normal limits. No evidence of bowel wall thickening or dilatation. Appendix appears normal. Vascular/Lymphatic: No abdominal aorta or iliac aneurysm. No abdominal, pelvic, or inguinal lymphadenopathy. Reproductive: Uterus and bilateral adnexa are unremarkable. Other: No intraperitoneal free fluid. No intraperitoneal free gas. No organized fluid collection. Musculoskeletal: Small fat containing umbilical hernia. Mild superficial subcutaneus soft tissue edema of the anterior abdominal wall. No suspicious lytic or blastic osseous lesions. No acute displaced fracture. IMPRESSION: 1. No acute intra-abdominal or intrapelvic abnormality. 2. Small fat containing umbilical hernia. Electronically Signed   By: MIven FinnM.D.   On: 03/15/2022 02:45     PROCEDURES:  Critical Care performed: No     Procedures    IMPRESSION / MDM / ASSESSMENT AND PLAN / ED COURSE  I reviewed the triage vital signs and the nursing notes.    Patient here with 2 months worth of diarrhea.  Having upper abdominal pain.     DIFFERENTIAL DIAGNOSIS (includes but not limited to):   IBS, IBD, colitis, infectious diarrhea, medication side effect (patient is on metformin, Jardiance), dehydration, electrolyte derangement   Patient's presentation is most consistent with acute complicated illness / injury requiring diagnostic workup.   PLAN: Patient's workup initiated from triage.  No leukocytosis.  Normal hemoglobin.  Normal electrolytes, renal function.  Normal LFTs and lipase.  Troponin negative.  EKG nonischemic.  Urinalysis, stool studies and CT of the abdomen pelvis pending.  Repeating stool studies given patient is not able to tell me what all  was checked and I am not able to see these results.   MEDICATIONS GIVEN IN ED: Medications  morphine (PF) 4 MG/ML injection 4 mg (4 mg Intravenous Given 03/15/22 0205)  ondansetron (ZOFRAN) injection 4 mg (4 mg Intravenous Given 03/15/22 0206)  sodium chloride 0.9 % bolus 1,000 mL (0 mLs Intravenous Stopped 03/15/22 0334)  iohexol (OMNIPAQUE) 350 MG/ML injection 125 mL (125 mLs Intravenous Contrast Given 03/15/22 0227)     ED COURSE: CT scan reviewed and interpreted by myself and the radiologist and shows no acute abnormality.  Urine shows no ketonuria.  Patient has been in the ED for over 4 hours and has not had any diarrhea for [place]ato collect a  stool sample.  Discussed with patient that I know that it is inconvenient and disturbing to have diarrhea 6 times a day for the past couple of months but there is nothing emergent present today including no dehydration, AKI, electrolyte derangement, colitis or other acute abnormality that needs antibiotics, hospitalization, IV treatment.  Recommended close follow-up with GI as an outpatient and also further discussion with her PCP for close monitoring.  She is already on Lomotil and Imodium.  Discussed with patient that there is not any other medication that I could prescribe from the ED to help with diarrhea especially given it is not causing any significant dehydration or other acute complication.  Patient seems disappointed which I have explained to her again the role of the emergency department is to rule out emergent findings and that now she needs to follow-up closely with her PCP and gastroenterology as an outpatient.   At this time, I do not feel there is any life-threatening condition present. I reviewed all nursing notes, vitals, pertinent previous records.  All lab and urine results, EKGs, imaging ordered have been independently reviewed and interpreted by myself.  I reviewed all available radiology reports from any imaging ordered this visit.  Based on  my assessment, I feel the patient is safe to be discharged home without further emergent workup and can continue workup as an outpatient as needed. Discussed all findings, treatment plan as well as usual and customary return precautions.  They verbalize understanding and are comfortable with this plan.  Outpatient follow-up has been provided as needed.  All questions have been answered.    CONSULTS:  none   OUTSIDE RECORDS REVIEWED: Reviewed last cardiology note on 12/16/2021.    5:00 AM  Just prior to dc from the ED patient was able to provide a stool sample.  This has been sent to the lab.  I will follow-up on these results which will likely be back in 24 to 48 hours.  6:50 AM  Pt's C. difficile and PCR panel are all negative.   FINAL CLINICAL IMPRESSION(S) / ED DIAGNOSES   Final diagnoses:  Diarrhea, unspecified type     Rx / DC Orders   ED Discharge Orders     None        Note:  This document was prepared using Dragon voice recognition software and may include unintentional dictation errors.      Gesenia Bantz, Delice Bison, DO 03/15/22 [phone]

## 2022-03-15 NOTE — Discharge Instructions (Addendum)
Your lab work, urine showed no acute abnormality and no sign of dehydration.  Your CT scan was also unremarkable today.  I recommend follow-up with gastroenterology as an outpatient for continued diarrhea.

## 2022-03-15 NOTE — ED Notes (Signed)
Report off to [place] rn.

## 2022-03-16 ENCOUNTER — Encounter (HOSPITAL_COMMUNITY): Payer: Self-pay

## 2022-03-16 ENCOUNTER — Encounter (HOSPITAL_COMMUNITY)
Admission: RE | Admit: 2022-03-16 | Discharge: 2022-03-16 | Disposition: A | Discharge status: Home / Self Care | Payer: 59 | Source: Ambulatory Visit | Attending: Psychiatry | Admitting: Psychiatry

## 2022-03-16 ENCOUNTER — Other Ambulatory Visit: Payer: Self-pay

## 2022-03-16 VITALS — BP 101/66 | HR 82 | Temp 97.8°F | Resp 22 | Ht 65.0 in | Wt 330.0 lb

## 2022-03-16 DIAGNOSIS — Z01818 Encounter for other preprocedural examination: Secondary | ICD-10-CM

## 2022-03-16 DIAGNOSIS — I10 Essential (primary) hypertension: Secondary | ICD-10-CM | POA: Insufficient documentation

## 2022-03-16 DIAGNOSIS — E119 Type 2 diabetes mellitus without complications: Secondary | ICD-10-CM

## 2022-03-16 DIAGNOSIS — C541 Malignant neoplasm of endometrium: Secondary | ICD-10-CM | POA: Diagnosis not present

## 2022-03-16 HISTORY — DX: Malignant (primary) neoplasm, unspecified: C80.1

## 2022-03-16 LAB — GLUCOSE, CAPILLARY: Glucose-Capillary: 162 mg/dL — ABNORMAL HIGH (ref 70–99)

## 2022-03-17 ENCOUNTER — Telehealth: Payer: Self-pay

## 2022-03-17 NOTE — Progress Notes (Signed)
Patient's T&S showed positive for antibodies.I placed another order for T&S on DOS.   Leota Jacobsen, BSN, CVRN-BC   Pre-Surgical Testing Nurse [place]  [phone]

## 2022-03-17 NOTE — Telephone Encounter (Signed)
Ms. Hane is scheduled for surgery with Dr.Newton on 1/30. Per Dr. Serafina Royals, pt may stop Eliquis 3 days prior to surgery.   I LVM for patient to call office regarding this recommendation.

## 2022-03-17 NOTE — Telephone Encounter (Signed)
Pt returned call and is aware of Eliquis recommendation from Dr. Nehemiah Massed, of stopping it 3 days prior (1/26) to procedure on 1/30. She states "I do not want to stop it" asking what would happen if she did not stop it, questions and concerns addressed with emphasis on the fact of losing lots of blood, needing a blood transfusion and also procedure could be cancelled. I asked her if she understood reasoning with repeat of instructions and she stated yes. She will call if anymore questions or concerns.   I will call and follow up with her on 1/26.  Joylene John NP, and Dr. Ernestina Patches notified

## 2022-03-18 NOTE — Progress Notes (Signed)
Anesthesia Chart Review   Case: [id number] Date/Time: 03/29/22 1045   Procedures:      XI ROBOTIC ASSISTED TOTAL HYSTERECTOMY WITH BILATERAL SALPINGO OOPHORECTOMY (Bilateral)     SENTINEL NODE BIOPSY     POSSIBLE LYMPH NODE DISSECTION     POSSIBLE DILATATION AND CURETTAGE     POSSIBLE INTRAUTERINE DEVICE (IUD) INSERTION   Anesthesia type: General   Pre-op diagnosis: ENDOMETRIAL CANCER   Location: [facility]OM 05 / [facility]   Surgeons: Bernadene Bell, MD       DISCUSSION:51 y.o. former smoker with h/o HTN, DM II, PAF, mild aortic valve stenosis, endometrial cancer scheduled for above procedure 03/29/2022 with Dr. Bernadene Bell.   Last dose of Eliquis 03/25/22.   Pt last seen by cardiology 12/16/2021, stable at this visit.  Anticipate pt can proceed with planned procedure barring acute status change.   VS: BP 101/66 Comment: right arm sitting  Pulse 82   Temp 36.6 C (Oral)   Resp (!) 22   Ht _0  (1.651 m)   Wt (!) 149.7 kg   LMP 10/06/2017 (Approximate) Comment: Pt did not have period for approx 4 years then started periods again this spring, irregular. neg preg test  SpO2 100%   BMI 54.91 kg/m   PROVIDERS: Center, [facility]  Flossie Dibble, MD is Cardiologist  LABS: Labs reviewed: Acceptable for surgery. (all labs ordered are listed, but only abnormal results are displayed)  Labs Reviewed  GLUCOSE, CAPILLARY - Abnormal; Notable for the following components:      Result Value   Glucose-Capillary 162 (*)    All other components within normal limits  TYPE AND SCREEN     IMAGES:   EKG:   CV: Echo 12/07/2021 INTERPRETATION  NORMAL LEFT VENTRICULAR SYSTOLIC FUNCTION   WITH MILD LVH  NORMAL RIGHT VENTRICULAR SYSTOLIC FUNCTION  TRIVIAL REGURGITATION NOTED (See above)  MILD VALVULAR STENOSIS (See above)  IRREGULAR HEART RHYTHM CAPTURED THROUGHOUT EXAM  ESTIMATED LVEF >55%  Aortic: TRIVIAL AI; MILD AS  AS: MAX VEL. 2.88ms (*prev. exam  3.338m)  AVA: 1.1cm^2 (prev. exam 1.7cm^2)  Mitral: TRIVIAL MR *SIGNIFICANT MAC  Tricuspid: TRIVIAL TR  Pulmonic: TRIVIAL PI  MODERATE LAE   Echo 12/15/2020 1. Left ventricular ejection fraction, by estimation, is 55 to 60%. The  left ventricle has normal function. The left ventricle has no regional  wall motion abnormalities. Left ventricular diastolic parameters were  normal.   2. Right ventricular systolic function is normal. The right ventricular  size is normal.   3. Left atrial size was mildly dilated.   4. Right atrial size was mildly dilated.   5. The mitral valve is normal in structure. Mild mitral valve  regurgitation.   6. The aortic valve is normal in structure. Aortic valve regurgitation is  trivial. Mild aortic valve stenosis.  Past Medical History:  Diagnosis Date   Anemia    Anxiety    Aortic valve stenosis    Asthma    Cancer ([facility]   endometrial cancer   Chronic pulmonary hypertension ([facility]   Depression    Diabetes mellitus without complication (HCC)    GERD (gastroesophageal reflux disease)    Hypercholesteremia    Hypertension    Paroxysmal atrial fibrillation ([facility]   Sleep apnea     Past Surgical History:  Procedure Laterality Date   CARDIOVERSION N/A 12/30/2020   Procedure: CARDIOVERSION;  Surgeon: KoCorey SkainsMD;  Location: [facility];  Service: Cardiovascular;  Laterality: N/A;   CATARACT EXTRACTION Right 11/2021   CATARACT EXTRACTION Left 12/2021   COLONOSCOPY     ESOPHAGOGASTRODUODENOSCOPY (EGD) WITH PROPOFOL N/A 06/29/2021   Procedure: ESOPHAGOGASTRODUODENOSCOPY (EGD) WITH PROPOFOL;  Surgeon: Lucilla Lame, MD;  Location: [facility];  Service: Endoscopy;  Laterality: N/A;   TUBAL LIGATION  2000    MEDICATIONS:  ACCU-CHEK SOFTCLIX LANCETS lancets   acetaminophen (TYLENOL) 500 MG tablet   ADVAIR DISKUS 500-50 MCG/DOSE AEPB   amLODipine (NORVASC) 10 MG tablet   apixaban (ELIQUIS) 5 MG TABS tablet   atorvastatin (LIPITOR) 20 MG  tablet   buPROPion (WELLBUTRIN SR) 100 MG 12 hr tablet   diphenoxylate-atropine (LOMOTIL) 2.5-0.025 MG tablet   enalapril (VASOTEC) 20 MG tablet   ferrous sulfate 325 (65 FE) MG EC tablet   glipiZIDE (GLUCOTROL XL) 10 MG 24 hr tablet   glucose blood test strip   Insulin Glargine (BASAGLAR KWIKPEN) 100 UNIT/ML   JARDIANCE 25 MG TABS tablet   metFORMIN (GLUCOPHAGE-XR) 500 MG 24 hr tablet   omeprazole (PRILOSEC) 40 MG capsule   senna-docusate (SENOKOT-S) 8.6-50 MG tablet   traMADol (ULTRAM) 50 MG tablet   No current facility-administered medications for this encounter.       Konrad Felix Ward, PA-C [facility] [phone]

## 2022-03-24 ENCOUNTER — Encounter: Payer: Self-pay | Admitting: Emergency Medicine

## 2022-03-24 ENCOUNTER — Ambulatory Visit
Admission: EM | Admit: 2022-03-24 | Discharge: 2022-03-24 | Disposition: A | Discharge status: Home / Self Care | Payer: 59 | Attending: Physician Assistant | Admitting: Physician Assistant

## 2022-03-24 DIAGNOSIS — L03211 Cellulitis of face: Secondary | ICD-10-CM

## 2022-03-24 DIAGNOSIS — R519 Headache, unspecified: Secondary | ICD-10-CM | POA: Diagnosis not present

## 2022-03-24 MED ORDER — AMOXICILLIN-POT CLAVULANATE 875-125 MG PO TABS: 1.0000 | ORAL_TABLET | Freq: Two times a day (BID) | ORAL | 0 refills | Status: DC

## 2022-03-24 NOTE — ED Triage Notes (Signed)
Pt has swelling on the left side of her nose and sore, and a headache. Started about 2 days ago.

## 2022-03-24 NOTE — Discharge Instructions (Signed)
-  You have a skin infection. - Does not antibiotics to pharmacy.  Start them right away and take the full course. Take Tylenol for pain. - You should be noticing significant improvement in the next 2 days.  If you do not or feel your symptoms have worsened, return or go to ER.

## 2022-03-24 NOTE — ED Provider Notes (Signed)
[facility]    CSN: [id number] Arrival date & time: 03/24/22  1622      History   Chief Complaint Chief Complaint  Patient presents with   Headache   Abscess    HPI Joan Graves is a 52 y.o. female presenting for redness, swelling and pain of the left side of her nose as well as headaches x 2 days.  She denies any fever, fatigue, body aches, nasal congestion or drainage, sinus pain, sore throat, eye pain or discharge.  Never had anything similar to this.  Has not been treating it in any way.  Her medical history significant for diabetes, hypertension, hyperlipidemia, atrial fibrillation, asthma, anxiety, endometrial cancer, obstructive sleep apnea and obesity.  HPI  Past Medical History:  Diagnosis Date   Anemia    Anxiety    Aortic valve stenosis    Asthma    Cancer ([place])    endometrial cancer   Chronic pulmonary hypertension ([place])    Depression    Diabetes mellitus without complication (HCC)    GERD (gastroesophageal reflux disease)    Hypercholesteremia    Hypertension    Paroxysmal atrial fibrillation ([place])    Sleep apnea     Patient Active Problem List   Diagnosis Date Noted   Family history of ovarian cancer 02/03/2022   PMB (postmenopausal bleeding) 02/03/2022   Esophageal dysphagia    Problems with swallowing and mastication    Paroxysmal atrial fibrillation ([place]) 12/09/2020   Chronic pulmonary hypertension ([place]) 06/11/2020   Moderate aortic valve stenosis 06/11/2020   Benign essential hypertension 12/02/2014   Hyperlipidemia, mixed 12/11/2013   Obstructive sleep apnea 12/11/2013   Asthma 12/09/2013   Diabetes ([place]) 12/09/2013   Gastroesophageal reflux disease 12/09/2013    Past Surgical History:  Procedure Laterality Date   CARDIOVERSION N/A 12/30/2020   Procedure: CARDIOVERSION;  Surgeon: Corey Skains, MD;  Location: [facility];  Service: Cardiovascular;  Laterality: N/A;   CATARACT EXTRACTION Right 11/2021   CATARACT  EXTRACTION Left 12/2021   COLONOSCOPY     ESOPHAGOGASTRODUODENOSCOPY (EGD) WITH PROPOFOL N/A 06/29/2021   Procedure: ESOPHAGOGASTRODUODENOSCOPY (EGD) WITH PROPOFOL;  Surgeon: Lucilla Lame, MD;  Location: [facility];  Service: Endoscopy;  Laterality: N/A;   TUBAL LIGATION  2000    OB History     Gravida  1   Para  1   Term  1   Preterm      AB      Living  1      SAB      IAB      Ectopic      Multiple      Live Births  1            Home Medications    Prior to Admission medications   Medication Sig Start Date End Date Taking? Authorizing Provider  ADVAIR DISKUS 500-50 MCG/DOSE AEPB Inhale 1 puff into the lungs in the morning and at bedtime. 04/24/17  Yes [provider]  amLODipine (NORVASC) 10 MG tablet Take 1 tablet by mouth daily. 10/17/18  Yes [provider]  amoxicillin-clavulanate (AUGMENTIN) 875-125 MG tablet Take 1 tablet by mouth every 12 (twelve) hours for 7 days. 03/24/22 03/31/22 Yes Danton Clap, PA-C  apixaban (ELIQUIS) 5 MG TABS tablet Take 5 mg by mouth 2 (two) times daily.   Yes [provider]  atorvastatin (LIPITOR) 20 MG tablet Take 20 mg by mouth daily. 10/04/19  Yes [provider]  buPROPion (WELLBUTRIN SR) 100 MG 12 hr tablet Take 100 mg by mouth 2 (two) times daily. 08/10/20  Yes [provider]  enalapril (VASOTEC) 20 MG tablet Take 20 mg by mouth daily. 02/13/20  Yes [provider]  ferrous sulfate 325 (65 FE) MG EC tablet Take 650 mg by mouth daily with breakfast. 11/30/15  Yes [provider]  glipiZIDE (GLUCOTROL XL) 10 MG 24 hr tablet Take 10 mg by mouth daily with breakfast. 08/14/15  Yes [provider]  Insulin Glargine (BASAGLAR KWIKPEN) 100 UNIT/ML Inject 65 Units into the skin at bedtime as needed (if CBG over 200). 07/16/20  Yes [provider]  JARDIANCE 25 MG TABS tablet Take 25 mg by mouth daily. 07/16/20  Yes [provider]  metFORMIN  (GLUCOPHAGE-XR) 500 MG 24 hr tablet Take 1,000 mg by mouth 2 (two) times daily with a meal. 05/26/17  Yes [provider]  omeprazole (PRILOSEC) 40 MG capsule Take 40 mg by mouth daily. 12/15/21  Yes [provider]  ACCU-CHEK SOFTCLIX LANCETS lancets CHECK GLUCOSE TWICE DIALY FOR GLUCOSE MONITORING E11.9 06/09/17   [provider]  acetaminophen (TYLENOL) 500 MG tablet Take 1,000 mg by mouth every 6 (six) hours as needed for mild pain.    [provider]  diphenoxylate-atropine (LOMOTIL) 2.5-0.025 MG tablet Take 1 tablet by mouth 4 (four) times daily. 03/15/22   [provider]  glucose blood test strip USE AS DIRECTED TWICE A DAY 07/08/15   [provider]  senna-docusate (SENOKOT-S) 8.6-50 MG tablet Take 2 tablets by mouth at bedtime. For AFTER surgery, do not take if having diarrhea 03/02/22   Joylene John D, NP  traMADol (ULTRAM) 50 MG tablet Take 1 tablet (50 mg total) by mouth every 6 (six) hours as needed for severe pain. For AFTER surgery only, do not take and drive 04/05/92   Dorothyann Gibbs, NP    Family History Family History  Problem Relation Age of Onset   Ovarian cancer Mother        mid 70s   Lung cancer Mother 65   Throat cancer Maternal Grandmother    Colon cancer Neg Hx    Breast cancer Neg Hx    Endometrial cancer Neg Hx    Pancreatic cancer Neg Hx    Prostate cancer Neg Hx     Social History Social History   Tobacco Use   Smoking status: Former    Types: Cigarettes    Passive exposure: Past   Smokeless tobacco: Never  Vaping Use   Vaping Use: Every day   Substances: Nicotine, Flavoring  Substance Use Topics   Alcohol use: Not Currently    Comment: occ   Drug use: Never     Allergies   Aspirin   Review of Systems Review of Systems  Constitutional:  Negative for fatigue and fever.  HENT:  Positive for facial swelling. Negative for congestion, ear pain, sinus pain and sore throat.   Eyes:  Negative  for photophobia, pain, discharge and redness.  Skin:  Negative for rash.  Neurological:  Positive for headaches. Negative for dizziness and weakness.  Hematological:  Negative for adenopathy.     Physical Exam Triage Vital Signs ED Triage Vitals  Enc Vitals Group     BP 03/24/22 1705 130/77     Pulse Rate 03/24/22 1705 90     Resp 03/24/22 1705 16     Temp 03/24/22 1705 98.4 F (36.9 C)  Temp Source 03/24/22 1705 Oral     SpO2 03/24/22 1705 99 %     Weight 03/24/22 1704 (!) 330 lb 0.5 oz (149.7 kg)     Height 03/24/22 1704 '5\' 5"'$  (1.651 m)     Head Circumference --      Peak Flow --      Pain Score 03/24/22 1702 10     Pain Loc --      Pain Edu? --      Excl. in [place]? --    No data found.  Updated Vital Signs BP 130/77 (BP Location: Left Arm)   Pulse 90   Temp 98.4 F (36.9 C) (Oral)   Resp 16   Ht '5\' 5"'$  (1.651 m)   Wt (!) 330 lb 0.5 oz (149.7 kg)   LMP 10/06/2017 (Approximate) Comment: Pt did not have period for approx 4 years then started periods again this spring, irregular. neg preg test  SpO2 99%   BMI 54.92 kg/m   Physical Exam Vitals and nursing note reviewed.  Constitutional:      General: She is not in acute distress.    Appearance: Normal appearance. She is not ill-appearing or toxic-appearing.  HENT:     Head: Normocephalic and atraumatic.     Nose: Nose normal.     Mouth/Throat:     Mouth: Mucous membranes are moist.     Pharynx: Oropharynx is clear.  Eyes:     General: No scleral icterus.       Right eye: No discharge.        Left eye: No discharge.     Conjunctiva/sclera: Conjunctivae normal.  Cardiovascular:     Rate and Rhythm: Normal rate and regular rhythm.     Heart sounds: Normal heart sounds.  Pulmonary:     Effort: Pulmonary effort is normal. No respiratory distress.     Breath sounds: Normal breath sounds.  Musculoskeletal:     Cervical back: Neck supple.  Skin:    General: Skin is dry.     Comments: See images below. There is  increased erythema and swelling of the left aspect of patient's nose. No fluctuance. No discharge from nose. No vesicles, laceration, abrasions.  Neurological:     General: No focal deficit present.     Mental Status: She is alert. Mental status is at baseline.     Motor: No weakness.     Gait: Gait normal.  Psychiatric:        Mood and Affect: Mood normal.        Behavior: Behavior normal.        Thought Content: Thought content normal.         UC Treatments / Results  Labs (all labs ordered are listed, but only abnormal results are displayed) Labs Reviewed - No data to display  EKG   Radiology No results found.  Procedures Procedures (including critical care time)  Medications Ordered in UC Medications - No data to display  Initial Impression / Assessment and Plan / UC Course  I have reviewed the triage vital signs and the nursing notes.  Pertinent labs & imaging results that were available during my care of the patient were reviewed by me and considered in my medical decision making (see chart for details).   52 year old female with significant comorbidities presents for redness, swelling and pain of the left side of her nose and headaches x 2 days.  She denies any worsening or improvement in her symptoms over  the past couple days.  No associated fever and she has not had any congestion.  She denies any vesicles, papules, lacerations, abrasions.  No injury to her nose.  No similar symptoms in the past.  No report of any history of MRSA.  Patient is afebrile.  I included images in the chart of patient's nose.  She does have increased erythema, swelling and tenderness of the left side of her nose.  There is no drainage.  The swelling extends mildly just under patient's left eye.  No drainage from her eye or eye pain.  The nose is tender to palpation.  She does not have any nasal drainage.  Suspect facial cellulitis.  Will treat at this time with Augmentin.  Also vies cool  compresses, warm compresses, Tylenol for pain, very close monitoring.  Reviewed return or ED if no improvement in 2 days or if symptoms worsen or she develops a fever.   Final Clinical Impressions(s) / UC Diagnoses   Final diagnoses:  Facial cellulitis  Acute nonintractable headache, unspecified headache type     Discharge Instructions      -You have a skin infection. - Does not antibiotics to pharmacy.  Start them right away and take the full course. Take Tylenol for pain. - You should be noticing significant improvement in the next 2 days.  If you do not or feel your symptoms have worsened, return or go to ER.     ED Prescriptions     Medication Sig Dispense Auth. Provider   amoxicillin-clavulanate (AUGMENTIN) 875-125 MG tablet Take 1 tablet by mouth every 12 (twelve) hours for 7 days. 14 tablet Gretta Cool      PDMP not reviewed this encounter.   Danton Clap, PA-C 03/24/22 1750

## 2022-03-25 NOTE — Telephone Encounter (Signed)
I spoke to Joan Graves in depth today regarding medication management of Eliquis. She is aware, with verbal repeat by patient, the importance of stopping the Eliquis with 1/27 being the last day to take it. I also reviewed diabetic medication management for the day before surgery 1/29. She again repeated those instructions back to me. She will call if she has any questions or concerns.

## 2022-03-26 ENCOUNTER — Inpatient Hospital Stay
Admission: EM | Admit: 2022-03-26 | Discharge: 2022-03-28 | DRG: 155 | Disposition: A | Discharge status: Home / Self Care | Payer: 59 | Attending: Internal Medicine | Admitting: Internal Medicine

## 2022-03-26 ENCOUNTER — Other Ambulatory Visit: Payer: Self-pay

## 2022-03-26 ENCOUNTER — Emergency Department: Payer: 59

## 2022-03-26 DIAGNOSIS — C541 Malignant neoplasm of endometrium: Secondary | ICD-10-CM | POA: Diagnosis not present

## 2022-03-26 DIAGNOSIS — E782 Mixed hyperlipidemia: Secondary | ICD-10-CM | POA: Diagnosis present

## 2022-03-26 DIAGNOSIS — F32A Depression, unspecified: Secondary | ICD-10-CM | POA: Diagnosis present

## 2022-03-26 DIAGNOSIS — I350 Nonrheumatic aortic (valve) stenosis: Secondary | ICD-10-CM | POA: Diagnosis present

## 2022-03-26 DIAGNOSIS — E119 Type 2 diabetes mellitus without complications: Secondary | ICD-10-CM | POA: Diagnosis present

## 2022-03-26 DIAGNOSIS — K219 Gastro-esophageal reflux disease without esophagitis: Secondary | ICD-10-CM | POA: Diagnosis present

## 2022-03-26 DIAGNOSIS — L03211 Cellulitis of face: Secondary | ICD-10-CM | POA: Diagnosis present

## 2022-03-26 DIAGNOSIS — J340 Abscess, furuncle and carbuncle of nose: Secondary | ICD-10-CM | POA: Diagnosis not present

## 2022-03-26 DIAGNOSIS — F419 Anxiety disorder, unspecified: Secondary | ICD-10-CM | POA: Diagnosis present

## 2022-03-26 DIAGNOSIS — Z87891 Personal history of nicotine dependence: Secondary | ICD-10-CM | POA: Diagnosis not present

## 2022-03-26 DIAGNOSIS — Z801 Family history of malignant neoplasm of trachea, bronchus and lung: Secondary | ICD-10-CM | POA: Diagnosis not present

## 2022-03-26 DIAGNOSIS — J45909 Unspecified asthma, uncomplicated: Secondary | ICD-10-CM | POA: Diagnosis present

## 2022-03-26 DIAGNOSIS — Z808 Family history of malignant neoplasm of other organs or systems: Secondary | ICD-10-CM

## 2022-03-26 DIAGNOSIS — I272 Pulmonary hypertension, unspecified: Secondary | ICD-10-CM | POA: Diagnosis present

## 2022-03-26 DIAGNOSIS — I480 Paroxysmal atrial fibrillation: Secondary | ICD-10-CM | POA: Diagnosis present

## 2022-03-26 DIAGNOSIS — Z7901 Long term (current) use of anticoagulants: Secondary | ICD-10-CM | POA: Diagnosis not present

## 2022-03-26 DIAGNOSIS — Z6841 Body Mass Index (BMI) 40.0 and over, adult: Secondary | ICD-10-CM

## 2022-03-26 DIAGNOSIS — B9562 Methicillin resistant Staphylococcus aureus infection as the cause of diseases classified elsewhere: Secondary | ICD-10-CM | POA: Diagnosis present

## 2022-03-26 DIAGNOSIS — J3489 Other specified disorders of nose and nasal sinuses: Secondary | ICD-10-CM | POA: Diagnosis present

## 2022-03-26 DIAGNOSIS — Z79899 Other long term (current) drug therapy: Secondary | ICD-10-CM

## 2022-03-26 DIAGNOSIS — I10 Essential (primary) hypertension: Secondary | ICD-10-CM | POA: Diagnosis not present

## 2022-03-26 DIAGNOSIS — Z8041 Family history of malignant neoplasm of ovary: Secondary | ICD-10-CM | POA: Diagnosis not present

## 2022-03-26 DIAGNOSIS — Z886 Allergy status to analgesic agent status: Secondary | ICD-10-CM

## 2022-03-26 DIAGNOSIS — Z794 Long term (current) use of insulin: Secondary | ICD-10-CM | POA: Diagnosis not present

## 2022-03-26 DIAGNOSIS — G4733 Obstructive sleep apnea (adult) (pediatric): Secondary | ICD-10-CM | POA: Diagnosis present

## 2022-03-26 DIAGNOSIS — E7800 Pure hypercholesterolemia, unspecified: Secondary | ICD-10-CM | POA: Diagnosis present

## 2022-03-26 DIAGNOSIS — F418 Other specified anxiety disorders: Secondary | ICD-10-CM | POA: Diagnosis not present

## 2022-03-26 DIAGNOSIS — Z7984 Long term (current) use of oral hypoglycemic drugs: Secondary | ICD-10-CM

## 2022-03-26 DIAGNOSIS — G4730 Sleep apnea, unspecified: Secondary | ICD-10-CM | POA: Diagnosis present

## 2022-03-26 DIAGNOSIS — E785 Hyperlipidemia, unspecified: Secondary | ICD-10-CM | POA: Diagnosis present

## 2022-03-26 LAB — CBC WITH DIFFERENTIAL/PLATELET
Abs Immature Granulocytes: 0.03 10*3/uL (ref 0.00–0.07)
Basophils Absolute: 0 10*3/uL (ref 0.0–0.1)
Basophils Relative: 0 %
Eosinophils Absolute: 0 10*3/uL (ref 0.0–0.5)
Eosinophils Relative: 0 %
HCT: 44.8 % (ref 36.0–46.0)
Hemoglobin: 13.8 g/dL (ref 12.0–15.0)
Immature Granulocytes: 0 %
Lymphocytes Relative: 17 %
Lymphs Abs: 1.6 10*3/uL (ref 0.7–4.0)
MCH: 26.7 pg (ref 26.0–34.0)
MCHC: 30.8 g/dL (ref 30.0–36.0)
MCV: 86.8 fL (ref 80.0–100.0)
Monocytes Absolute: 0.7 10*3/uL (ref 0.1–1.0)
Monocytes Relative: 7 %
Neutro Abs: 7.4 10*3/uL (ref 1.7–7.7)
Neutrophils Relative %: 76 %
Platelets: 228 10*3/uL (ref 150–400)
RBC: 5.16 MIL/uL — ABNORMAL HIGH (ref 3.87–5.11)
RDW: 14.1 % (ref 11.5–15.5)
WBC: 9.8 10*3/uL (ref 4.0–10.5)
nRBC: 0 % (ref 0.0–0.2)

## 2022-03-26 LAB — BASIC METABOLIC PANEL
Anion gap: 11 (ref 5–15)
BUN: 14 mg/dL (ref 6–20)
CO2: 24 mmol/L (ref 22–32)
Calcium: 9 mg/dL (ref 8.9–10.3)
Chloride: 104 mmol/L (ref 98–111)
Creatinine, Ser: 0.61 mg/dL (ref 0.44–1.00)
GFR, Estimated: >60 mL/min (ref >60)
Glucose, Bld: 130 mg/dL — ABNORMAL HIGH (ref 70–99)
Potassium: 3.9 mmol/L (ref 3.5–5.1)
Sodium: 139 mmol/L (ref 135–145)

## 2022-03-26 LAB — GLUCOSE, CAPILLARY
Glucose-Capillary: 256 mg/dL — ABNORMAL HIGH (ref 70–99)
Glucose-Capillary: 228 mg/dL — ABNORMAL HIGH (ref 70–99)

## 2022-03-26 LAB — SEDIMENTATION RATE: Sed Rate: 34 mm/hr — ABNORMAL HIGH (ref 0–30)

## 2022-03-26 MED ORDER — SODIUM CHLORIDE 0.9 % IV BOLUS
1000.0000 mL | Freq: Once | INTRAVENOUS | Status: AC
  Administered 2022-03-26: 1000 mL via INTRAVENOUS

## 2022-03-26 MED ORDER — INSULIN ASPART 100 UNIT/ML IJ SOLN
0.0000 [IU] | Freq: Three times a day (TID) | INTRAMUSCULAR | Status: DC
  Administered 2022-03-26: 5 [IU] via SUBCUTANEOUS
  Administered 2022-03-27 – 2022-03-28 (×4): 2 [IU] via SUBCUTANEOUS
  Filled 2022-03-26 (×5): qty 1

## 2022-03-26 MED ORDER — ATORVASTATIN CALCIUM 20 MG PO TABS
20.0000 mg | ORAL_TABLET | Freq: Every day | ORAL | Status: DC
  Administered 2022-03-27 – 2022-03-28 (×2): 20 mg via ORAL
  Filled 2022-03-26 (×2): qty 1

## 2022-03-26 MED ORDER — SENNOSIDES-DOCUSATE SODIUM 8.6-50 MG PO TABS: 2.0000 | ORAL_TABLET | Freq: Every evening | ORAL | Status: DC | PRN

## 2022-03-26 MED ORDER — ENALAPRIL MALEATE 10 MG PO TABS
20.0000 mg | ORAL_TABLET | Freq: Every day | ORAL | Status: DC
  Administered 2022-03-28: 20 mg via ORAL
  Filled 2022-03-26: qty 2
  Filled 2022-03-26 (×2): qty 1

## 2022-03-26 MED ORDER — PANTOPRAZOLE SODIUM 40 MG PO TBEC
40.0000 mg | DELAYED_RELEASE_TABLET | Freq: Every day | ORAL | Status: DC
  Administered 2022-03-26 – 2022-03-28 (×3): 40 mg via ORAL
  Filled 2022-03-26 (×3): qty 1

## 2022-03-26 MED ORDER — INSULIN ASPART 100 UNIT/ML IJ SOLN
0.0000 [IU] | Freq: Every day | INTRAMUSCULAR | Status: DC
  Administered 2022-03-26: 2 [IU] via SUBCUTANEOUS
  Filled 2022-03-26: qty 1

## 2022-03-26 MED ORDER — ONDANSETRON HCL 4 MG/2ML IJ SOLN: 4.0000 mg | Freq: Three times a day (TID) | INTRAMUSCULAR | Status: DC | PRN

## 2022-03-26 MED ORDER — BUPROPION HCL ER (SR) 100 MG PO TB12
100.0000 mg | ORAL_TABLET | Freq: Two times a day (BID) | ORAL | Status: DC
  Administered 2022-03-26 – 2022-03-28 (×4): 100 mg via ORAL
  Filled 2022-03-26 (×5): qty 1

## 2022-03-26 MED ORDER — HEPARIN SODIUM (PORCINE) 5000 UNIT/ML IJ SOLN
5000.0000 [IU] | Freq: Three times a day (TID) | INTRAMUSCULAR | Status: DC
  Administered 2022-03-26 – 2022-03-28 (×5): 5000 [IU] via SUBCUTANEOUS
  Filled 2022-03-26 (×6): qty 1

## 2022-03-26 MED ORDER — IOHEXOL 300 MG/ML  SOLN
100.0000 mL | Freq: Once | INTRAMUSCULAR | Status: AC | PRN
  Administered 2022-03-26: 100 mL via INTRAVENOUS

## 2022-03-26 MED ORDER — OXYCODONE-ACETAMINOPHEN 5-325 MG PO TABS
1.0000 | ORAL_TABLET | ORAL | Status: DC | PRN
  Administered 2022-03-26 (×2): 1 via ORAL
  Filled 2022-03-26 (×2): qty 1

## 2022-03-26 MED ORDER — HYDRALAZINE HCL 20 MG/ML IJ SOLN: 5.0000 mg | INTRAMUSCULAR | Status: DC | PRN

## 2022-03-26 MED ORDER — SODIUM CHLORIDE 0.9 % IV SOLN
3.0000 g | Freq: Once | INTRAVENOUS | Status: AC
  Administered 2022-03-26: 3 g via INTRAVENOUS
  Filled 2022-03-26: qty 8

## 2022-03-26 MED ORDER — AMLODIPINE BESYLATE 10 MG PO TABS
10.0000 mg | ORAL_TABLET | Freq: Every day | ORAL | Status: DC
  Administered 2022-03-27 – 2022-03-28 (×2): 10 mg via ORAL
  Filled 2022-03-26 (×2): qty 1

## 2022-03-26 MED ORDER — ACETAMINOPHEN 325 MG PO TABS
650.0000 mg | ORAL_TABLET | Freq: Four times a day (QID) | ORAL | Status: DC | PRN
  Administered 2022-03-27: 650 mg via ORAL
  Filled 2022-03-26: qty 2

## 2022-03-26 MED ORDER — OXYMETAZOLINE HCL 0.05 % NA SOLN
1.0000 | Freq: Two times a day (BID) | NASAL | Status: DC
  Administered 2022-03-26 – 2022-03-28 (×5): 1 via NASAL
  Filled 2022-03-26: qty 30
  Filled 2022-03-26: qty 15

## 2022-03-26 MED ORDER — ALBUTEROL SULFATE (2.5 MG/3ML) 0.083% IN NEBU: 2.5000 mg | INHALATION_SOLUTION | RESPIRATORY_TRACT | Status: DC | PRN

## 2022-03-26 MED ORDER — MORPHINE SULFATE (PF) 2 MG/ML IV SOLN: 2.0000 mg | INTRAVENOUS | Status: DC | PRN

## 2022-03-26 MED ORDER — METOPROLOL TARTRATE 25 MG PO TABS
12.5000 mg | ORAL_TABLET | Freq: Three times a day (TID) | ORAL | Status: DC
  Administered 2022-03-26 – 2022-03-28 (×6): 12.5 mg via ORAL
  Filled 2022-03-26 (×6): qty 1

## 2022-03-26 MED ORDER — DM-GUAIFENESIN ER 30-600 MG PO TB12: 1.0000 | ORAL_TABLET | Freq: Two times a day (BID) | ORAL | Status: DC | PRN

## 2022-03-26 MED ORDER — METOPROLOL TARTRATE 5 MG/5ML IV SOLN: 5.0000 mg | INTRAVENOUS | Status: DC | PRN

## 2022-03-26 MED ORDER — SODIUM CHLORIDE 0.9 % IV SOLN
3.0000 g | Freq: Four times a day (QID) | INTRAVENOUS | Status: DC
  Administered 2022-03-26 – 2022-03-28 (×7): 3 g via INTRAVENOUS
  Filled 2022-03-26: qty 3
  Filled 2022-03-26 (×6): qty 8
  Filled 2022-03-26: qty 3
  Filled 2022-03-26 (×2): qty 8

## 2022-03-26 NOTE — ED Triage Notes (Signed)
Patient presents with redness, swelling and pain to her nose; She was seen at [facility] and started on Augmentin for suspected cellulitis; Since then, her symptoms have worsened and the pain now radiates down to her jaw; She has a patent airway and does not appear to be in any distress at this time

## 2022-03-26 NOTE — H&P (Signed)
History and Physical    Joan Graves KYH:[id number] DOB: 1970/09/17 DOA: 03/26/2022  Referring MD/NP/PA:   PCP: [facility] [place]   Patient coming from:  The patient is coming from home.          Chief Complaint: nose pain  HPI: Joan Graves is a 52 y.o. female with medical history significant of atrial fibrillation on Eliquis, pulmonary hypertension, hypertension, hyperlipidemia, diabetes mellitus, GERD, depression with anxiety, aortic stenosis, endometrial cancer (scheduled for surgery on 03/29/2022), who presents with nose pain.  Patient states that she has nose pain for 3 days. Her nose is erythematous and painful, which is constant, sharp, severe, nonradiating.  Patient was seen in urgent care 2 days ago and was given prescription of Augmentin without help.  The erythema of her nose has been spreading to have involved left side of upper face, below left eye.  States that she has a little drainage from left nasal nare.  No fever or chills.  Patient does not have chest pain, cough, shortness breath.  No nausea, vomiting, diarrhea or abdominal pain.  No symptoms of UTI.  Of note, patient is scheduled for endometrial cancer surgery on 03/29/2022.  She was instructed to have stopped taking Eliquis today.  Data reviewed independently and ED Course: pt was found to have WBC 9.8, GFR> 60, temperature 99, blood pressure 134/87, heart rate 111, RR 19, oxygen saturation 99% on room air.  Patient is admitted to telemetry bed as inpatient.  Dr. Tacey Ruiz of ENT was consulted by EDP.  CT-maxillofacial image 1. Asymmetric soft tissue swelling and enhancement over the left side of the nose compatible with cellulitis. 2. An area of hypoattenuation anteriorly just above the naris may represent a developing abscess measuring 10 x 4 x 9 mm. 3. No significant extension into the orbit or face  EKG:  Not done in ED, will get one.    Review of Systems:   General: no  fevers, chills, no body weight gain, fatigue HEENT: no blurry vision, hearing changes or sore throat. Has nose pain Respiratory: no dyspnea, coughing, wheezing CV: no chest pain, no palpitations GI: no nausea, vomiting, abdominal pain, diarrhea, constipation GU: no dysuria, burning on urination, increased urinary frequency, hematuria  Ext: no leg edema Neuro: no unilateral weakness, numbness, or tingling, no vision change or hearing loss Skin: no rash, no skin tear. MSK: No muscle spasm, no deformity, no limitation of range of movement in spin Heme: No easy bruising.  Travel history: No recent long distant travel.   Allergy:  Allergies  Allergen Reactions   Aspirin Diarrhea    "high doses" gives her diarrhea    Past Medical History:  Diagnosis Date   Anemia    Anxiety    Aortic valve stenosis    Asthma    Cancer ([place])    endometrial cancer   Chronic pulmonary hypertension ([place])    Depression    Diabetes mellitus without complication (HCC)    GERD (gastroesophageal reflux disease)    Hypercholesteremia    Hypertension    Paroxysmal atrial fibrillation ([place])    Sleep apnea     Past Surgical History:  Procedure Laterality Date   CARDIOVERSION N/A 12/30/2020   Procedure: CARDIOVERSION;  Surgeon: Corey Skains, MD;  Location: [facility] ORS;  Service: Cardiovascular;  Laterality: N/A;   CATARACT EXTRACTION Right 11/2021   CATARACT EXTRACTION Left 12/2021   COLONOSCOPY     ESOPHAGOGASTRODUODENOSCOPY (EGD) WITH PROPOFOL N/A 06/29/2021  Procedure: ESOPHAGOGASTRODUODENOSCOPY (EGD) WITH PROPOFOL;  Surgeon: Lucilla Lame, MD;  [facility] END[facility];  Service: Endoscopy;  Laterality: N/A;   TUBAL LIGATION  2000    Social History:  reports that she has quit smoking. Her smoking use included cigarettes. She has been exposed to tobacco smoke. She has never used smokeless tobacco. She reports that she does not currently use alcohol. She reports that she does not use  drugs.  Family History:  Family History  Problem Relation Age of Onset   Ovarian cancer Mother        mid 41s   Lung cancer Mother 13   Throat cancer Maternal Grandmother    Colon cancer Neg Hx    Breast cancer Neg Hx    Endometrial cancer Neg Hx    Pancreatic cancer Neg Hx    Prostate cancer Neg Hx      Prior to Admission medications   Medication Sig Start Date End Date Taking? Authorizing Provider  ACCU-CHEK SOFTCLIX LANCETS lancets CHECK GLUCOSE TWICE DIALY FOR GLUCOSE MONITORING E11.9 06/09/17   [provider]  acetaminophen (TYLENOL) 500 MG tablet Take 1,000 mg by mouth every 6 (six) hours as needed for mild pain.    [provider]  ADVAIR DISKUS 500-50 MCG/DOSE AEPB Inhale 1 puff into the lungs in the morning and at bedtime. 04/24/17   [provider]  amLODipine (NORVASC) 10 MG tablet Take 1 tablet by mouth daily. 10/17/18   [provider]  amoxicillin-clavulanate (AUGMENTIN) 875-125 MG tablet Take 1 tablet by mouth every 12 (twelve) hours for 7 days. 03/24/22 03/31/22  Danton Clap, PA-C  apixaban (ELIQUIS) 5 MG TABS tablet Take 5 mg by mouth 2 (two) times daily.    [provider]  atorvastatin (LIPITOR) 20 MG tablet Take 20 mg by mouth daily. 10/04/19   [provider]  buPROPion (WELLBUTRIN SR) 100 MG 12 hr tablet Take 100 mg by mouth 2 (two) times daily. 08/10/20   [provider]  diphenoxylate-atropine (LOMOTIL) 2.5-0.025 MG tablet Take 1 tablet by mouth 4 (four) times daily. 03/15/22   [provider]  enalapril (VASOTEC) 20 MG tablet Take 20 mg by mouth daily. 02/13/20   [provider]  ferrous sulfate 325 (65 FE) MG EC tablet Take 650 mg by mouth daily with breakfast. 11/30/15   [provider]  glipiZIDE (GLUCOTROL XL) 10 MG 24 hr tablet Take 10 mg by mouth daily with breakfast. 08/14/15   [provider]  glucose blood test strip USE AS DIRECTED TWICE A DAY 07/08/15    [provider]  Insulin Glargine (BASAGLAR KWIKPEN) 100 UNIT/ML Inject 65 Units into the skin at bedtime as needed (if CBG over 200). 07/16/20   [provider]  JARDIANCE 25 MG TABS tablet Take 25 mg by mouth daily. 07/16/20   [provider]  metFORMIN (GLUCOPHAGE-XR) 500 MG 24 hr tablet Take 1,000 mg by mouth 2 (two) times daily with a meal. 05/26/17   [provider]  omeprazole (PRILOSEC) 40 MG capsule Take 40 mg by mouth daily. 12/15/21   [provider]  senna-docusate (SENOKOT-S) 8.6-50 MG tablet Take 2 tablets by mouth at bedtime. For AFTER surgery, do not take if having diarrhea 03/02/22   Joylene John D, NP  traMADol (ULTRAM) 50 MG tablet Take 1 tablet (50 mg total) by mouth every 6 (six) hours as needed for severe pain. For AFTER surgery only, do not take and drive 10/31/88   Cross,  Carollee Massed, NP    Physical Exam: Vitals:   03/26/22 1133 03/26/22 1135  BP:  134/87  Pulse:  (!) 111  Resp:  19  Temp:  98 F (36.7 C)  SpO2:  99%  Weight: (!) 149 kg   Height: '5\' 5"'$  (1.651 m)    General: Not in acute distress HEENT: has swelling and erythema over her lower nasal bridge, extending leftward to her left upper face, beneath the left eye, no eye involvement. There is little purulence within the superior portion of the left nare         Eyes: PERRL, EOMI, no scleral icterus.       ENT: No discharge from the ears, no pharynx injection, no tonsillar enlargement.        Neck: No JVD, no bruit, no mass felt. Heme: No neck lymph node enlargement. Cardiac: S1/S2, RRR, No murmurs, No gallops or rubs. Respiratory: No rales, wheezing, rhonchi or rubs. GI: Soft, nondistended, nontender, no rebound pain, no organomegaly, BS present. GU: No hematuria Ext: No pitting leg edema bilaterally. 1+DP/PT pulse bilaterally. Musculoskeletal: No joint deformities, No joint redness or warmth, no limitation of ROM in spin. Skin: No rashes.  Neuro: Alert, oriented  X3, cranial nerves II-XII grossly intact, moves all extremities normally.  Psych: Patient is not psychotic, no suicidal or hemocidal ideation.  Labs on Admission: I have personally reviewed following labs and imaging studies  CBC: Recent Labs  Lab 03/26/22 1207  WBC 9.8  NEUTROABS 7.4  HGB 13.8  HCT 44.8  MCV 86.8  PLT 967   Basic Metabolic Panel: Recent Labs  Lab 03/26/22 1207  NA 139  K 3.9  CL 104  CO2 24  GLUCOSE 130*  BUN 14  CREATININE 0.61  CALCIUM 9.0   GFR: Estimated Creatinine Clearance: 124.6 mL/min (by C-G formula based on SCr of 0.61 mg/dL). Liver Function Tests: No results for input(s): "AST", "ALT", "ALKPHOS", "BILITOT", "PROT", "ALBUMIN" in the last 168 hours. No results for input(s): "LIPASE", "AMYLASE" in the last 168 hours. No results for input(s): "AMMONIA" in the last 168 hours. Coagulation Profile: No results for input(s): "INR", "PROTIME" in the last 168 hours. Cardiac Enzymes: No results for input(s): "CKTOTAL", "CKMB", "CKMBINDEX", "TROPONINI" in the last 168 hours. BNP (last 3 results) No results for input(s): "PROBNP" in the last 8760 hours. HbA1C: No results for input(s): "HGBA1C" in the last 72 hours. CBG: No results for input(s): "GLUCAP" in the last 168 hours. Lipid Profile: No results for input(s): "CHOL", "HDL", "LDLCALC", "TRIG", "CHOLHDL", "LDLDIRECT" in the last 72 hours. Thyroid Function Tests: No results for input(s): "TSH", "T4TOTAL", "FREET4", "T3FREE", "THYROIDAB" in the last 72 hours. Anemia Panel: No results for input(s): "VITAMINB12", "FOLATE", "FERRITIN", "TIBC", "IRON", "RETICCTPCT" in the last 72 hours. Urine analysis:    Component Value Date/Time   COLORURINE STRAW (A) 03/15/2022 0230   APPEARANCEUR CLEAR (A) 03/15/2022 0230   APPEARANCEUR Clear 11/15/2021 1450   LABSPEC 1.023 03/15/2022 0230   PHURINE 5.0 03/15/2022 0230   GLUCOSEU >=500 (A) 03/15/2022 0230   HGBUR MODERATE (A) 03/15/2022 0230   BILIRUBINUR  NEGATIVE 03/15/2022 0230   BILIRUBINUR Negative 11/15/2021 1450   KETONESUR NEGATIVE 03/15/2022 0230   PROTEINUR NEGATIVE 03/15/2022 0230   NITRITE NEGATIVE 03/15/2022 0230   LEUKOCYTESUR TRACE (A) 03/15/2022 0230   Sepsis Labs: '@LABRCNTIP'$ (procalcitonin:4,lacticidven:4) )No results found for this or any previous visit (from the past 240 hour(s)).   Radiological Exams on Admission: CT MAXILLOFACIAL W CONTRAST  Result Date:  03/26/2022 CLINICAL DATA:  Nasal abscess EXAM: CT MAXILLOFACIAL WITH CONTRAST TECHNIQUE: Multidetector CT imaging of the maxillofacial structures was performed with intravenous contrast. Multiplanar CT image reconstructions were also generated. RADIATION DOSE REDUCTION: This exam was performed according to the departmental dose-optimization program which includes automated exposure control, adjustment of the mA and/or kV according to patient size and/or use of iterative reconstruction technique. CONTRAST:  1109m OMNIPAQUE IOHEXOL 300 MG/ML  SOLN COMPARISON:  None Available. FINDINGS: Osseous: Bone detail is somewhat degraded by patient motion. No acute or healing fracture is present. No focal osseous lesion is present. Orbits: The globes and orbits are within normal limits. Detail is again somewhat limited by patient motion. Sinuses: The paranasal sinuses and mastoid air cells are clear. The scratched at mild mucosal thickening is present the anterior left maxillary sinus. The paranasal sinuses and mastoid air cells are otherwise clear. Soft tissues: Asymmetric soft tissue swelling enhancement is present over the left side of the nose. An area of hypoattenuation anteriorly just above the naris may represent a developing abscess measuring 10 x 4 x 9 mm. This does have some mass effect on the naris without obstruction Phlegm a tori changes appear to be limited to the nose without significant involvement of the orbit left facial involvement. Limited intracranial: The visualized  intracranial contents are within normal limits. IMPRESSION: 1. Asymmetric soft tissue swelling and enhancement over the left side of the nose compatible with cellulitis. 2. An area of hypoattenuation anteriorly just above the naris may represent a developing abscess measuring 10 x 4 x 9 mm. 3. No significant extension into the orbit or face. Electronically Signed   By: CSan MorelleM.D.   On: 03/26/2022 14:01      Assessment/Plan Principal Problem:   Cellulitis of nose Active Problems:   Abscess of nose   Benign essential hypertension   Paroxysmal atrial fibrillation (HCC)   HLD (hyperlipidemia)   Diabetes mellitus without complication (HCC)   Asthma   Depression with anxiety   [place]ometrial cancer [place]   Morbid obesity with BMI of 50.0-59.9, adult (HCC)   Assessment and Plan:  Cellulitis and abscess of nose: Temperature 99, no leukocytosis, does not meet criteria for sepsis.  Dr. HRaleigh Callasof ENT is consulted by EDP, "recommends admission to the hospitalist service for IV antibiotics, unasyn  If any worsening or involvement of the eyelid in 24 hours, need to worry about orbital cellulitis and would rescan.  He also recommends Afrin twice daily for 3 days".  - will admit to tele bed as inpatient - Empiric antimicrobial treatment with unasyn - PRN Zofran for nausea, tylenol, morphine and Percocet for pain - Blood cultures x 2  - ESR and CRP - Afrin twice daily  - IVF: Patient received 1 L normal saline bolus in ED  Benign essential hypertension -IV hydralazine as needed -Amlodipine, enalapril, metoprolol  Paroxysmal atrial fibrillation ([place]: Heart rate 111 -Eliquis is on hold for the preparation of endometrial cancer surgery -prn IV metoprolol 5 mg every 2 hour for heart rate > 125 -Oral metoprolol 12.5 mg 3 times daily  HLD (hyperlipidemia) -lipitor  Diabetes mellitus without complication ([place]: Recent A1c 6.7, well-controlled.  Patient still taking glipizide,  Jardiance, metformin and as needed glargine insulin 65 units daily.  Currently patient is not taking glargine insulin.  Blood sugar 130. -SSI  Asthma: Stable -Bronchodilators  Depression with anxiety -Currently no home medications  Endometrial cancer ([facility]: -Patient is scheduled for surgery by Dr. NErnestina Patcheson 1/30 -  Hold Eliquis  Morbid obesity with BMI of 50.0-59.9, adult [facility]): Body weight 149 kg, BMI 54.66 -Encourage losing weight -Exercise and healthy diet      DVT ppx: SQ Heparin   Code Status: Full code  Family Communication:  Yes, patient's husband  at bed side.  Disposition Plan:  Anticipate discharge back to previous environment  Consults called: Dr. [place] Callas of ENT  Admission status and Level of care: Telemetry Medical:   as inpt      Dispo: The patient is from: Home              Anticipated d/c is to: Home              Anticipated d/c date is: 2 days              Patient currently is not medically stable to d/c.    Severity of Illness:  The appropriate patient status for this patient is INPATIENT. Inpatient status is judged to be reasonable and necessary in order to provide the required intensity of service to ensure the patient's safety. The patient's presenting symptoms, physical exam findings, and initial radiographic and laboratory data in the context of their chronic comorbidities is felt to place them at high risk for further clinical deterioration. Furthermore, it is not anticipated that the patient will be medically stable for discharge from the hospital within 2 midnights of admission.   * I certify that at the point of admission it is my clinical judgment that the patient will require inpatient hospital care spanning beyond 2 midnights from the point of admission due to high intensity of service, high risk for further deterioration and high frequency of surveillance required.*       Date of Service 03/26/2022    [facility]   If 7PM-7AM, please contact night-coverage [email or web address] 03/26/2022, 4:08 PM

## 2022-03-26 NOTE — ED Provider Notes (Signed)
[facility] Provider Note    Event Date/Time   First MD Initiated Contact with Patient 03/26/22 1146     (approximate)   History   Facial Swelling (Patient presents with redness, swelling and pain to her nose; She was seen at [facility] and started on Augmentin for suspected cellulitis; Since then, her symptoms have worsened and the pain now radiates down to her jaw; She has a patent airway and does not appear to be in any distress at this time)   HPI  Joan Graves is a 52 y.o. female with a past medical history of diabetes and obesity who presents today for evaluation of nasal swelling and erythema.  Patient reports that her symptoms began 2 days ago but have worsened.  She reports that she wears a sleep apnea machine at night, and noticed more swelling over her nose and eye this morning.  She reports that she was seen in urgent care 2 days ago and started on Augmentin.  She denies fevers or chills.  She denies any pain with moving her eyes or double vision.Marland Kitchen  Her back.  No trouble opening her mouth.  Patient Active Problem List   Diagnosis Date Noted   Cellulitis of nose 03/26/2022   Endometrial cancer ([place]) 03/26/2022   Diabetes mellitus without complication ([place]) 72/10/4707   Morbid obesity with BMI of 50.0-59.9, adult ([place]) 03/26/2022   HLD (hyperlipidemia) 03/26/2022   Depression with anxiety 03/26/2022   Abscess of nose 03/26/2022   Family history of ovarian cancer 02/03/2022   PMB (postmenopausal bleeding) 02/03/2022   Esophageal dysphagia    Problems with swallowing and mastication    Paroxysmal atrial fibrillation ([place]) 12/09/2020   Chronic pulmonary hypertension ([place]) 06/11/2020   Moderate aortic valve stenosis 06/11/2020   Benign essential hypertension 12/02/2014   Hyperlipidemia, mixed 12/11/2013   Obstructive sleep apnea 12/11/2013   Asthma 12/09/2013   Diabetes ([place]) 12/09/2013   Gastroesophageal reflux disease 12/09/2013           Physical Exam   Triage Vital Signs: ED Triage Vitals  Enc Vitals Group     BP 03/26/22 1135 134/87     Pulse Rate 03/26/22 1135 (!) 111     Resp 03/26/22 1135 19     Temp 03/26/22 1135 98 F (36.7 C)     Temp src --      SpO2 03/26/22 1135 99 %     Weight 03/26/22 1133 (!) 328 lb 7.8 oz (149 kg)     Height 03/26/22 1133 '5\' 5"'$  (1.651 m)     Head Circumference --      Peak Flow --      Pain Score --      Pain Loc --      Pain Edu? --      Excl. in [place]? --     Most recent vital signs: Vitals:   03/26/22 1135  BP: 134/87  Pulse: (!) 111  Resp: 19  Temp: 98 F (36.7 C)  SpO2: 99%    Physical Exam Vitals and nursing note reviewed.  Constitutional:      General: Awake and alert. No acute distress.    Appearance: Normal appearance. The patient is obese.  HENT:     Head: Normocephalic and atraumatic.     Mouth: Mucous membranes are moist.  Swelling and erythema over the nasal bridge, extending laterally, with mild edema beneath the left eye without erythema.  Full and normal extraocular movements without  pain.  No eyelid edema or erythema.  There is purulence within the superior portion of the left nare Eyes:     General: PERRL. Normal EOMs        Right eye: No discharge.        Left eye: No discharge.     Conjunctiva/sclera: Conjunctivae normal.  Cardiovascular:     Rate and Rhythm: Tachycardic rate and regular rhythm.     Pulses: Normal pulses.  Pulmonary:     Effort: Pulmonary effort is normal. No respiratory distress.     Breath sounds: Normal breath sounds.  Abdominal:     Abdomen is soft. There is no abdominal tenderness. No rebound or guarding. No distention. Musculoskeletal:        General: No swelling. Normal range of motion.     Cervical back: Normal range of motion and neck supple.  Skin:    General: Skin is warm and dry.     Capillary Refill: Capillary refill takes less than 2 seconds.     Findings: No rash.  Neurological:     Mental Status: The  patient is awake and alert.      ED Results / Procedures / Treatments   Labs (all labs ordered are listed, but only abnormal results are displayed) Labs Reviewed  CBC WITH DIFFERENTIAL/PLATELET - Abnormal; Notable for the following components:      Result Value   RBC 5.16 (*)    All other components within normal limits  BASIC METABOLIC PANEL - Abnormal; Notable for the following components:   Glucose, Bld 130 (*)    All other components within normal limits  AEROBIC/ANAEROBIC CULTURE W GRAM STAIN (SURGICAL/DEEP WOUND)  CULTURE, BLOOD (ROUTINE X 2)  CULTURE, BLOOD (ROUTINE X 2)  PREGNANCY, URINE  SEDIMENTATION RATE  C-REACTIVE PROTEIN  HIV ANTIBODY (ROUTINE TESTING W REFLEX)     EKG     RADIOLOGY I independently reviewed and interpreted imaging and agree with radiologists findings.     PROCEDURES:  Critical Care performed:   Procedures   MEDICATIONS ORDERED IN ED: Medications  oxymetazoline (AFRIN) 0.05 % nasal spray 1 spray (1 spray Each Nare Given 03/26/22 1435)  albuterol (PROVENTIL) (2.5 MG/3ML) 0.083% nebulizer solution 2.5 mg (has no administration in time range)  dextromethorphan-guaiFENesin (MUCINEX DM) 30-600 MG per 12 hr tablet 1 tablet (has no administration in time range)  ondansetron (ZOFRAN) injection 4 mg (has no administration in time range)  hydrALAZINE (APRESOLINE) injection 5 mg (has no administration in time range)  acetaminophen (TYLENOL) tablet 650 mg (has no administration in time range)  oxyCODONE-acetaminophen (PERCOCET/ROXICET) 5-325 MG per tablet 1 tablet (has no administration in time range)  morphine (PF) 2 MG/ML injection 2 mg (has no administration in time range)  insulin aspart (novoLOG) injection 0-9 Units (has no administration in time range)  insulin aspart (novoLOG) injection 0-5 Units (has no administration in time range)  Ampicillin-Sulbactam (UNASYN) 3 g in sodium chloride 0.9 % 100 mL IVPB (has no administration in time  range)  heparin injection 5,000 Units (has no administration in time range)  sodium chloride 0.9 % bolus 1,000 mL (1,000 mLs Intravenous New Bag/Given 03/26/22 1249)  iohexol (OMNIPAQUE) 300 MG/ML solution 100 mL (100 mLs Intravenous Contrast Given 03/26/22 1326)  Ampicillin-Sulbactam (UNASYN) 3 g in sodium chloride 0.9 % 100 mL IVPB (3 g Intravenous New Bag/Given 03/26/22 1442)     IMPRESSION / MDM / ASSESSMENT AND PLAN / ED COURSE  I reviewed the triage vital signs and  the nursing notes.   Differential diagnosis includes, but is not limited to, nasal abscess, cartilage infection, cellulitis.  Patient presents emergency department tachycardic, though normotensive and afebrile.  She has full and normal extraocular movements without pain, no visual changes, no eyelid edema or erythema, no proptosis, not consistent with preseptal or orbital cellulitis.  She does have purulence within her left nare.  I recommended IV, basic labs, and CT of her face with contrast.  She currently has no visual changes or focal neurological deficits, no pain out of proportion, do not suspect abnormalities.  I do not suspect cavernous sinus thrombosis.   CT scan reveals findings consistent with cellulitis, with possible developing small abscess.  I discussed these findings with Dr. [place] Callas with ENT who has reviewed the images and recommends admission to the hospitalist service for IV antibiotics given outpatient antibiotic failure.  He reports that if she develops any eyelid edema or worsening swelling, he would recommend rescanning her.  He also recommends Afrin twice daily for 3 days.  The purulence in her left nare was swabbed.  Discussed all findings and recommendations with the patient who agrees with plan for admission.   Patient's presentation is most consistent with acute complicated illness / injury requiring diagnostic workup.   Clinical Course as of 03/26/22 1520  Sat Mar 26, 2022  1415 Discussed with Dr.  [place] Callas, ENT who recommends admission to the hospitalist service for IV antibiotics.  If any worsening or involvement of the eyelid in 24 hours, need to worry about orbital cellulitis and would rescan.  He also recommends Afrin twice daily for 3 days. [JP]    Clinical Course User Index [JP] Lajuanda Penick, Clarnce Flock, PA-C     FINAL CLINICAL IMPRESSION(S) / ED DIAGNOSES   Final diagnoses:  Facial cellulitis     Rx / DC Orders   ED Discharge Orders     None        Note:  This document was prepared using Dragon voice recognition software and may include unintentional dictation errors.   Emeline Gins 03/26/22 1520    Rada Hay, MD 03/26/22 [phone]

## 2022-03-26 NOTE — Consult Note (Signed)
Pharmacy Antibiotic Note  Joan Graves is a 52 y.o. female admitted on 03/26/2022 with cellulitis.  Pharmacy has been consulted for unasyn dosing.  Plan: Unasyn 3g IV every 6 hours Continue to monitor and dose adjust antibiotics according to renal function and indication   Height: '5\' 5"'$  (165.1 cm) Weight: (!) 149 kg (328 lb 7.8 oz) IBW/kg (Calculated) : 57  Temp (24hrs), Avg:98 F (36.7 C), Min:98 F (36.7 C), Max:98 F (36.7 C)  Recent Labs  Lab 03/26/22 1207  WBC 9.8  CREATININE 0.61    Estimated Creatinine Clearance: 124.6 mL/min (by C-G formula based on SCr of 0.61 mg/dL).    Allergies  Allergen Reactions   Aspirin Diarrhea    "high doses" gives her diarrhea    Antimicrobials this admission: 1/27 unasyn >>   Microbiology results: 1/27 BCx: sent 1/27 surgical wound cx: sent   Thank you for allowing pharmacy to be a part of this patient's care.  Darrick Penna 03/26/2022 2:56 PM

## 2022-03-27 DIAGNOSIS — J340 Abscess, furuncle and carbuncle of nose: Secondary | ICD-10-CM | POA: Diagnosis not present

## 2022-03-27 DIAGNOSIS — I480 Paroxysmal atrial fibrillation: Secondary | ICD-10-CM | POA: Diagnosis not present

## 2022-03-27 LAB — BASIC METABOLIC PANEL
Anion gap: 8 (ref 5–15)
BUN: 15 mg/dL (ref 6–20)
CO2: 25 mmol/L (ref 22–32)
Calcium: 8.7 mg/dL — ABNORMAL LOW (ref 8.9–10.3)
Chloride: 103 mmol/L (ref 98–111)
Creatinine, Ser: 0.64 mg/dL (ref 0.44–1.00)
GFR, Estimated: >60 mL/min (ref >60)
Glucose, Bld: 145 mg/dL — ABNORMAL HIGH (ref 70–99)
Potassium: 4.3 mmol/L (ref 3.5–5.1)
Sodium: 136 mmol/L (ref 135–145)

## 2022-03-27 LAB — CBC
HCT: 41.5 % (ref 36.0–46.0)
Hemoglobin: 12.6 g/dL (ref 12.0–15.0)
MCH: 26.4 pg (ref 26.0–34.0)
MCHC: 30.4 g/dL (ref 30.0–36.0)
MCV: 86.8 fL (ref 80.0–100.0)
Platelets: 256 10*3/uL (ref 150–400)
RBC: 4.78 MIL/uL (ref 3.87–5.11)
RDW: 14.1 % (ref 11.5–15.5)
WBC: 8.6 10*3/uL (ref 4.0–10.5)
nRBC: 0 % (ref 0.0–0.2)

## 2022-03-27 LAB — C-REACTIVE PROTEIN: CRP: 12.3 mg/dL — ABNORMAL HIGH (ref <1.0)

## 2022-03-27 LAB — HIV ANTIBODY (ROUTINE TESTING W REFLEX): HIV Screen 4th Generation wRfx: NONREACTIVE

## 2022-03-27 LAB — GLUCOSE, CAPILLARY
Glucose-Capillary: 153 mg/dL — ABNORMAL HIGH (ref 70–99)
Glucose-Capillary: 172 mg/dL — ABNORMAL HIGH (ref 70–99)
Glucose-Capillary: 154 mg/dL — ABNORMAL HIGH (ref 70–99)
Glucose-Capillary: 197 mg/dL — ABNORMAL HIGH (ref 70–99)

## 2022-03-27 MED ORDER — VANCOMYCIN HCL 1500 MG/300ML IV SOLN
1500.0000 mg | Freq: Two times a day (BID) | INTRAVENOUS | Status: DC
  Administered 2022-03-27 – 2022-03-28 (×2): 1500 mg via INTRAVENOUS
  Filled 2022-03-27 (×2): qty 300

## 2022-03-27 MED ORDER — VANCOMYCIN HCL 2000 MG/400ML IV SOLN
2000.0000 mg | Freq: Once | INTRAVENOUS | Status: AC
  Administered 2022-03-27: 2000 mg via INTRAVENOUS
  Filled 2022-03-27: qty 400

## 2022-03-27 NOTE — Consult Note (Signed)
Pharmacy Antibiotic Note  Joan Graves is a 52 y.o. female admitted on 03/26/2022 with cellulitis.  Pharmacy has been consulted for Unasyn and Vancomycin dosing.  Plan: Continue Unasyn 3gm IVPB q 6hrs Vancomycin '2000mg'$  IV x 1 dose load, then Vancomycin 1500 mg IV Q 12 hrs. Goal AUC 400-550. Expected AUC: 499.1 SCr used: 0.8 (actual 0.64)  Height: '5\' 5"'$  (165.1 cm) Weight: (!) 149 kg (328 lb 7.8 oz) IBW/kg (Calculated) : 57  Temp (24hrs), Avg:98.1 F (36.7 C), Min:97.8 F (36.6 C), Max:98.6 F (37 C)  Recent Labs  Lab 03/26/22 1207 03/27/22 0404  WBC 9.8 8.6  CREATININE 0.61 0.64    Estimated Creatinine Clearance: 124.6 mL/min (by C-G formula based on SCr of 0.64 mg/dL).    Allergies  Allergen Reactions   Aspirin Diarrhea    "high doses" gives her diarrhea    Antimicrobials this admission: 1/27 Unasyn >>  1/28 Vancomycin >>   Dose adjustments this admission: NA  Microbiology results: 1/27 BCx: NGTD 1/27 Wound cx: pending 1/28 MRSA PCR: ordered  Thank you for allowing pharmacy to be a part of this patient's care.  Maida Widger Rodriguez-Guzman PharmD, BCPS 03/27/2022 10:00 AM

## 2022-03-27 NOTE — Progress Notes (Signed)
  Progress Note   Patient: Joan Graves VFI:[id number] DOB: [birth year] DOA: 03/26/2022     1 DOS: the patient was seen and examined on 03/27/2022   Brief hospital course: Joan Graves is a 52 y.o. female with medical history significant of atrial fibrillation on Eliquis, pulmonary hypertension, hypertension, hyperlipidemia, diabetes mellitus, GERD, depression with anxiety, aortic stenosis, endometrial cancer (scheduled for surgery on 03/29/2022), who presents with nose pain.  Patient was started on Unasyn for cellulitis of the nose.  Patient was started on Unasyn, I added vancomycin.  Assessment and Plan: Cellulitis and abscess of left side of nose. Based on presentation, it does not appear to be shingles. CT scan also showed possible small abscess.  Will continue Unasyn for today.  I also added vancomycin.  MRSA culture sent out.  Essential hypertension. Continue home medicines  Paroxysmal atrial fibrillation. Continue metoprolol.  Patient is Eliquis was on hold due to preparation for endometrial cancer surgery.  Type 2 diabetes. Appear to be controlled, continue sliding scale insulin.  Morbid obesity with BMI 54.66. Diet exercise.   Endometrial cancer.  Follows with OB/GYN for surgery.     Subjective:  Some pain in the nose, otherwise no complaints.  Physical Exam: Vitals:   03/26/22 1700 03/26/22 1926 03/27/22 0409 03/27/22 0807  BP: 122/84 111/83 124/70 117/86  Pulse: 84 (!) 104 88 90  Resp: '20 20 20 18  '$ Temp: 98.2 F (36.8 C) 98.6 F (37 C) 97.8 F (36.6 C) 98.1 F (36.7 C)  TempSrc: Oral Oral Oral Oral  SpO2: 98% 100% 99% 98%  Weight:      Height:       General exam: Appears calm and comfortable  Respiratory system: Clear to auscultation. Respiratory effort normal. Cardiovascular system: S1 & S2 heard, RRR. No JVD, murmurs, rubs, gallops or clicks. No pedal edema. Gastrointestinal system: Abdomen is nondistended, soft and nontender. No  organomegaly or masses felt. Normal bowel sounds heard. Central nervous system: Alert and oriented. No focal neurological deficits. Extremities: Symmetric 5 x 5 power. Skin: No rashes, lesions or ulcers Psychiatry: Judgement and insight appear normal. Mood & affect appropriate.  Left side of snare redness, tender to touch. Data Reviewed:  CT scan and the lab results reviewed.  Family Communication: None  Disposition: Status is: Inpatient Remains inpatient appropriate because: Severity of disease.  Planned Discharge Destination: Home    Time spent: 35 minutes  Author: Sharen Hones, MD 03/27/2022 2:04 PM  For on call review [email or web address].

## 2022-03-27 NOTE — Hospital Course (Signed)
Joan Graves [employer] is a 52 y.o. female with medical history significant of atrial fibrillation on Eliquis, pulmonary hypertension, hypertension, hyperlipidemia, diabetes mellitus, GERD, depression with anxiety, aortic stenosis, endometrial cancer (scheduled for surgery on 03/29/2022), who presents with nose pain.  Patient was started on Unasyn for cellulitis of the nose.  Patient was started on Unasyn, I added vancomycin.

## 2022-03-28 ENCOUNTER — Telehealth: Payer: Self-pay | Admitting: *Deleted

## 2022-03-28 DIAGNOSIS — J340 Abscess, furuncle and carbuncle of nose: Secondary | ICD-10-CM | POA: Diagnosis not present

## 2022-03-28 DIAGNOSIS — E119 Type 2 diabetes mellitus without complications: Secondary | ICD-10-CM | POA: Diagnosis not present

## 2022-03-28 DIAGNOSIS — I480 Paroxysmal atrial fibrillation: Secondary | ICD-10-CM | POA: Diagnosis not present

## 2022-03-28 LAB — GLUCOSE, CAPILLARY: Glucose-Capillary: 150 mg/dL — ABNORMAL HIGH (ref 70–99)

## 2022-03-28 MED ORDER — DOXYCYCLINE MONOHYDRATE 100 MG PO TABS: 100.0000 mg | ORAL_TABLET | Freq: Two times a day (BID) | ORAL | 0 refills | Status: DC

## 2022-03-28 MED ORDER — AMOXICILLIN-POT CLAVULANATE 875-125 MG PO TABS: 1.0000 | ORAL_TABLET | Freq: Two times a day (BID) | ORAL | 0 refills | Status: DC

## 2022-03-28 MED ORDER — BASAGLAR KWIKPEN 100 UNIT/ML ~~LOC~~ SOPN: 20.0000 [IU] | PEN_INJECTOR | Freq: Every evening | SUBCUTANEOUS | Status: DC | PRN

## 2022-03-28 MED ORDER — SULFAMETHOXAZOLE-TRIMETHOPRIM 800-160 MG PO TABS
1.0000 | ORAL_TABLET | Freq: Once | ORAL | Status: DC
  Filled 2022-03-28: qty 1

## 2022-03-28 MED ORDER — SULFAMETHOXAZOLE-TRIMETHOPRIM 800-160 MG PO TABS: 1.0000 | ORAL_TABLET | Freq: Two times a day (BID) | ORAL | 0 refills | Status: AC

## 2022-03-28 NOTE — TOC Initial Note (Signed)
Transition of Care [facility]) - Initial/Assessment Note    Patient Details  Name: Joan Graves MRN: [id number] Date of Birth: [birth year]  Transition of Care [facility]) CM/SW Contact:    Beverly Sessions, RN Phone Number: 03/28/2022, 9:27 AM  Clinical Narrative:                   Transition of Care [facility]) Screening Note   Patient Details  Name: Joan Graves Date of Birth: [birth year]   Transition of Care [facility]) CM/SW Contact:    Beverly Sessions, RN Phone Number: 03/28/2022, 9:28 AM    Transition of Care Department [facility]) has reviewed patient and no TOC needs have been identified at this time. We will continue to monitor patient advancement through interdisciplinary progression rounds. If new patient transition needs arise, please place a TOC consult.         Patient Goals and CMS Choice            Expected Discharge Plan and Services                                              Prior Living Arrangements/Services                       Activities of Daily Living Home Assistive Devices/Equipment: None ADL Screening (condition at time of admission) Patient's cognitive ability adequate to safely complete daily activities?: Yes Is the patient deaf or have difficulty hearing?: No Does the patient have difficulty seeing, even when wearing glasses/contacts?: No Does the patient have difficulty concentrating, remembering, or making decisions?: No Patient able to express need for assistance with ADLs?: Yes Does the patient have difficulty dressing or bathing?: No Independently performs ADLs?: Yes (appropriate for developmental age) Does the patient have difficulty walking or climbing stairs?: Yes Weakness of Legs: Both Weakness of Arms/Hands: Both  Permission Sought/Granted                  Emotional Assessment              Admission diagnosis:  Cellulitis of nose [J34.0] Facial cellulitis [L03.211] Patient Active Problem  List   Diagnosis Date Noted   Cellulitis of nose 03/26/2022   Endometrial cancer ([place]) 03/26/2022   Diabetes mellitus without complication ([place]) 42/87/6811   Morbid obesity with BMI of 50.0-59.9, adult ([place]) 03/26/2022   HLD (hyperlipidemia) 03/26/2022   Depression with anxiety 03/26/2022   Abscess of nose 03/26/2022   Family history of ovarian cancer 02/03/2022   PMB (postmenopausal bleeding) 02/03/2022   Esophageal dysphagia    Problems with swallowing and mastication    Paroxysmal atrial fibrillation ([place]) 12/09/2020   Chronic pulmonary hypertension ([place]) 06/11/2020   Moderate aortic valve stenosis 06/11/2020   Benign essential hypertension 12/02/2014   Hyperlipidemia, mixed 12/11/2013   Obstructive sleep apnea 12/11/2013   Asthma 12/09/2013   Diabetes ([place]) 12/09/2013   Gastroesophageal reflux disease 12/09/2013   PCP:  [facility]:   [id number] [place] - [address]: [phone]: [phone] [address] [place]: [phone]: [phone]    Social Determinants of Health (SDOH) Social  History: SDOH Screenings   Food Insecurity: Unknown (03/26/2022)  Utilities: Unknown (03/26/2022)  Tobacco Use: Medium Risk (03/26/2022)   SDOH Interventions:     Readmission Risk Interventions     No data to display

## 2022-03-28 NOTE — Telephone Encounter (Signed)
Per Joylene John NP, Scheduled PCP appointment at [facility] for 04-04-22 at 1:20pm. Also advised can restart Eliquis but hold again 3 days before surgery. Call to patient's nurse, Santiago Glad, at hospital. Advised of PCP appointment and to resume Eliquis. Santiago Glad reports no ENT referral unless PCP feels it is needed.    Post- op appointment rescheduled to 05-02-22 at 1:15 pm.

## 2022-03-28 NOTE — Discharge Summary (Addendum)
Physician Discharge Summary   Patient: Joan Graves MRN: [id number] DOB: December 29, 1970  Admit date:     03/26/2022  Discharge date: 03/28/22  Discharge Physician: Sharen Hones   PCP: [facility] [place]   Recommendations at discharge:   Follow-up with PCP in 1 week. May refer to ENT if condition does not totally resolve.  Check a BMP at the next office visit as patient taking Bactrim.  Discharge Diagnoses: Principal Problem:   Cellulitis of nose Active Problems:   Abscess of nose   Benign essential hypertension   Paroxysmal atrial fibrillation (HCC)   HLD (hyperlipidemia)   Diabetes mellitus without complication (HCC)   Asthma   Depression with anxiety   Endometrial cancer ([place])   Morbid obesity with BMI of 50.0-59.9, adult ([place])  Resolved Problems:   * No resolved hospital problems. *  Hospital Course: Joan Graves is a 52 y.o. female with medical history significant of atrial fibrillation on Eliquis, pulmonary hypertension, hypertension, hyperlipidemia, diabetes mellitus, GERD, depression with anxiety, aortic stenosis, endometrial cancer (scheduled for surgery on 03/29/2022), who presents with nose pain.  Patient was started on Unasyn for cellulitis of the nose.  Patient was started on Unasyn, I added vancomycin.  Assessment and Plan:  Cellulitis and abscess of left side of nose due to MRSA. Based on presentation, it does not appear to be shingles. CT scan also showed possible small abscess.   She is treated with Unasyn, added vancomycin yesterday.  No swelling or redness much much improved today.  At this point she is medically stable to be discharged.  I will continue complete additional 5 days of Augmentin, came back with MRSA.  Will continue 5 days of Bactrim.  Encourage patient to drink water, check a BMP at next office visit. I have sent Bactrim to the pharmacy, and also canceled doxycycline.    Essential hypertension. Continue home  medicines   Paroxysmal atrial fibrillation. Continue metoprolol.  Patient is Eliquis was on hold due to preparation for endometrial cancer surgery.   Type 2 diabetes. Patient was using Lantus as needed at home, I reduce that to 20 units as needed.  Patient glucose not significantly elevated.  Follow-up with PCP.   Morbid obesity with BMI 54.66. Diet exercise.     Endometrial cancer.   Addendum: Discussed with OB/GYN, surgery will be postponed due to infection.  I will restart Eliquis at discharge, OB/GYN can discontinued prior to surgery.      Consultants: None Procedures performed: None  Disposition: Home Diet recommendation:  Discharge Diet Orders (From admission, onward)     Start     Ordered   03/28/22 0000  Diet - low sodium heart healthy        03/28/22 1026           Cardiac diet DISCHARGE MEDICATION: Allergies as of 03/28/2022       Reactions   Aspirin Diarrhea   "high doses" gives her diarrhea        Medication List     STOP taking these medications    Eliquis 5 MG Tabs tablet Generic drug: apixaban   glipiZIDE 10 MG 24 hr tablet Commonly known as: GLUCOTROL XL       TAKE these medications    Accu-Chek Softclix Lancets lancets CHECK GLUCOSE TWICE DIALY FOR GLUCOSE MONITORING E11.9   acetaminophen 500 MG tablet Commonly known as: TYLENOL Take 1,000 mg by mouth every 6 (six) hours as needed for mild pain.  Advair Diskus 500-50 MCG/ACT Aepb Generic drug: fluticasone-salmeterol Inhale 1 puff into the lungs in the morning and at bedtime.   amLODipine 10 MG tablet Commonly known as: NORVASC Take 1 tablet by mouth daily.   amoxicillin-clavulanate 875-125 MG tablet Commonly known as: AUGMENTIN Take 1 tablet by mouth every 12 (twelve) hours for 7 days. Complete additional 5 days dose What changed: additional instructions   atorvastatin 20 MG tablet Commonly known as: LIPITOR Take 20 mg by mouth daily.   Basaglar KwikPen 100  UNIT/ML Inject 20 Units into the skin at bedtime as needed (if CBG over 200). What changed: how much to take   buPROPion ER 100 MG 12 hr tablet Commonly known as: WELLBUTRIN SR Take 100 mg by mouth 2 (two) times daily.   diphenoxylate-atropine 2.5-0.025 MG tablet Commonly known as: LOMOTIL Take 1 tablet by mouth 4 (four) times daily.   doxycycline 100 MG tablet Commonly known as: ADOXA Take 1 tablet (100 mg total) by mouth 2 (two) times daily for 5 days.   enalapril 20 MG tablet Commonly known as: VASOTEC Take 20 mg by mouth daily.   ferrous sulfate 325 (65 FE) MG EC tablet Take 650 mg by mouth daily with breakfast.   glucose blood test strip USE AS DIRECTED TWICE A DAY   Jardiance 25 MG Tabs tablet Generic drug: empagliflozin Take 25 mg by mouth daily.   metFORMIN 500 MG 24 hr tablet Commonly known as: GLUCOPHAGE-XR Take 1,000 mg by mouth 2 (two) times daily with a meal.   metoprolol tartrate 25 MG tablet Commonly known as: LOPRESSOR Take 12.5 mg by mouth 3 (three) times daily.   omeprazole 40 MG capsule Commonly known as: PRILOSEC Take 40 mg by mouth daily.   senna-docusate 8.6-50 MG tablet Commonly known as: Senokot-S Take 2 tablets by mouth at bedtime. For AFTER surgery, do not take if having diarrhea   traMADol 50 MG tablet Commonly known as: ULTRAM Take 1 tablet (50 mg total) by mouth every 6 (six) hours as needed for severe pain. For AFTER surgery only, do not take and drive        Follow-up [place] [place] Follow up in 1 week(s).   Specialty: General Practice Contact information: [place] [address] [phone]                Discharge Exam: Danley Danker Weights   03/26/22 1133  Weight: (!) 149 kg   General exam: Appears calm and comfortable, morbid obese. Respiratory system: Clear to auscultation. Respiratory effort normal. Cardiovascular system: S1 & S2 heard, RRR. No  JVD, murmurs, rubs, gallops or clicks. No pedal edema. Gastrointestinal system: Abdomen is nondistended, soft and nontender. No organomegaly or masses felt. Normal bowel sounds heard. Central nervous system: Alert and oriented. No focal neurological deficits. Extremities: Symmetric 5 x 5 power. Skin: No rashes, lesions or ulcers Psychiatry: Judgement and insight appear normal. Mood & affect appropriate.  Nasal swelling and redness much improved.  Condition at discharge: good  The results of significant diagnostics from this hospitalization (including imaging, microbiology, ancillary and laboratory) are listed below for reference.   Imaging Studies: CT MAXILLOFACIAL W CONTRAST  Result Date: 03/26/2022 CLINICAL DATA:  Nasal abscess EXAM: CT MAXILLOFACIAL WITH CONTRAST TECHNIQUE: Multidetector CT imaging of the maxillofacial structures was performed with intravenous contrast. Multiplanar CT image reconstructions were also generated. RADIATION DOSE REDUCTION: This exam was performed according to the departmental dose-optimization program  which includes automated exposure control, adjustment of the mA and/or kV according to patient size and/or use of iterative reconstruction technique. CONTRAST:  146m OMNIPAQUE IOHEXOL 300 MG/ML  SOLN COMPARISON:  None Available. FINDINGS: Osseous: Bone detail is somewhat degraded by patient motion. No acute or healing fracture is present. No focal osseous lesion is present. Orbits: The globes and orbits are within normal limits. Detail is again somewhat limited by patient motion. Sinuses: The paranasal sinuses and mastoid air cells are clear. The scratched at mild mucosal thickening is present the anterior left maxillary sinus. The paranasal sinuses and mastoid air cells are otherwise clear. Soft tissues: Asymmetric soft tissue swelling enhancement is present over the left side of the nose. An area of hypoattenuation anteriorly just above the naris may represent a  developing abscess measuring 10 x 4 x 9 mm. This does have some mass effect on the naris without obstruction Phlegm a tori changes appear to be limited to the nose without significant involvement of the orbit left facial involvement. Limited intracranial: The visualized intracranial contents are within normal limits. IMPRESSION: 1. Asymmetric soft tissue swelling and enhancement over the left side of the nose compatible with cellulitis. 2. An area of hypoattenuation anteriorly just above the naris may represent a developing abscess measuring 10 x 4 x 9 mm. 3. No significant extension into the orbit or face. Electronically Signed   By: CSan MorelleM.D.   On: 03/26/2022 14:01   CT ABDOMEN PELVIS W CONTRAST  Result Date: 03/15/2022 CLINICAL DATA:  Abdominal pain, acute, nonlocalized EXAM: CT ABDOMEN AND PELVIS WITH CONTRAST TECHNIQUE: Multidetector CT imaging of the abdomen and pelvis was performed using the standard protocol following bolus administration of intravenous contrast. RADIATION DOSE REDUCTION: This exam was performed according to the departmental dose-optimization program which includes automated exposure control, adjustment of the mA and/or kV according to patient size and/or use of iterative reconstruction technique. CONTRAST:  1263mOMNIPAQUE IOHEXOL 350 MG/ML SOLN COMPARISON:  CT abdomen pelvis 03/09/2022 FINDINGS: Lower chest: No acute abnormality.  Coronary artery calcification. Hepatobiliary: No focal liver abnormality. No gallstones, gallbladder wall thickening, or pericholecystic fluid. No biliary dilatation. Pancreas: No focal lesion. Normal pancreatic contour. No surrounding inflammatory changes. No main pancreatic ductal dilatation. Spleen: Normal in size without focal abnormality.  Splenule noted. Adrenals/Urinary Tract: No adrenal nodule bilaterally. Bilateral kidneys enhance symmetrically. No hydronephrosis. No hydroureter. The urinary bladder is unremarkable. Stomach/Bowel:  Stomach is within normal limits. No evidence of bowel wall thickening or dilatation. Appendix appears normal. Vascular/Lymphatic: No abdominal aorta or iliac aneurysm. No abdominal, pelvic, or inguinal lymphadenopathy. Reproductive: Uterus and bilateral adnexa are unremarkable. Other: No intraperitoneal free fluid. No intraperitoneal free gas. No organized fluid collection. Musculoskeletal: Small fat containing umbilical hernia. Mild superficial subcutaneus soft tissue edema of the anterior abdominal wall. No suspicious lytic or blastic osseous lesions. No acute displaced fracture. IMPRESSION: 1. No acute intra-abdominal or intrapelvic abnormality. 2. Small fat containing umbilical hernia. Electronically Signed   By: MoIven Finn.D.   On: 03/15/2022 02:45   CT Abdomen Pelvis W Contrast  Result Date: 03/10/2022 CLINICAL DATA:  Newly diagnosed endometrial carcinoma. Staging. * Tracking Code: BO * EXAM: CT ABDOMEN AND PELVIS WITH CONTRAST TECHNIQUE: Multidetector CT imaging of the abdomen and pelvis was performed using the standard protocol following bolus administration of intravenous contrast. RADIATION DOSE REDUCTION: This exam was performed according to the departmental dose-optimization program which includes automated exposure control, adjustment of the mA and/or kV according to patient  size and/or use of iterative reconstruction technique. CONTRAST:  193m OMNIPAQUE IOHEXOL 300 MG/ML  SOLN COMPARISON:  None Available. FINDINGS: Lower Chest: No acute findings. Hepatobiliary: No hepatic masses identified. Gallbladder is unremarkable. No evidence of biliary ductal dilatation. Pancreas:  No mass or inflammatory changes. Spleen: Within normal limits in size and appearance. Adrenals/Urinary Tract: No suspicious masses identified. No evidence of ureteral calculi or hydronephrosis. Unremarkable unopacified urinary bladder. Stomach/Bowel: No evidence of obstruction, inflammatory process or abnormal fluid  collections. Although the appendix is not directly visualized, no inflammatory process seen in region of the cecum or elsewhere. Vascular/Lymphatic: A 9 mm left common iliac lymph node is seen on image 57/2. No pathologically enlarged lymph nodes identified. No acute vascular findings. Reproductive:  Normal size uterus. Other:  Small umbilical hernia noted, which contains only fat. Musculoskeletal:  No suspicious bone lesions identified. IMPRESSION: 9 mm left common iliac lymph node is not pathologically enlarged, but remains indeterminate. Recommend continued attention on follow-up imaging. No definite metastatic disease identified within the abdomen or pelvis. Small umbilical hernia, which contains only fat. Electronically Signed   By: JMarlaine HindM.D.   On: 03/10/2022 14:42    Microbiology: Results for orders placed or performed during the hospital encounter of 03/26/22  Aerobic/Anaerobic Culture w Gram Stain (surgical/deep wound)     Status: None (Preliminary result)   Collection Time: 03/26/22  2:39 PM   Specimen: Wound  Result Value Ref Range Status   Specimen Description   Final    WOUND [facility] at [facility] [address], [place] [address]   Special Requests   Final    NONE [facility] at [facility] [address], [place] [address]   Gram Stain   Final    RARE WBC PRESENT, PREDOMINANTLY PMN NO ORGANISMS SEEN [facility]formed at [facility] [address] [place] [address]   Culture FEW METHICILLIN RESISTANT STAPHYLOCOCCUS AUREUS  Final   Report Status PENDING  Incomplete   Organism ID, Bacteria METHICILLIN RESISTANT STAPHYLOCOCCUS AUREUS  Final      Susceptibility   Methicillin resistant staphylococcus aureus - MIC*    CIPROFLOXACIN 4 RESISTANT Resistant     ERYTHROMYCIN <=0.25 SENSITIVE Sensitive     GENTAMICIN <=0.5 SENSITIVE Sensitive     OXACILLIN >=4 RESISTANT Resistant     TETRACYCLINE >=16 RESISTANT Resistant      VANCOMYCIN <=0.5 SENSITIVE Sensitive     TRIMETH/SULFA <=10 SENSITIVE Sensitive     CLINDAMYCIN <=0.25 SENSITIVE Sensitive     RIFAMPIN <=0.5 SENSITIVE Sensitive     Inducible Clindamycin NEGATIVE Sensitive     * FEW METHICILLIN RESISTANT STAPHYLOCOCCUS AUREUS  Culture, blood (Routine X 2) w Reflex to ID Panel     Status: None (Preliminary result)   Collection Time: 03/26/22  5:57 PM   Specimen: BLOOD  Result Value Ref Range Status   Specimen Description BLOOD BLOOD RIGHT HAND  Final   Special Requests   Final    BOTTLES DRAWN AEROBIC AND ANAEROBIC Blood Culture adequate volume   Culture   Final    NO GROWTH 2 DAYS Performed at [facility] [address], [place] [address]   Report Status PENDING  Incomplete  Culture, blood (Routine X 2) w Reflex to ID Panel     Status: None (Preliminary result)   Collection Time: 03/26/22  6:04 PM   Specimen: BLOOD  Result Value Ref Range Status  Specimen Description BLOOD BLOOD LEFT HAND  Final   Special Requests   Final    BOTTLES DRAWN AEROBIC ONLY Blood Culture results may not be optimal due to an inadequate volume of blood received in culture bottles   Culture   Final    NO GROWTH 2 DAYS Performed at [facility], [address] [place], [place] [address]    Report Status PENDING  Incomplete    Labs: CBC: Recent Labs  Lab 03/26/22 1207 03/27/22 0404  WBC 9.8 8.6  NEUTROABS 7.4  --   HGB 13.8 12.6  HCT 44.8 41.5  MCV 86.8 86.8  PLT 228 233   Basic Metabolic Panel: Recent Labs  Lab 03/26/22 1207 03/27/22 0404  NA 139 136  K 3.9 4.3  CL 104 103  CO2 24 25  GLUCOSE 130* 145*  BUN 14 15  CREATININE 0.61 0.64  CALCIUM 9.0 8.7*   Liver Function Tests: No results for input(s): "AST", "ALT", "ALKPHOS", "BILITOT", "PROT", "ALBUMIN" in the last 168 hours. CBG: Recent Labs  Lab 03/27/22 0809 03/27/22 1119 03/27/22 1632 03/27/22 2018 03/28/22 0801  GLUCAP 153* 172* 154* 197* 150*     Discharge time spent: greater than 30 minutes.  Signed: Sharen Hones, MD [facility] 03/28/2022

## 2022-03-28 NOTE — Telephone Encounter (Signed)
Planned pre-op call to patient.  Patient noted to be admitted at [facility] for facial cellulitis. Was admitted 03-26-22.  Call to patient to get update. States she is to be discharged today. When advised will need to review with MD ability to proceed with surgery as scheduled, patient very upset and crying out. Patient's nurse Santiago Glad into room and calmed patient after a couple of minutes. Spoke with Santiago Glad- possible ENT consult before discharge.   Per Joylene John, NP, will need to reschedule surgery.  Call back to Santiago Glad at [facility] will need to reschedule surgery. Santiago Glad reports MRSA culture is pending. Patient has some type of limited comprehension, not noted in chart. She cried all day yesterday due to missing family event.      Called patient to review- no answer, left voice mail to call office. Call to patient's husband lawrence- he is on phone with her so added call to speaker- advised will need to reschedule. Patient immediately crying and no longer responding to me. Support provided. Advised husband to call back if additional questions.

## 2022-03-29 DIAGNOSIS — Z01818 Encounter for other preprocedural examination: Secondary | ICD-10-CM

## 2022-03-29 DIAGNOSIS — C541 Malignant neoplasm of endometrium: Secondary | ICD-10-CM

## 2022-03-29 LAB — TYPE AND SCREEN
ABO/RH(D): O POS
Antibody Screen: POSITIVE
Unit division: 0
Unit division: 0

## 2022-03-29 LAB — BPAM RBC
Blood Product Expiration Date: 202402222359
Blood Product Expiration Date: 202402222359
Unit Type and Rh: 5100
Unit Type and Rh: 5100

## 2022-03-29 LAB — MRSA CULTURE

## 2022-03-31 HISTORY — PX: TOTAL VAGINAL HYSTERECTOMY: SHX2548

## 2022-03-31 LAB — AEROBIC/ANAEROBIC CULTURE W GRAM STAIN (SURGICAL/DEEP WOUND)

## 2022-03-31 LAB — CULTURE, BLOOD (ROUTINE X 2)
Culture: NO GROWTH
Culture: NO GROWTH
Special Requests: ADEQUATE

## 2022-04-04 ENCOUNTER — Ambulatory Visit (INDEPENDENT_AMBULATORY_CARE_PROVIDER_SITE_OTHER): Payer: 59 | Admitting: Physician Assistant

## 2022-04-04 VITALS — BP 113/69 | HR 99 | Wt 315.0 lb

## 2022-04-04 DIAGNOSIS — N3946 Mixed incontinence: Secondary | ICD-10-CM | POA: Diagnosis not present

## 2022-04-04 DIAGNOSIS — C541 Malignant neoplasm of endometrium: Secondary | ICD-10-CM | POA: Diagnosis not present

## 2022-04-04 DIAGNOSIS — L0390 Cellulitis, unspecified: Secondary | ICD-10-CM | POA: Insufficient documentation

## 2022-04-04 NOTE — Progress Notes (Signed)
04/04/2022 3:08 PM   Joan Graves [birth year] [id number]  CC: Chief Complaint  Patient presents with   Follow-up    After PTNS completion    HPI: Joan Graves is a 52 y.o. female with PMH obesity, DM2, OSA, OAB wet with mixed incontinence, and endometrial cancer scheduled to undergo robotic total hysterectomy next week who presents today for PTNS follow up.   Today she reports She thinks PTNS helped "half." She states she is still leaking, though not as bad as before. She states that on one half things are better, but on the other half she is still having symptoms. History is rather challenging today.  Per symptom review sheets from her PTNS treatments, her reported daytime frequency, nocturia, urgency, and leakage episodes remained stable between her first and last treatments.  PMH: Past Medical History:  Diagnosis Date   Anemia    Anxiety    Aortic valve stenosis    Asthma    Cancer ([place])    endometrial cancer   Chronic pulmonary hypertension ([place])    Depression    Diabetes mellitus without complication (HCC)    GERD (gastroesophageal reflux disease)    Hypercholesteremia    Hypertension    Paroxysmal atrial fibrillation ([place])    Sleep apnea     Surgical History: Past Surgical History:  Procedure Laterality Date   CARDIOVERSION N/A 12/30/2020   Procedure: CARDIOVERSION;  Surgeon: Corey Skains, MD;  Location: [facility] ORS;  Service: Cardiovascular;  Laterality: N/A;   CATARACT EXTRACTION Right 11/2021   CATARACT EXTRACTION Left 12/2021   COLONOSCOPY     ESOPHAGOGASTRODUODENOSCOPY (EGD) WITH PROPOFOL N/A 06/29/2021   Procedure: ESOPHAGOGASTRODUODENOSCOPY (EGD) WITH PROPOFOL;  Surgeon: Lucilla Lame, MD;  Location: [facility] ENDOSCOPY;  Service: Endoscopy;  Laterality: N/A;   TUBAL LIGATION  2000    Home Medications:  Allergies as of 04/04/2022       Reactions   Aspirin Diarrhea   "high doses" gives her diarrhea        Medication List         Accurate as of April 04, 2022  3:08 PM. If you have any questions, ask your nurse or doctor.          STOP taking these medications    acetaminophen 500 MG tablet Commonly known as: TYLENOL Stopped by: Debroah Loop, PA-C   amoxicillin-clavulanate 875-125 MG tablet Commonly known as: AUGMENTIN Stopped by: Debroah Loop, PA-C   diphenoxylate-atropine 2.5-0.025 MG tablet Commonly known as: LOMOTIL Stopped by: Debroah Loop, PA-C   senna-docusate 8.6-50 MG tablet Commonly known as: Senokot-S Stopped by: Debroah Loop, PA-C   traMADol 50 MG tablet Commonly known as: ULTRAM Stopped by: Debroah Loop, PA-C       TAKE these medications    Accu-Chek Softclix Lancets lancets CHECK GLUCOSE TWICE DIALY FOR GLUCOSE MONITORING E11.9   Advair Diskus 500-50 MCG/ACT Aepb Generic drug: fluticasone-salmeterol Inhale 1 puff into the lungs in the morning and at bedtime.   amLODipine 10 MG tablet Commonly known as: NORVASC Take 1 tablet by mouth daily.   atorvastatin 20 MG tablet Commonly known as: LIPITOR Take 20 mg by mouth daily.   Basaglar KwikPen 100 UNIT/ML Inject 20 Units into the skin at bedtime as needed (if CBG over 200).   buPROPion ER 100 MG 12 hr tablet Commonly known as: WELLBUTRIN SR Take 100 mg by mouth 2 (two) times daily.   Eliquis 5 MG Tabs tablet Generic drug: apixaban Take 5 mg by mouth  2 (two) times daily.   enalapril 20 MG tablet Commonly known as: VASOTEC Take 20 mg by mouth daily.   ferrous sulfate 325 (65 FE) MG EC tablet Take 650 mg by mouth daily with breakfast.   glucose blood test strip USE AS DIRECTED TWICE A DAY   Jardiance 25 MG Tabs tablet Generic drug: empagliflozin Take 25 mg by mouth daily.   metFORMIN 500 MG 24 hr tablet Commonly known as: GLUCOPHAGE-XR Take 1,000 mg by mouth 2 (two) times daily with a meal.   metoprolol tartrate 25 MG tablet Commonly known as:  LOPRESSOR Take 12.5 mg by mouth 3 (three) times daily.   omeprazole 40 MG capsule Commonly known as: PRILOSEC Take 40 mg by mouth daily.        Allergies:  Allergies  Allergen Reactions   Aspirin Diarrhea    "high doses" gives her diarrhea    Family History: Family History  Problem Relation Age of Onset   Ovarian cancer Mother        mid 60s   Lung cancer Mother 17   Throat cancer Maternal Grandmother    Colon cancer Neg Hx    Breast cancer Neg Hx    Endometrial cancer Neg Hx    Pancreatic cancer Neg Hx    Prostate cancer Neg Hx     Social History:   reports that she has quit smoking. Her smoking use included cigarettes. She has been exposed to tobacco smoke. She has never used smokeless tobacco. She reports that she does not currently use alcohol. She reports that she does not use drugs.  Physical Exam: BP 113/69   Pulse 99   Wt (!) 315 lb (142.9 kg)   LMP 10/06/2017 (Approximate) Comment: Pt did not have period for approx 4 years then started periods again this spring, irregular. neg preg test  BMI 52.42 kg/m   Constitutional:  Alert and oriented, no acute distress, nontoxic appearing HEENT: [place], AT Cardiovascular: No clubbing, cyanosis, or edema Respiratory: Normal respiratory effort, no increased work of breathing Skin: No rashes, bruises or suspicious lesions Neurologic: Grossly intact, no focal deficits, moving all 4 extremities Psychiatric: Normal mood and affect  Assessment & Plan:   1. Mixed incontinence She has completed PTNS x12. Her weekly reported symptoms appear stable, however today she reports some symptomatic improvement. History remains quite challenging. Will continue monthly PTNS given reports of improvement today.  2. Endometrial cancer ([place]) Scheduled for hysterectomy next week. We discussed that her pelvic anatomy is going to change with this and I anticipate her urinary symptoms may evolve as she recovers. I do not recommend making  significant changes to her urologic care in the immediate postoperative period. Will have her follow up with Dr. Matilde Sprang about 3 months postop for symptom recheck and reassessment.   Return in about 2 days (around 04/06/2022) for PTNS maintenance; 3 months for symptom recheck/reassessment s/p hysterectomy with Dr. Matilde Sprang.  Debroah Loop, PA-C  [facility] [address] [place], [place] [address] [phone]

## 2022-04-05 NOTE — Patient Instructions (Addendum)
SURGICAL WAITING ROOM VISITATION Patients having surgery or a procedure may have no more than 2 support people in the waiting area - these visitors may rotate.    If the patient needs to stay at the hospital during part of their recovery, the visitor guidelines for inpatient rooms apply. Pre-op nurse will coordinate an appropriate time for 1 support person to accompany patient in pre-op.  This support person may not rotate.    Please refer to the [facility] website for the visitor guidelines for Inpatients (after your surgery is over and you are in a regular room).   Due to an increase in RSV and influenza rates and associated hospitalizations, children ages 17 and under may not visit patients in [place].     Your procedure is scheduled on:  04-12-22   Report to [facility] Main Entrance    Report to admitting at 9:45 AM   Call this number if you have problems the morning of surgery [phone]   Follow a light diet the day before surgery (avoid gas producing foods)   Do not eat food :After Midnight.   After Midnight you may have the following liquids until 9:00 AM DAY OF SURGERY  Water Non-Citrus Juices (without pulp, NO RED) Carbonated Beverages Black Coffee (NO MILK/CREAM OR CREAMERS, sugar ok)  Clear Tea (NO MILK/CREAM OR CREAMERS, sugar ok) regular and decaf                             Plain Jell-O (NO RED)                                           Fruit ices (not with fruit pulp, NO RED)                                     Popsicles (NO RED)                                                               Sports drinks like Gatorade (NO RED)                      If you have questions, please contact your surgeon's office.   FOLLOW BOWEL PREP AND ANY ADDITIONAL PRE OP INSTRUCTIONS YOU RECEIVED FROM YOUR SURGEON'S OFFICE!!!     Oral Hygiene is also important to reduce your risk of infection.                                    Remember - BRUSH YOUR  TEETH THE MORNING OF SURGERY WITH YOUR REGULAR TOOTHPASTE   Do NOT smoke after Midnight   Take these medicines the morning of surgery with A SIP OF WATER:   Amlodipine  Atorvastatin  Bupropion  Metoprolol  Omeprazole  Okay to use inhalers    How to Manage Your Diabetes Before and After Surgery  Why is it important to control my blood sugar before and  after surgery? Improving blood sugar levels before and after surgery helps healing and can limit problems. A way of improving blood sugar control is eating a healthy diet by:  Eating less sugar and carbohydrates  Increasing activity/exercise  Talking with your doctor about reaching your blood sugar goals High blood sugars (greater than 180 mg/dL) can raise your risk of infections and slow your recovery, so you will need to focus on controlling your diabetes during the weeks before surgery. Make sure that the doctor who takes care of your diabetes knows about your planned surgery including the date and location.  How do I manage my blood sugar before surgery? Check your blood sugar at least 4 times a day, starting 2 days before surgery, to make sure that the level is not too high or low. Check your blood sugar the morning of your surgery when you wake up and every 2 hours until you get to the Short Stay unit. If your blood sugar is less than 70 mg/dL, you will need to treat for low blood sugar: Do not take insulin. Treat a low blood sugar (less than 70 mg/dL) with  cup of clear juice (cranberry or apple), 4 glucose tablets, OR glucose gel. Recheck blood sugar in 15 minutes after treatment (to make sure it is greater than 70 mg/dL). If your blood sugar is not greater than 70 mg/dL on recheck, call [phone] for further instructions. Report your blood sugar to the short stay nurse when you get to Short Stay.  If you are admitted to the hospital after surgery: Your blood sugar will be checked by the staff and you will probably be given  insulin after surgery (instead of oral diabetes medicines) to make sure you have good blood sugar levels. The goal for blood sugar control after surgery is 80-180 mg/dL.   WHAT DO I DO ABOUT MY DIABETES MEDICATION?  Do not take oral diabetes medicines (pills) the morning of surgery.        Hold Jardiance 3 days before surgery (do not take after 04-08-22)  THE NIGHT BEFORE SURGERY: Take 1/2 of Insulin Glargine at bedtime.      DO NOT TAKE THE FOLLOWING 7 DAYS PRIOR TO SURGERY: Ozempic, Wegovy, Rybelsus (Semaglutide), Byetta (exenatide), Bydureon (exenatide ER), Victoza, Saxenda (liraglutide), or Trulicity (dulaglutide) Mounjaro (Tirzepatide) Adlyxin (Lixisenatide), Polyethylene Glycol Loxenatide.  Reviewed and Endorsed by [facility] Patient Education Committee, August 2015   Hold Eliquis 3 days before surgery (do not take after 04-08-22)  Bring CPAP mask and tubing day of surgery.                              You may not have any metal on your body including hair pins, jewelry, and body piercing             Do not wear make-up, lotions, powders, perfumes or deodorant  Do not wear nail polish including gel and S&S, artificial/acrylic nails, or any other type of covering on natural nails including finger and toenails. If you have artificial nails, gel coating, etc. that needs to be removed by a nail salon please have this removed prior to surgery or surgery may need to be canceled/ delayed if the surgeon/ anesthesia feels like they are unable to be safely monitored.   Do not shave  48 hours prior to surgery.          Do not bring valuables to the hospital. [facility] IS NOT RESPONSIBLE   FOR VALUABLES.   Contacts, dentures or bridgework may not be worn into surgery.  DO NOT [place]. PHARMACY WILL DISPENSE MEDICATIONS LISTED ON YOUR MEDICATION LIST TO YOU DURING YOUR ADMISSION [place]!    Patients discharged on the day of surgery will not be  allowed to drive home.  Someone NEEDS to stay with you for the first 24 hours after anesthesia.   Special Instructions: Bring a copy of your healthcare power of attorney and living will documents the day of surgery if you haven't scanned them before.              Please read over the following fact sheets you were given: IF [place] [facility]  If you received a COVID test during your pre-op visit  it is requested that you wear a mask when out in public, stay away from anyone that may not be feeling well and notify your surgeon if you develop symptoms. If you test positive for Covid or have been in contact with anyone that has tested positive in the last 10 days please notify you surgeon.  [place] - Preparing for Surgery Before surgery, you can play an important role.  Because skin is not sterile, your skin needs to be as free of germs as possible.  You can reduce the number of germs on your skin by washing with CHG (chlorahexidine gluconate) soap before surgery.  CHG is an antiseptic cleaner which kills germs and bonds with the skin to continue killing germs even after washing. Please DO NOT use if you have an allergy to CHG or antibacterial soaps.  If your skin becomes reddened/irritated stop using the CHG and inform your nurse when you arrive at Short Stay. Do not shave (including legs and underarms) for at least 48 hours prior to the first CHG shower.  You may shave your face/neck.  Please follow these instructions carefully:  1.  Shower with CHG Soap the night before surgery and the  morning of surgery.  2.  If you choose to wash your hair, wash your hair first as usual with your normal  shampoo.  3.  After you shampoo, rinse your hair and body thoroughly to remove the shampoo.                             4.  Use CHG as you would any other liquid soap.  You can apply chg directly to the skin and wash.  Gently with a scrungie or  clean washcloth.  5.  Apply the CHG Soap to your body ONLY FROM THE NECK DOWN.   Do   not use on face/ open                           Wound or open sores. Avoid contact with eyes, ears mouth and   genitals (private parts).                       Wash face,  Genitals (private parts) with your normal soap.             6.  Wash thoroughly, paying special attention to the area where your    surgery  will be performed.  7.  Thoroughly rinse your body with warm water  from the neck down.  8.  DO NOT shower/wash with your normal soap after using and rinsing off the CHG Soap.                9.  Pat yourself dry with a clean towel.            10.  Wear clean pajamas.            11.  Place clean sheets on your bed the night of your first shower and do not  sleep with pets. Day of Surgery : Do not apply any lotions/deodorants the morning of surgery.  Please wear clean clothes to the hospital/surgery center.  FAILURE TO FOLLOW THESE INSTRUCTIONS MAY RESULT IN THE CANCELLATION OF YOUR SURGERY  PATIENT SIGNATURE_________________________________  NURSE SIGNATURE__________________________________  ________________________________________________________________________   WHAT IS A BLOOD TRANSFUSION? Blood Transfusion Information  A transfusion is the replacement of blood or some of its parts. Blood is made up of multiple cells which provide different functions. Red blood cells carry oxygen and are used for blood loss replacement. White blood cells fight against infection. Platelets control bleeding. Plasma helps clot blood. Other blood products are available for specialized needs, such as hemophilia or other clotting disorders. BEFORE THE TRANSFUSION  Who gives blood for transfusions?  Healthy volunteers who are fully evaluated to make sure their blood is safe. This is blood bank blood. Transfusion therapy is the safest it has ever been in the practice of medicine. Before blood is taken from a donor, a  complete history is taken to make sure that person has no history of diseases nor engages in risky social behavior (examples are intravenous drug use or sexual activity with multiple partners). The donor's travel history is screened to minimize risk of transmitting infections, such as malaria. The donated blood is tested for signs of infectious diseases, such as HIV and hepatitis. The blood is then tested to be sure it is compatible with you in order to minimize the chance of a transfusion reaction. If you or a relative donates blood, this is often done in anticipation of surgery and is not appropriate for emergency situations. It takes many days to process the donated blood. RISKS AND COMPLICATIONS Although transfusion therapy is very safe and saves many lives, the main dangers of transfusion include:  Getting an infectious disease. Developing a transfusion reaction. This is an allergic reaction to something in the blood you were given. Every precaution is taken to prevent this. The decision to have a blood transfusion has been considered carefully by your caregiver before blood is given. Blood is not given unless the benefits outweigh the risks. AFTER THE TRANSFUSION Right after receiving a blood transfusion, you will usually feel much better and more energetic. This is especially true if your red blood cells have gotten low (anemic). The transfusion raises the level of the red blood cells which carry oxygen, and this usually causes an energy increase. The nurse administering the transfusion will monitor you carefully for complications. HOME CARE INSTRUCTIONS  No special instructions are needed after a transfusion. You may find your energy is better. Speak with your caregiver about any limitations on activity for underlying diseases you may have. SEEK MEDICAL CARE IF:  Your condition is not improving after your transfusion. You develop redness or irritation at the intravenous (IV) site. SEEK  IMMEDIATE MEDICAL CARE IF:  Any of the following symptoms occur over the next 12 hours: Shaking chills. You have a temperature by mouth above  102 F (38.9 C), not controlled by medicine. Chest, back, or muscle pain. People around you feel you are not acting correctly or are confused. Shortness of breath or difficulty breathing. Dizziness and fainting. You get a rash or develop hives. You have a decrease in urine output. Your urine turns a dark color or changes to pink, red, or brown. Any of the following symptoms occur over the next 10 days: You have a temperature by mouth above 102 F (38.9 C), not controlled by medicine. Shortness of breath. Weakness after normal activity. The white part of the eye turns yellow (jaundice). You have a decrease in the amount of urine or are urinating less often. Your urine turns a dark color or changes to pink, red, or brown. Document Released: 02/12/2000 Document Revised: 05/09/2011 Document Reviewed: 10/01/2007 [facility] Patient Information 2014 [place], [place].  _______________________________________________________________________

## 2022-04-05 NOTE — Progress Notes (Addendum)
Spoke to patient to give her updated arrival time for her surgery on 04-12-22.  Patient's surgery was rescheduled due to abscess on nose, which she states has healed and antibiotics completed.  Patient was advised to arrive at 0945 day of surgery.  Reminded no solid food after midnight the night before surgery and that she can have clear liquids until 0900 the day of surgery.  Patient reminded to hold Eliquis and Jardiance 3 days before surgery.  Instructions on all diabetic medicines given.  Patient's husband will pick her up from surgery.  No other changes in medical history per patient.  All questions answered and patient stated understanding.  Copy of instructions mailed to patient.

## 2022-04-06 ENCOUNTER — Ambulatory Visit (INDEPENDENT_AMBULATORY_CARE_PROVIDER_SITE_OTHER): Payer: 59 | Admitting: Physician Assistant

## 2022-04-06 DIAGNOSIS — N3946 Mixed incontinence: Secondary | ICD-10-CM | POA: Diagnosis not present

## 2022-04-06 NOTE — Progress Notes (Signed)
PTNS  Session # 13 of 45  Health & Social Factors: no change Caffeine: 5 Alcohol: occasional not daily Daytime voids #per day: 4-5 Night-time voids #per night: 4-5 Urgency: mild Incontinence Episodes #per day: maybe 5 Ankle used: right Treatment Setting: 7 Feeling/ Response: sensory Comments: none  Performed By: Markus Jarvis  Follow Up: 07/11/22 with Dr McDiarmid

## 2022-04-11 ENCOUNTER — Telehealth: Payer: Self-pay

## 2022-04-11 ENCOUNTER — Encounter: Payer: 59 | Admitting: Psychiatry

## 2022-04-11 NOTE — Anesthesia Preprocedure Evaluation (Signed)
Anesthesia Evaluation  Patient identified by MRN, date of birth, ID band Patient awake    Reviewed: Allergy & Precautions, NPO status , Patient's Chart, lab work & pertinent test results, reviewed documented beta blocker date and time   History of Anesthesia Complications Negative for: history of anesthetic complications  Airway Mallampati: III  TM Distance: >3 FB Neck ROM: Full    Dental no notable dental hx.    Pulmonary asthma , sleep apnea and Continuous Positive Airway Pressure Ventilation , Patient abstained from smoking., former smoker   Pulmonary exam normal        Cardiovascular hypertension, Pt. on medications and Pt. on home beta blockers Normal cardiovascular exam+ dysrhythmias Atrial Fibrillation   TTE 11/2020: EF 55-60%, mild RAE/LAE, mild MR, mild AS    Neuro/Psych   Anxiety Depression       GI/Hepatic ,GERD  Medicated,,  Endo/Other  diabetes, Type 2, Oral Hypoglycemic Agents, Insulin Dependent    Renal/GU      Musculoskeletal   Abdominal   Peds  Hematology   Anesthesia Other Findings   Reproductive/Obstetrics ENDOMETRIAL CANCER                              Anesthesia Physical Anesthesia Plan  ASA: 3  Anesthesia Plan: General   Post-op Pain Management: Tylenol PO (pre-op)* and Ketamine IV*   Induction: Intravenous  PONV Risk Score and Plan: 4 or greater and Treatment may vary due to age or medical condition, Midazolam, Dexamethasone, Ondansetron, Propofol infusion and Scopolamine patch - Pre-op  Airway Management Planned: Oral ETT  Additional Equipment:   Intra-op Plan:   Post-operative Plan: Extubation in OR  Informed Consent: I have reviewed the patients History and Physical, chart, labs and discussed the procedure including the risks, benefits and alternatives for the proposed anesthesia with the patient or authorized representative who has indicated his/her  understanding and acceptance.     Dental advisory given  Plan Discussed with: CRNA  Anesthesia Plan Comments:         Anesthesia Quick Evaluation

## 2022-04-11 NOTE — Telephone Encounter (Signed)
LM for Ms Joan Graves to call the office by 4 pm today to review pre-op instructions.

## 2022-04-11 NOTE — Telephone Encounter (Signed)
Ms Marden states that she took her last dose of eliquis and jardiance on 04-08-22. Patient compliant with pre-operative instructions.  Reinforced nothing to eat after midnight. Clear liquids until 0900. Patient to arrive at 0945 .  Ms Griffin-Harris asked if her purse would be locked up. Told her that as written on her Pre-op instructions that [facility] is not responsible for valuables. Please do not bring valuables to the hospital.

## 2022-04-11 NOTE — Telephone Encounter (Signed)
LM for Joan Graves to call back to the office at [phone] to review pre-op instructions for surgery 04-12-22.

## 2022-04-12 ENCOUNTER — Encounter (HOSPITAL_COMMUNITY): Payer: Self-pay | Admitting: Psychiatry

## 2022-04-12 ENCOUNTER — Other Ambulatory Visit: Payer: Self-pay

## 2022-04-12 ENCOUNTER — Ambulatory Visit (HOSPITAL_COMMUNITY)
Admission: RE | Admit: 2022-04-12 | Discharge: 2022-04-12 | Disposition: A | Discharge status: Home / Self Care | Payer: 59 | Attending: Psychiatry | Admitting: Psychiatry

## 2022-04-12 ENCOUNTER — Encounter (HOSPITAL_COMMUNITY)
Admission: RE | Disposition: A | Discharge status: Home / Self Care | Payer: Self-pay | Source: Home / Self Care | Attending: Psychiatry

## 2022-04-12 ENCOUNTER — Ambulatory Visit (HOSPITAL_BASED_OUTPATIENT_CLINIC_OR_DEPARTMENT_OTHER): Payer: 59 | Admitting: Physician Assistant

## 2022-04-12 ENCOUNTER — Ambulatory Visit (HOSPITAL_COMMUNITY): Payer: 59 | Admitting: Physician Assistant

## 2022-04-12 ENCOUNTER — Ambulatory Visit: Payer: 59 | Admitting: Gastroenterology

## 2022-04-12 DIAGNOSIS — Z87891 Personal history of nicotine dependence: Secondary | ICD-10-CM | POA: Diagnosis not present

## 2022-04-12 DIAGNOSIS — C541 Malignant neoplasm of endometrium: Secondary | ICD-10-CM | POA: Diagnosis not present

## 2022-04-12 DIAGNOSIS — E119 Type 2 diabetes mellitus without complications: Secondary | ICD-10-CM | POA: Insufficient documentation

## 2022-04-12 DIAGNOSIS — Z9989 Dependence on other enabling machines and devices: Secondary | ICD-10-CM

## 2022-04-12 DIAGNOSIS — I4891 Unspecified atrial fibrillation: Secondary | ICD-10-CM | POA: Diagnosis not present

## 2022-04-12 DIAGNOSIS — G4733 Obstructive sleep apnea (adult) (pediatric): Secondary | ICD-10-CM

## 2022-04-12 DIAGNOSIS — Z794 Long term (current) use of insulin: Secondary | ICD-10-CM | POA: Insufficient documentation

## 2022-04-12 DIAGNOSIS — N7011 Chronic salpingitis: Secondary | ICD-10-CM | POA: Insufficient documentation

## 2022-04-12 DIAGNOSIS — Z6841 Body Mass Index (BMI) 40.0 and over, adult: Secondary | ICD-10-CM | POA: Insufficient documentation

## 2022-04-12 DIAGNOSIS — K219 Gastro-esophageal reflux disease without esophagitis: Secondary | ICD-10-CM | POA: Insufficient documentation

## 2022-04-12 DIAGNOSIS — G4730 Sleep apnea, unspecified: Secondary | ICD-10-CM | POA: Diagnosis not present

## 2022-04-12 DIAGNOSIS — F418 Other specified anxiety disorders: Secondary | ICD-10-CM | POA: Diagnosis not present

## 2022-04-12 DIAGNOSIS — Z09 Encounter for follow-up examination after completed treatment for conditions other than malignant neoplasm: Secondary | ICD-10-CM | POA: Insufficient documentation

## 2022-04-12 DIAGNOSIS — J45909 Unspecified asthma, uncomplicated: Secondary | ICD-10-CM | POA: Diagnosis not present

## 2022-04-12 DIAGNOSIS — Z01818 Encounter for other preprocedural examination: Secondary | ICD-10-CM

## 2022-04-12 DIAGNOSIS — Z7984 Long term (current) use of oral hypoglycemic drugs: Secondary | ICD-10-CM | POA: Insufficient documentation

## 2022-04-12 DIAGNOSIS — I10 Essential (primary) hypertension: Secondary | ICD-10-CM

## 2022-04-12 HISTORY — PX: ROBOTIC ASSISTED TOTAL HYSTERECTOMY WITH BILATERAL SALPINGO OOPHERECTOMY: SHX6086

## 2022-04-12 HISTORY — PX: SENTINEL NODE BIOPSY: SHX6608

## 2022-04-12 LAB — CBC
HCT: 46.1 % — ABNORMAL HIGH (ref 36.0–46.0)
Hemoglobin: 13.7 g/dL (ref 12.0–15.0)
MCH: 27 pg (ref 26.0–34.0)
MCHC: 29.7 g/dL — ABNORMAL LOW (ref 30.0–36.0)
MCV: 90.9 fL (ref 80.0–100.0)
Platelets: 248 10*3/uL (ref 150–400)
RBC: 5.07 MIL/uL (ref 3.87–5.11)
RDW: 14.6 % (ref 11.5–15.5)
WBC: 6 10*3/uL (ref 4.0–10.5)
nRBC: 0 % (ref 0.0–0.2)

## 2022-04-12 LAB — GLUCOSE, CAPILLARY: Glucose-Capillary: 121 mg/dL — ABNORMAL HIGH (ref 70–99)

## 2022-04-12 LAB — BASIC METABOLIC PANEL
Anion gap: 9 (ref 5–15)
BUN: 11 mg/dL (ref 6–20)
CO2: 21 mmol/L — ABNORMAL LOW (ref 22–32)
Calcium: 9 mg/dL (ref 8.9–10.3)
Chloride: 110 mmol/L (ref 98–111)
Creatinine, Ser: 0.53 mg/dL (ref 0.44–1.00)
GFR, Estimated: >60 mL/min (ref >60)
Glucose, Bld: 143 mg/dL — ABNORMAL HIGH (ref 70–99)
Potassium: 4.5 mmol/L (ref 3.5–5.1)
Sodium: 140 mmol/L (ref 135–145)

## 2022-04-12 LAB — TYPE AND SCREEN
ABO/RH(D): O POS
Antibody Screen: NEGATIVE

## 2022-04-12 SURGERY — HYSTERECTOMY, TOTAL, ROBOT-ASSISTED, LAPAROSCOPIC, WITH BILATERAL SALPINGO-OOPHORECTOMY
Anesthesia: General

## 2022-04-12 MED ORDER — CELECOXIB 200 MG PO CAPS
200.0000 mg | ORAL_CAPSULE | ORAL | Status: AC
  Administered 2022-04-12: 200 mg via ORAL
  Filled 2022-04-12: qty 1

## 2022-04-12 MED ORDER — HYDROMORPHONE HCL 1 MG/ML IJ SOLN
INTRAMUSCULAR | Status: AC
  Filled 2022-04-12: qty 1

## 2022-04-12 MED ORDER — SUGAMMADEX SODIUM 500 MG/5ML IV SOLN
INTRAVENOUS | Status: DC | PRN
  Administered 2022-04-12: 350 mg via INTRAVENOUS

## 2022-04-12 MED ORDER — PROPOFOL 10 MG/ML IV BOLUS
INTRAVENOUS | Status: DC | PRN
  Administered 2022-04-12: 75 ug/kg/min via INTRAVENOUS
  Administered 2022-04-12: 200 mg via INTRAVENOUS

## 2022-04-12 MED ORDER — NYSTATIN 100000 UNIT/GM EX POWD: 1.0000 | Freq: Three times a day (TID) | CUTANEOUS | 0 refills | Status: AC

## 2022-04-12 MED ORDER — CHLORHEXIDINE GLUCONATE 0.12 % MT SOLN
15.0000 mL | Freq: Once | OROMUCOSAL | Status: AC
  Administered 2022-04-12: 15 mL via OROMUCOSAL

## 2022-04-12 MED ORDER — FENTANYL CITRATE (PF) 250 MCG/5ML IJ SOLN
INTRAMUSCULAR | Status: AC
  Filled 2022-04-12: qty 5

## 2022-04-12 MED ORDER — KETAMINE HCL 50 MG/5ML IJ SOSY
PREFILLED_SYRINGE | INTRAMUSCULAR | Status: AC
  Filled 2022-04-12: qty 5

## 2022-04-12 MED ORDER — GABAPENTIN 300 MG PO CAPS
300.0000 mg | ORAL_CAPSULE | ORAL | Status: AC
  Administered 2022-04-12: 300 mg via ORAL
  Filled 2022-04-12: qty 1

## 2022-04-12 MED ORDER — OXYCODONE HCL 5 MG PO TABS
ORAL_TABLET | ORAL | Status: AC
  Filled 2022-04-12: qty 1

## 2022-04-12 MED ORDER — SUCCINYLCHOLINE CHLORIDE 200 MG/10ML IV SOSY
PREFILLED_SYRINGE | INTRAVENOUS | Status: AC
  Filled 2022-04-12: qty 10

## 2022-04-12 MED ORDER — DEXAMETHASONE SODIUM PHOSPHATE 4 MG/ML IJ SOLN: 4.0000 mg | INTRAMUSCULAR | Status: DC

## 2022-04-12 MED ORDER — ORAL CARE MOUTH RINSE: 15.0000 mL | Freq: Once | OROMUCOSAL | Status: AC

## 2022-04-12 MED ORDER — HYDROMORPHONE HCL 1 MG/ML IJ SOLN
0.2500 mg | INTRAMUSCULAR | Status: DC | PRN
  Administered 2022-04-12: 0.25 mg via INTRAVENOUS
  Administered 2022-04-12: 0.5 mg via INTRAVENOUS
  Administered 2022-04-12: 0.25 mg via INTRAVENOUS

## 2022-04-12 MED ORDER — ROCURONIUM BROMIDE 10 MG/ML (PF) SYRINGE
PREFILLED_SYRINGE | INTRAVENOUS | Status: AC
  Filled 2022-04-12: qty 10

## 2022-04-12 MED ORDER — AMISULPRIDE (ANTIEMETIC) 5 MG/2ML IV SOLN: 10.0000 mg | Freq: Once | INTRAVENOUS | Status: DC | PRN

## 2022-04-12 MED ORDER — SUGAMMADEX SODIUM 500 MG/5ML IV SOLN
INTRAVENOUS | Status: AC
  Filled 2022-04-12: qty 5

## 2022-04-12 MED ORDER — STERILE WATER FOR INJECTION IJ SOLN
INTRAMUSCULAR | Status: DC | PRN
  Administered 2022-04-12: 10 mL

## 2022-04-12 MED ORDER — OXYCODONE HCL 5 MG/5ML PO SOLN: 5.0000 mg | Freq: Once | ORAL | Status: AC | PRN

## 2022-04-12 MED ORDER — MIDAZOLAM HCL 5 MG/5ML IJ SOLN
INTRAMUSCULAR | Status: DC | PRN
  Administered 2022-04-12: 2 mg via INTRAVENOUS

## 2022-04-12 MED ORDER — SUCCINYLCHOLINE CHLORIDE 200 MG/10ML IV SOSY
PREFILLED_SYRINGE | INTRAVENOUS | Status: DC | PRN
  Administered 2022-04-12: 140 mg via INTRAVENOUS

## 2022-04-12 MED ORDER — OXYCODONE HCL 5 MG PO TABS
5.0000 mg | ORAL_TABLET | Freq: Once | ORAL | Status: AC | PRN
  Administered 2022-04-12: 5 mg via ORAL

## 2022-04-12 MED ORDER — 0.9 % SODIUM CHLORIDE (POUR BTL) OPTIME
TOPICAL | Status: DC | PRN
  Administered 2022-04-12: 1000 mL

## 2022-04-12 MED ORDER — BUPIVACAINE HCL 0.25 % IJ SOLN
INTRAMUSCULAR | Status: AC
  Filled 2022-04-12: qty 1

## 2022-04-12 MED ORDER — GLYCOPYRROLATE PF 0.2 MG/ML IJ SOSY
PREFILLED_SYRINGE | INTRAMUSCULAR | Status: DC | PRN
  Administered 2022-04-12: .2 mg via INTRAVENOUS

## 2022-04-12 MED ORDER — STERILE WATER FOR INJECTION IJ SOLN
INTRAMUSCULAR | Status: DC | PRN
  Administered 2022-04-12: 4 mL

## 2022-04-12 MED ORDER — ACETAMINOPHEN 500 MG PO TABS
1000.0000 mg | ORAL_TABLET | Freq: Once | ORAL | Status: AC
  Administered 2022-04-12: 1000 mg via ORAL
  Filled 2022-04-12: qty 2

## 2022-04-12 MED ORDER — PROPOFOL 500 MG/50ML IV EMUL
INTRAVENOUS | Status: AC
  Filled 2022-04-12: qty 50

## 2022-04-12 MED ORDER — ROCURONIUM BROMIDE 10 MG/ML (PF) SYRINGE
PREFILLED_SYRINGE | INTRAVENOUS | Status: DC | PRN
  Administered 2022-04-12: 20 mg via INTRAVENOUS
  Administered 2022-04-12: 50 mg via INTRAVENOUS
  Administered 2022-04-12: 20 mg via INTRAVENOUS
  Administered 2022-04-12: 80 mg via INTRAVENOUS

## 2022-04-12 MED ORDER — SODIUM CHLORIDE (PF) 0.9 % IJ SOLN
INTRAMUSCULAR | Status: AC
  Filled 2022-04-12: qty 10

## 2022-04-12 MED ORDER — STERILE WATER FOR INJECTION IJ SOLN
INTRAMUSCULAR | Status: AC
  Filled 2022-04-12: qty 10

## 2022-04-12 MED ORDER — DEXAMETHASONE SODIUM PHOSPHATE 10 MG/ML IJ SOLN
INTRAMUSCULAR | Status: DC | PRN
  Administered 2022-04-12: 4 mg via INTRAVENOUS

## 2022-04-12 MED ORDER — SCOPOLAMINE 1 MG/3DAYS TD PT72: 1.0000 | MEDICATED_PATCH | TRANSDERMAL | Status: DC

## 2022-04-12 MED ORDER — LEVONORGESTREL 20 MCG/DAY IU IUD
1.0000 | INTRAUTERINE_SYSTEM | INTRAUTERINE | Status: DC
  Filled 2022-04-12: qty 1

## 2022-04-12 MED ORDER — ONDANSETRON HCL 4 MG/2ML IJ SOLN
INTRAMUSCULAR | Status: DC | PRN
  Administered 2022-04-12: 4 mg via INTRAVENOUS

## 2022-04-12 MED ORDER — FENTANYL CITRATE (PF) 100 MCG/2ML IJ SOLN
INTRAMUSCULAR | Status: DC | PRN
  Administered 2022-04-12: 50 ug via INTRAVENOUS
  Administered 2022-04-12: 100 ug via INTRAVENOUS
  Administered 2022-04-12: 50 ug via INTRAVENOUS

## 2022-04-12 MED ORDER — EPHEDRINE SULFATE-NACL 50-0.9 MG/10ML-% IV SOSY
PREFILLED_SYRINGE | INTRAVENOUS | Status: DC | PRN
  Administered 2022-04-12: 15 mg via INTRAVENOUS

## 2022-04-12 MED ORDER — PHENYLEPHRINE HCL-NACL 20-0.9 MG/250ML-% IV SOLN
INTRAVENOUS | Status: DC | PRN
  Administered 2022-04-12: 45 ug/min via INTRAVENOUS

## 2022-04-12 MED ORDER — KETAMINE HCL 10 MG/ML IJ SOLN
INTRAMUSCULAR | Status: DC | PRN
  Administered 2022-04-12 (×2): 20 mg via INTRAVENOUS

## 2022-04-12 MED ORDER — CEFAZOLIN IN SODIUM CHLORIDE 3-0.9 GM/100ML-% IV SOLN
3.0000 g | INTRAVENOUS | Status: AC
  Administered 2022-04-12: 2 g via INTRAVENOUS
  Administered 2022-04-12: 1 g via INTRAVENOUS
  Filled 2022-04-12: qty 100

## 2022-04-12 MED ORDER — FLUCONAZOLE IN SODIUM CHLORIDE 400-0.9 MG/200ML-% IV SOLN
800.0000 mg | Freq: Once | INTRAVENOUS | Status: AC
  Administered 2022-04-12: 800 mg via INTRAVENOUS
  Filled 2022-04-12: qty 400

## 2022-04-12 MED ORDER — BUPIVACAINE HCL 0.25 % IJ SOLN
INTRAMUSCULAR | Status: DC | PRN
  Administered 2022-04-12: 33 mL

## 2022-04-12 MED ORDER — LIDOCAINE 2% (20 MG/ML) 5 ML SYRINGE
INTRAMUSCULAR | Status: DC | PRN
  Administered 2022-04-12: 60 mg via INTRAVENOUS

## 2022-04-12 MED ORDER — LACTATED RINGERS IR SOLN
Status: DC | PRN
  Administered 2022-04-12: 1000 mL

## 2022-04-12 MED ORDER — MIDAZOLAM HCL 2 MG/2ML IJ SOLN
INTRAMUSCULAR | Status: AC
  Filled 2022-04-12: qty 2

## 2022-04-12 MED ORDER — SCOPOLAMINE 1 MG/3DAYS TD PT72
1.0000 | MEDICATED_PATCH | Freq: Once | TRANSDERMAL | Status: DC
  Administered 2022-04-12: 1.5 mg via TRANSDERMAL
  Filled 2022-04-12: qty 1

## 2022-04-12 MED ORDER — LACTATED RINGERS IV SOLN: INTRAVENOUS | Status: DC

## 2022-04-12 MED ORDER — CEFAZOLIN SODIUM 1 G IJ SOLR
INTRAMUSCULAR | Status: AC
  Filled 2022-04-12: qty 10

## 2022-04-12 MED ORDER — EPHEDRINE 5 MG/ML INJ
INTRAVENOUS | Status: AC
  Filled 2022-04-12: qty 5

## 2022-04-12 MED ORDER — PROPOFOL 1000 MG/100ML IV EMUL
INTRAVENOUS | Status: AC
  Filled 2022-04-12: qty 100

## 2022-04-12 MED ORDER — LACTATED RINGERS IV SOLN: INTRAVENOUS | Status: DC | PRN

## 2022-04-12 MED ORDER — ACETAMINOPHEN 500 MG PO TABS: 1000.0000 mg | ORAL_TABLET | ORAL | Status: DC

## 2022-04-12 MED ORDER — HEPARIN SODIUM (PORCINE) 5000 UNIT/ML IJ SOLN
5000.0000 [IU] | INTRAMUSCULAR | Status: AC
  Administered 2022-04-12: 5000 [IU] via SUBCUTANEOUS
  Filled 2022-04-12: qty 1

## 2022-04-12 SURGICAL SUPPLY — 79 items
APPLICATOR SURGIFLO ENDO (HEMOSTASIS) IMPLANT
BAG LAPAROSCOPIC 12 15 PORT 16 (BASKET) IMPLANT
BAG RETRIEVAL 12/15 (BASKET)
BLADE SURG SZ10 CARB STEEL (BLADE) IMPLANT
COVER BACK TABLE 60X90IN (DRAPES) ×2 IMPLANT
COVER TIP SHEARS 8 DVNC (MISCELLANEOUS) ×2 IMPLANT
COVER TIP SHEARS 8MM DA VINCI (MISCELLANEOUS) ×2
DERMABOND ADVANCED .7 DNX12 (GAUZE/BANDAGES/DRESSINGS) ×2 IMPLANT
DRAPE ARM DVNC X/XI (DISPOSABLE) ×8 IMPLANT
DRAPE COLUMN DVNC XI (DISPOSABLE) ×2 IMPLANT
DRAPE DA VINCI XI ARM (DISPOSABLE) ×8
DRAPE DA VINCI XI COLUMN (DISPOSABLE) ×2
DRAPE SHEET LG 3/4 BI-LAMINATE (DRAPES) ×2 IMPLANT
DRAPE SURG IRRIG POUCH 19X23 (DRAPES) ×2 IMPLANT
DRSG OPSITE POSTOP 4X6 (GAUZE/BANDAGES/DRESSINGS) IMPLANT
DRSG OPSITE POSTOP 4X8 (GAUZE/BANDAGES/DRESSINGS) IMPLANT
ELECT PENCIL ROCKER SW 15FT (MISCELLANEOUS) IMPLANT
ELECT REM PT RETURN 15FT ADLT (MISCELLANEOUS) ×2 IMPLANT
GAUZE 4X4 16PLY ~~LOC~~+RFID DBL (SPONGE) ×2 IMPLANT
GLOVE BIO SURGEON STRL SZ 6 (GLOVE) ×8 IMPLANT
GLOVE BIO SURGEON STRL SZ 6.5 (GLOVE) ×2 IMPLANT
GLOVE BIOGEL PI IND STRL 6.5 (GLOVE) ×4 IMPLANT
GOWN STRL REUS W/ TWL LRG LVL3 (GOWN DISPOSABLE) ×8 IMPLANT
GOWN STRL REUS W/TWL LRG LVL3 (GOWN DISPOSABLE) ×10
GRASPER SUT TROCAR 14GX15 (MISCELLANEOUS) IMPLANT
HOLDER FOLEY CATH W/STRAP (MISCELLANEOUS) IMPLANT
IRRIG SUCT STRYKERFLOW 2 WTIP (MISCELLANEOUS) ×2
IRRIGATION SUCT STRKRFLW 2 WTP (MISCELLANEOUS) ×2 IMPLANT
KIT PROCEDURE DA VINCI SI (MISCELLANEOUS) ×2
KIT PROCEDURE DVNC SI (MISCELLANEOUS) IMPLANT
KIT TURNOVER KIT A (KITS) IMPLANT
LIGASURE BLUNT TIP 5 LONG 44CM (ELECTROSURGICAL) IMPLANT
LIGASURE IMPACT 36 18CM CVD LR (INSTRUMENTS) IMPLANT
MANIPULATOR ADVINCU DEL 3.0 PL (MISCELLANEOUS) IMPLANT
MANIPULATOR ADVINCU DEL 3.5 PL (MISCELLANEOUS) IMPLANT
MANIPULATOR UTERINE 4.5 ZUMI (MISCELLANEOUS) IMPLANT
NDL HYPO 21X1.5 SAFETY (NEEDLE) ×2 IMPLANT
NDL INSUFFLATION 14GA 120MM (NEEDLE) IMPLANT
NDL SPNL 18GX3.5 QUINCKE PK (NEEDLE) IMPLANT
NEEDLE HYPO 21X1.5 SAFETY (NEEDLE) ×2 IMPLANT
NEEDLE INSUFFLATION 14GA 120MM (NEEDLE) IMPLANT
NEEDLE SPNL 18GX3.5 QUINCKE PK (NEEDLE) ×2 IMPLANT
OBTURATOR OPTICAL STANDARD 8MM (TROCAR) ×2
OBTURATOR OPTICAL STND 8 DVNC (TROCAR) ×2
OBTURATOR OPTICALSTD 8 DVNC (TROCAR) ×2 IMPLANT
PACK ROBOT GYN CUSTOM WL (TRAY / TRAY PROCEDURE) ×2 IMPLANT
PAD ARMBOARD 7.5X6 YLW CONV (MISCELLANEOUS) ×2 IMPLANT
PAD POSITIONING PINK XL (MISCELLANEOUS) ×2 IMPLANT
PORT ACCESS TROCAR AIRSEAL 12 (TROCAR) IMPLANT
SEAL CANN UNIV 5-8 DVNC XI (MISCELLANEOUS) ×8 IMPLANT
SEAL XI 5MM-8MM UNIVERSAL (MISCELLANEOUS) ×8
SET TRI-LUMEN FLTR TB AIRSEAL (TUBING) ×2 IMPLANT
SOL PREP POV-IOD 4OZ 10% (MISCELLANEOUS) ×4 IMPLANT
SPIKE FLUID TRANSFER (MISCELLANEOUS) ×2 IMPLANT
SPONGE T-LAP 18X18 ~~LOC~~+RFID (SPONGE) IMPLANT
STAPLER SKIN PROX 35W (STAPLE) IMPLANT
SURGIFLO W/THROMBIN 8M KIT (HEMOSTASIS) IMPLANT
SUT MNCRL AB 4-0 PS2 18 (SUTURE) IMPLANT
SUT PDS AB 1 TP1 96 (SUTURE) IMPLANT
SUT VIC AB 0 CT1 27 (SUTURE) ×2
SUT VIC AB 0 CT1 27XBRD ANTBC (SUTURE) IMPLANT
SUT VIC AB 2-0 CT1 27 (SUTURE)
SUT VIC AB 2-0 CT1 TAPERPNT 27 (SUTURE) IMPLANT
SUT VIC AB 4-0 PS2 18 (SUTURE) ×4 IMPLANT
SUT VLOC 180 0 9IN  GS21 (SUTURE)
SUT VLOC 180 0 9IN GS21 (SUTURE) IMPLANT
SYR 10ML LL (SYRINGE) IMPLANT
SYR BULB IRRIG 60ML STRL (SYRINGE) IMPLANT
SYS BAG RETRIEVAL 10MM (BASKET)
SYS WOUND ALEXIS 18CM MED (MISCELLANEOUS)
SYSTEM BAG RETRIEVAL 10MM (BASKET) IMPLANT
SYSTEM WOUND ALEXIS 18CM MED (MISCELLANEOUS) IMPLANT
TOWEL OR NON WOVEN STRL DISP B (DISPOSABLE) IMPLANT
TRAP SPECIMEN MUCUS 40CC (MISCELLANEOUS) IMPLANT
TRAY FOLEY MTR SLVR 16FR STAT (SET/KITS/TRAYS/PACK) ×2 IMPLANT
TROCAR PORT AIRSEAL 5X120 (TROCAR) IMPLANT
UNDERPAD 30X36 HEAVY ABSORB (UNDERPADS AND DIAPERS) ×4 IMPLANT
WATER STERILE IRR 1000ML POUR (IV SOLUTION) ×2 IMPLANT
YANKAUER SUCT BULB TIP 10FT TU (MISCELLANEOUS) IMPLANT

## 2022-04-12 NOTE — Discharge Instructions (Addendum)
AFTER SURGERY INSTRUCTIONS (these may change based on procedure performed)   Return to work: 4-6 weeks if applicable   Activity: 1. Be up and out of the bed during the day.  Take a nap if needed.  You may walk up steps but be careful and use the hand rail.  Stair climbing will tire you more than you think, you may need to stop part way and rest.    2. No lifting or straining for 6 weeks over 10 pounds. No pushing, pulling, straining for 6 weeks.   3. No driving for around 1 week(s).  Do not drive if you are taking narcotic pain medicine and make sure that your reaction time has returned.    4. You can shower as soon as the next day after surgery. Shower daily.  Use your regular soap and water (not directly on the incision) and pat your incision(s) dry afterwards; don't rub.  No tub baths or submerging your body in water until cleared by your surgeon. If you have the soap that was given to you by pre-surgical testing that was used before surgery, you do not need to use it afterwards because this can irritate your incisions.    5. No sexual activity and nothing in the vagina for 12 weeks.   6. You may experience a small amount of clear drainage from your incisions, which is normal.  If the drainage persists, increases, or changes color please call the office.   7. Do not use creams, lotions, or ointments such as neosporin on your incisions after surgery until advised by your surgeon because they can cause removal of the dermabond glue on your incisions.     8. You may experience vaginal spotting after surgery or around the 6-8 week mark from surgery when the stitches at the top of the vagina begin to dissolve.  The spotting is normal but if you experience heavy bleeding, call our office.   9. Take Tylenol first for pain if you are able to take these medications and only use narcotic pain medication for severe pain not relieved by the Tylenol.  Monitor your Tylenol intake to a max of 4,000 mg in a  24 hour period.   10.  Can resume Eliquis on 24 hours after surgery   Diet: 1. Low sodium Heart Healthy Diet is recommended but you are cleared to resume your normal (before surgery) diet after your procedure.   2. It is safe to use a laxative, such as Miralax or Colace, if you have difficulty moving your bowels. You have been prescribed Sennakot-S to take at bedtime every evening after surgery to keep bowel movements regular and to prevent constipation.     Wound Care: 1. Keep clean and dry.  Shower daily.   Reasons to call the Doctor: Fever - Oral temperature greater than 100.4 degrees Fahrenheit Foul-smelling vaginal discharge Difficulty urinating Nausea and vomiting Increased pain at the site of the incision that is unrelieved with pain medicine. Difficulty breathing with or without chest pain New calf pain especially if only on one side Sudden, continuing increased vaginal bleeding with or without clots.   Contacts: For questions or concerns you should contact:   Dr. Bernadene Bell at [place], NP at [phone]   After Hours: call [phone] and have the GYN Oncologist paged/contacted (after 5 pm or on the weekends).   Messages sent via mychart are for non-urgent matters and are not responded to after hours so for  urgent needs, please call the after hours number.

## 2022-04-12 NOTE — Op Note (Signed)
GYNECOLOGIC ONCOLOGY OPERATIVE NOTE  Date of Service: 04/12/2022  Preoperative Diagnosis: FIGO grade 2 endometrial adenocarcinoma, morbid obesity (BMI 55)  Postoperative Diagnosis: Same  Procedures: Robotic-assisted total laparoscopic hysterectomy, bilateral salpingo-oophorectomy, bilateral sentinel lymph node evaluation (Modifer 22: extreme morbid obesity, BMI 55, with significant retroperitoneal and intraperitoneal adiposity requiring additional OR personnel for positioning and retraction. Obesity made retroperitoneal visualization limited, increasing the complexity of the case and necessitating additional instrumentation for retraction and to create safe exposure. Obesity related complexity increased the duration of the procedure by 45 minutes.)    Surgeon: Bernadene Bell, MD  Assistants: Lahoma Crocker, MD and (an MD assistant was necessary for tissue manipulation, management of robotic instrumentation, retraction and positioning due to the complexity of the case and hospital policies)  Anesthesia: General  Estimated Blood Loss: 100 mL    Fluids: 400 ml, crystalloid  Urine Output: 150 ml, clear yellow  Findings: Significant intertrigo under bilateral breasts and in bilateral groins, under pannus and Upper abdominal survey with enlarged appearing  liver with normal liver surface and diaphragm. Normal appearing small and large bowel. Omentum adherent to the anterior abdominal wall in an umbilical hernia and an inferior anterior abdominal wall hernia. Normal uterus. Bilateral fallopian tubes with apparent prior tubal ligation. Normal ovaries. One adhesion of small bowel to small bowel noted without stricture. Significant subcutaneous and visceral adiposity limiting visualizatio during case, particularly in the retroperitoneum. Bilateral sentinel lymph node injection performed. Mapping not well visualized due to retroperitoneal adiposity. Decision made to defer sentinel lymph node biopsy  as further dissection would have been unsafe.    Specimens:  ID Type Source Tests Collected by Time Destination  1 : Uterus, cervix, bilateral tubes and ovaries Tissue PATH Gyn tumor resection SURGICAL PATHOLOGY Bernadene Bell, MD [id number]     Complications:  None  Indications for Procedure: Joan Graves is a 52 y.o. woman with FIGO grade 2 endometrioid endometrial cancer.  Prior to the procedure, all risks, benefits, and alternatives were discussed and informed surgical consent was signed.  Procedure: Patient was taken to the operating room where general anesthesia was achieved.  She was positioned in dorsal lithotomy. Her panus was taped inferiorly to her anterior thighs. This was covered with [place]. She was then prepped and draped.  A foley catheter was inserted into the bladder. 1 ml of dilute Indo-Cyanine dye was was injected at 1cm and 71m deep at 3 and 9 o'clock in the cervical stroma.  The cervix was dilated and an Advincula uterine manipulator with a colpotomy ring was inserted into the uterus.  A 12 mm incision was made in the left upper quadrant near Palmer's point.  The abdomen was entered with a 5 mm OptiView trocar under direct visualization.  The abdomen was insufflated, the patient placed in steep Trendelenburg, and additional trocars were placed as follows: two 8 mm robotic trocars in the right abdomen, and one 8 mm robotic trocar in the left abdomen.  The left upper quadrant trocar was removed and replaced with a 12 mm airseal trocar. Using the laparoscopic ligasure device and traction, the omentum adherent to the anterior abdominal wall in the two hernias was cauterized and transect. An 860mtrocar was then placed superior to the umbilicus. All trocars were placed under direct visualization.  The bowels were moved into the upper abdomen.  The DaVinci robotic surgical system was brought to the patient's bedside and docked.  The right round ligament was transected  and the retroperitoneum entered. With  extensive dissection due to significant retroperitoneal adiposity, the right ureter was identified. The paravesical and pararectal spaces were opened. Due to the adiposity, the channels were not clearly visible and so decision made to defer sentinel lymph node biopsy given risk of harm due to poor visualization.A similar procedure was performed on left.   The left ureter was again identified, and the left infundibulopelvic ligament was isolated, cauterized, and transected. The posterior peritoneum was opened to the colpotomy ring. The anterior peritoneum was opened and the bladder flap was created. The left uterine artery was skeletonized, cauterized, and transected at the level of the colpotomy ring. Additional cautery was used in a C-shaped fashion to allow the remainder of the broad, cardinal, and uterosacral ligaments with the uterine vessels to be transected and fall away from the colpotomy ring.  A similar procedure was performed on the contralateral side. A colpotomy was made circumferentially following the contours of the colpotomy ring.  The uterine specimen was removed through the vagina.    The vaginal cuff was closed with a running stitch of 0 Vicryl suture.  The pelvis was irrigated and all operative sites were found to be hemostatic. The omentum was reinspected and made hemostatic with bipolar cautery. All instruments were removed and the robot was taken from the patient's bedside. The fascia at the 12 mm incision was closed with 0 Vicryl using a PMI device. The abdomen was desufflated and all ports were removed. The skin at all incisions was closed with 4-0 Vicryl to reapproximate the subcutaneous tissue and 4-0 monocryl in a subcuticular fashion followed by surgical glue.  Patient tolerated the procedure well. Sponge, lap, and instrument counts were correct.  Patient received 3 gm of Ancef prior to skin incision for routine perioperative antibiotic  prophylaxis.  She was extubated and taken to the PACU in stable condition.  Bernadene Bell, MD Gynecologic Oncology

## 2022-04-12 NOTE — H&P (Signed)
Brief Pre-operative History & Physical  Patient name: Joan Graves CSN: [id number] MRN: [id number] [place] Date: 04/12/2022 Date of Surgery: 04/12/2022 Performing Service: Gynecology   Code Status: Full Code    Assessment & Plan    Joan Graves is a 52 y.o. female with ENDOMETRIAL CANCER, who presents for: Procedure(s) (LRB): XI ROBOTIC ASSISTED TOTAL HYSTERECTOMY WITH BILATERAL SALPINGO OOPHORECTOMY (Bilateral) SENTINEL NODE BIOPSY (N/A) POSSIBLE LYMPH NODE DISSECTION (N/A) POSSIBLE DILATATION AND CURETTAGE (N/A) POSSIBLE INTRAUTERINE DEVICE (IUD) INSERTION (N/A).   Consent obtained in office is accurate. Risks, benefits, and alternatives to surgery were reviewed, and all questions were answered.  Proceed to the OR as planned.     History of Present Illness:  Joan Graves is a 52 y.o. female with ENDOMETRIAL CANCER. She was recently seen in clinic, where a detailed HPI can be found. She was noted to benefit from: Procedure(s) (LRB): XI ROBOTIC ASSISTED TOTAL HYSTERECTOMY WITH BILATERAL SALPINGO OOPHORECTOMY (Bilateral) SENTINEL NODE BIOPSY (N/A) POSSIBLE LYMPH NODE DISSECTION (N/A) POSSIBLE DILATATION AND CURETTAGE (N/A) POSSIBLE INTRAUTERINE DEVICE (IUD) INSERTION (N/A).   Her initial surgery was postponed due to an admission for a facial cellulitis on her. She was treated with antibiotics and this has since resolved.  Allergies Aspirin  Medications   Current Facility-Administered Medications  Medication Dose Route Frequency Provider Last Rate Last Admin   acetaminophen (TYLENOL) tablet 1,000 mg  1,000 mg Oral Once Brennan Bailey, MD       ceFAZolin (ANCEF) IVPB 3g/100 mL premix  3 g Intravenous On Call to OR Cross, Melissa D, NP       celecoxib (CELEBREX) capsule 200 mg  200 mg Oral On Call to OR Cross, Melissa D, NP       chlorhexidine (PERIDEX) 0.12 % solution 15 mL  15 mL Mouth/Throat Once Woodrum, Chelsey L, MD       Or   Oral care mouth rinse  15 mL  Mouth Rinse Once Woodrum, Chelsey L, MD       dexamethasone (DECADRON) injection 4 mg  4 mg Intravenous On Call to OR Cross, Melissa D, NP       gabapentin (NEURONTIN) capsule 300 mg  300 mg Oral On Call to OR Cross, Melissa D, NP       heparin injection 5,000 Units  5,000 Units Subcutaneous 120 min pre-op Cross, Melissa D, NP       lactated ringers infusion   Intravenous Continuous Woodrum, Chelsey L, MD       levonorgestrel (MIRENA) 20 MCG/DAY IUD 1 each  1 each Intrauterine To OR Cross, Melissa D, NP       scopolamine (TRANSDERM-SCOP) 1 MG/3DAYS 1.5 mg  1 patch Transdermal Once Brennan Bailey, MD        Vital Signs BP 111/85   Pulse 79   Temp 98.1 F (36.7 C) (Oral)   Ht 5' 4"$  (1.626 m)   Wt (!) 320 lb 12.8 oz (145.5 kg)   LMP 10/06/2017 (Approximate) Comment: Pt did not have period for approx 4 years then started periods again this spring, irregular. neg preg test  SpO2 100%   BMI 55.07 kg/m  Facility age limit for growth %iles is 20 years. Facility age limit for growth %iles is 20 years..   Physical Exam General: Well developed, appears stated age, in no acute distress  Mental status: Alert and oriented x3 Cardiovascular: Normal Pulmonary: Symmetric chest rise, unlabored breathing Relevant System for Surgery: Surgical site examination deferred to the OR  Labs and Studies: Lab Results  Component Value Date   WBC 8.6 03/27/2022   HGB 12.6 03/27/2022   HCT 41.5 03/27/2022   PLT 256 03/27/2022    No results found for: "INR", "APTT" \

## 2022-04-12 NOTE — Anesthesia Procedure Notes (Signed)
Procedure Name: Intubation Date/Time: 04/12/2022 11:00 AM  Performed by: Lavina Hamman, CRNAPre-anesthesia Checklist: Patient identified, Emergency Drugs available, Suction available, Patient being monitored and Timeout performed Patient Re-evaluated:Patient Re-evaluated prior to induction Oxygen Delivery Method: Circle system utilized Preoxygenation: Pre-oxygenation with 100% oxygen Induction Type: IV induction Ventilation: Mask ventilation without difficulty Laryngoscope Size: 3 and Glidescope Grade View: Grade I Tube type: Oral Tube size: 7.0 mm Number of attempts: 1 Airway Equipment and Method: Stylet and Video-laryngoscopy Placement Confirmation: ETT inserted through vocal cords under direct vision, positive ETCO2, CO2 detector and breath sounds checked- equal and bilateral Secured at: 21 cm Tube secured with: Tape Dental Injury: Teeth and Oropharynx as per pre-operative assessment  Comments: ATOI

## 2022-04-12 NOTE — Transfer of Care (Signed)
Immediate Anesthesia Transfer of Care Note  Patient: Joan Graves  Procedure(s) Performed: Procedure(s): XI ROBOTIC ASSISTED TOTAL HYSTERECTOMY WITH BILATERAL SALPINGO OOPHORECTOMY (Bilateral) SENTINEL NODE INJECTION (N/A)  Patient Location: PACU  Anesthesia Type:General  Level of Consciousness:  sedated, patient cooperative and responds to stimulation  Airway & Oxygen Therapy:Patient Spontanous Breathing and Patient connected to face mask oxgen  Post-op Assessment:  Report given to PACU RN and Post -op Vital signs reviewed and stable  Post vital signs:  Reviewed and stable  Last Vitals:  Vitals:   04/12/22 0945  BP: 111/85  Pulse: 79  Temp: 36.7 C  SpO2: 123XX123    Complications: No apparent anesthesia complications

## 2022-04-13 ENCOUNTER — Encounter (HOSPITAL_COMMUNITY): Payer: Self-pay | Admitting: Psychiatry

## 2022-04-13 ENCOUNTER — Telehealth: Payer: Self-pay

## 2022-04-13 NOTE — Anesthesia Postprocedure Evaluation (Signed)
Anesthesia Post Note  Patient: Joan Graves  Procedure(s) Performed: XI ROBOTIC ASSISTED TOTAL HYSTERECTOMY WITH BILATERAL SALPINGO OOPHORECTOMY (Bilateral) SENTINEL NODE INJECTION     Patient location during evaluation: PACU Anesthesia Type: General Level of consciousness: awake and alert Pain management: pain level controlled Vital Signs Assessment: post-procedure vital signs reviewed and stable Respiratory status: spontaneous breathing, nonlabored ventilation and respiratory function stable Cardiovascular status: blood pressure returned to baseline Postop Assessment: no apparent nausea or vomiting Anesthetic complications: no   No notable events documented.  Last Vitals:  Vitals:   04/12/22 1600 04/12/22 1655  BP: 137/84 132/78  Pulse: 79 92  Resp: 18 20  Temp: (!) 36.3 C 36.5 C  SpO2: 93% 96%    Last Pain:  Vitals:   04/12/22 1655  TempSrc:   PainSc: [place]

## 2022-04-13 NOTE — Telephone Encounter (Addendum)
Spoke with Ms. Klepinger this morning. She states she is eating, drinking and urinating well. She has not had a BM. She is not passing gas. She did not p/u the senokot-s. Instructed her to p/u the generic form of senokot-s. Instructed her to take 2 tablets with 8 oz of water bid until she has a good BM. Encouraged her to drink plenty of water. She denies fever or chills. Incisions are dry and intact. She rates her pain 10/10. She took 2 tylenol last night.  Instructed her to take her tramadol now and then in 3 hours take 2 extra strength tylenol  and continue to alternate these medication for discomfort the next couple of days. She can apply ice to abdomen for comfort with a wash cloth in between ice and abdomen. Ms Mollard resumed her Eliquis this morning. Instructed to call office with any fever, chills, purulent drainage, uncontrolled pain or any other questions or concerns. Patient verbalizes understanding.   Pt aware of post op appointments as well as the office number [phone] and after hours number [phone] to call if she has any questions or concerns

## 2022-04-14 ENCOUNTER — Other Ambulatory Visit: Payer: Self-pay

## 2022-04-15 LAB — SURGICAL PATHOLOGY

## 2022-04-23 ENCOUNTER — Encounter: Payer: Self-pay | Admitting: Emergency Medicine

## 2022-04-23 ENCOUNTER — Ambulatory Visit
Admission: EM | Admit: 2022-04-23 | Discharge: 2022-04-23 | Disposition: A | Discharge status: Home / Self Care | Payer: 59 | Attending: Emergency Medicine | Admitting: Emergency Medicine

## 2022-04-23 DIAGNOSIS — R197 Diarrhea, unspecified: Secondary | ICD-10-CM | POA: Diagnosis present

## 2022-04-23 LAB — BASIC METABOLIC PANEL
Anion gap: 9 (ref 5–15)
BUN: 9 mg/dL (ref 6–20)
CO2: 26 mmol/L (ref 22–32)
Calcium: 9 mg/dL (ref 8.9–10.3)
Chloride: 104 mmol/L (ref 98–111)
Creatinine, Ser: 0.7 mg/dL (ref 0.44–1.00)
GFR, Estimated: >60 mL/min (ref >60)
Glucose, Bld: 155 mg/dL — ABNORMAL HIGH (ref 70–99)
Potassium: 3.8 mmol/L (ref 3.5–5.1)
Sodium: 139 mmol/L (ref 135–145)

## 2022-04-23 NOTE — Discharge Instructions (Addendum)
Your blood work did not show any abnormalities to your electrolytes.  You need to keep your appointment with gastroenterology in April as previously scheduled.  I would call them to see if he can be put on a wait list for a sooner appointment.  I have given you a list of food choices to help you with your diarrhea.  You should increase your dietary intake of fiber to help add bulk to her stool and try and resolve her diarrhea.  If you develop any severe abdominal pain, fever, or bloody stools you should go to the ER for evaluation.

## 2022-04-23 NOTE — ED Provider Notes (Signed)
[facility]    CSN: [id number] Arrival date & time: 04/23/22  [id number]      History   Chief Complaint Chief Complaint  Patient presents with   Diarrhea    HPI ARMENTHA GREENING is a 52 y.o. female.   HPI  52 year old female here for evaluation of diarrhea.  Patient has a past medical history that is significant for diabetes, hypertension, asthma, hypercholesterolemia, GERD, pulmonary hypertension, aortic valve stenosis, paroxysmal A-fib, sleep apnea, depression, and anxiety presenting for evaluation of 3 months worth of diarrhea.  When asked to clarify how many stools she is having today she says "too much".  She states that she had 20 stools last week.  She has not had a fever and she denies seeing any blood in her stool.  She has had no abdominal pain, nausea, or vomiting.  Her stools been unchanged over the past 3 months.  She sees GI in April.  Past Medical History:  Diagnosis Date   Anemia    Anxiety    Aortic valve stenosis    Asthma    Cancer ([place])    endometrial cancer   Chronic pulmonary hypertension ([place])    Depression    Diabetes mellitus without complication (HCC)    GERD (gastroesophageal reflux disease)    Hypercholesteremia    Hypertension    Paroxysmal atrial fibrillation ([place])    Sleep apnea     Patient Active Problem List   Diagnosis Date Noted   Cellulitis of nose 03/26/2022   Endometrial cancer ([place]) 03/26/2022   Diabetes mellitus without complication ([place]) [id number]   Morbid obesity with BMI of 50.0-59.9, adult ([place]) 03/26/2022   HLD (hyperlipidemia) 03/26/2022   Depression with anxiety 03/26/2022   Abscess of nose 03/26/2022   Family history of ovarian cancer 02/03/2022   PMB (postmenopausal bleeding) 02/03/2022   Esophageal dysphagia    Problems with swallowing and mastication    Paroxysmal atrial fibrillation ([place]) 12/09/2020   Chronic pulmonary hypertension ([place]) 06/11/2020   Moderate aortic valve stenosis 06/11/2020    Benign essential hypertension 12/02/2014   Hyperlipidemia, mixed 12/11/2013   Obstructive sleep apnea 12/11/2013   Asthma 12/09/2013   Diabetes ([place]) 12/09/2013   Gastroesophageal reflux disease 12/09/2013    Past Surgical History:  Procedure Laterality Date   CARDIOVERSION N/A 12/30/2020   Procedure: CARDIOVERSION;  Surgeon: Corey Skains, MD;  Location: [facility];  Service: Cardiovascular;  Laterality: N/A;   CATARACT EXTRACTION Right 11/2021   CATARACT EXTRACTION Left 12/2021   COLONOSCOPY     ESOPHAGOGASTRODUODENOSCOPY (EGD) WITH PROPOFOL N/A 06/29/2021   Procedure: ESOPHAGOGASTRODUODENOSCOPY (EGD) WITH PROPOFOL;  Surgeon: Lucilla Lame, MD;  Location: [facility];  Service: Endoscopy;  Laterality: N/A;   ROBOTIC ASSISTED TOTAL HYSTERECTOMY WITH BILATERAL SALPINGO OOPHERECTOMY Bilateral 04/12/2022   Procedure: XI ROBOTIC ASSISTED TOTAL HYSTERECTOMY WITH BILATERAL SALPINGO OOPHORECTOMY;  Surgeon: Bernadene Bell, MD;  Location: [facility];  Service: Gynecology;  Laterality: Bilateral;   SENTINEL NODE BIOPSY N/A 04/12/2022   Procedure: SENTINEL NODE INJECTION;  Surgeon: Bernadene Bell, MD;  Location: [facility];  Service: Gynecology;  Laterality: N/A;   TUBAL LIGATION  2000    OB History     Gravida  1   Para  1   Term  1   Preterm      AB      Living  1      SAB      IAB      Ectopic  Multiple      Live Births  1            Home Medications    Prior to Admission medications   Medication Sig Start Date End Date Taking? Authorizing Provider  ACCU-CHEK SOFTCLIX LANCETS lancets CHECK GLUCOSE TWICE DIALY FOR GLUCOSE MONITORING E11.9 06/09/17   [provider]  ADVAIR DISKUS 500-50 MCG/DOSE AEPB Inhale 1 puff into the lungs in the morning and at bedtime. 04/24/17   [provider]  amLODipine (NORVASC) 10 MG tablet Take 1 tablet by mouth daily. 10/17/18   [provider]  apixaban (ELIQUIS) 5 MG TABS tablet Take 5 mg by mouth 2  (two) times daily.    [provider]  atorvastatin (LIPITOR) 20 MG tablet Take 20 mg by mouth daily. 10/04/19   [provider]  buPROPion (WELLBUTRIN SR) 100 MG 12 hr tablet Take 100 mg by mouth 2 (two) times daily. 08/10/20   [provider]  enalapril (VASOTEC) 20 MG tablet Take 20 mg by mouth daily. 02/13/20   [provider]  ferrous sulfate 325 (65 FE) MG EC tablet Take 650 mg by mouth daily with breakfast. 11/30/15   [provider]  glucose blood test strip USE AS DIRECTED TWICE A DAY 07/08/15   [provider]  Insulin Glargine (BASAGLAR KWIKPEN) 100 UNIT/ML Inject 20 Units into the skin at bedtime as needed (if CBG over 200). 03/28/22   Sharen Hones, MD  JARDIANCE 25 MG TABS tablet Take 25 mg by mouth daily. 07/16/20   [provider]  metFORMIN (GLUCOPHAGE-XR) 500 MG 24 hr tablet Take 1,000 mg by mouth 2 (two) times daily with a meal. 05/26/17   [provider]  metoprolol tartrate (LOPRESSOR) 25 MG tablet Take 12.5 mg by mouth 3 (three) times daily. 03/25/22   [provider]  nystatin (MYCOSTATIN/NYSTOP) powder Apply 1 Application topically 3 (three) times daily for 14 days. 04/12/22 04/26/22  Lahoma Crocker, MD  omeprazole (PRILOSEC) 40 MG capsule Take 40 mg by mouth daily. 12/15/21   [provider]    Family History Family History  Problem Relation Age of Onset   Ovarian cancer Mother        mid 65s   Lung cancer Mother 7   Throat cancer Maternal Grandmother    Colon cancer Neg Hx    Breast cancer Neg Hx    Endometrial cancer Neg Hx    Pancreatic cancer Neg Hx    Prostate cancer Neg Hx     Social History Social History   Tobacco Use   Smoking status: Former    Types: Cigarettes    Passive exposure: Past   Smokeless tobacco: Never  Vaping Use   Vaping Use: Every day   Substances: Nicotine, Flavoring  Substance Use Topics   Alcohol use: Not Currently    Comment: occ   Drug  use: Never     Allergies   Aspirin   Review of Systems Review of Systems  Constitutional:  Negative for fever.  Gastrointestinal:  Positive for diarrhea. Negative for abdominal distention, abdominal pain, blood in stool, nausea and vomiting.     Physical Exam Triage Vital Signs ED Triage Vitals  Enc Vitals Group     BP 04/23/22 0939 125/78     Pulse Rate 04/23/22 0939 79     Resp 04/23/22 0939 16     Temp 04/23/22 0939 97.7 F (36.5 C)     Temp Source 04/23/22 0939 Oral  SpO2 04/23/22 0939 96 %     Weight --      Height --      Head Circumference --      Peak Flow --      Pain Score 04/23/22 0936 9     Pain Loc --      Pain Edu? --      Excl. in [place]? --    No data found.  Updated Vital Signs BP 125/78 (BP Location: Left Wrist)   Pulse 79   Temp 97.7 F (36.5 C) (Oral)   Resp 16   LMP 10/06/2017 (Approximate) Comment: Pt did not have period for approx 4 years then started periods again this spring, irregular. neg preg test  SpO2 96%   Visual Acuity Right Eye Distance:   Left Eye Distance:   Bilateral Distance:    Right Eye Near:   Left Eye Near:    Bilateral Near:     Physical Exam Vitals and nursing note reviewed.  Constitutional:      Appearance: Normal appearance. She is obese. She is not ill-appearing.  Cardiovascular:     Rate and Rhythm: Normal rate and regular rhythm.     Pulses: Normal pulses.     Heart sounds: Normal heart sounds. No murmur heard.    No friction rub. No gallop.  Pulmonary:     Effort: Pulmonary effort is normal.     Breath sounds: Normal breath sounds. No wheezing, rhonchi or rales.  Abdominal:     Palpations: Abdomen is soft.     Tenderness: There is abdominal tenderness. There is no guarding or rebound.     Comments: Patient abdomen is protuberant but soft.  She has mild diffuse tenderness but no focal findings.  No guarding or rebound.  Skin:    General: Skin is warm and dry.     Capillary Refill: Capillary refill  takes less than 2 seconds.     Findings: No rash.  Neurological:     General: No focal deficit present.     Mental Status: She is alert and oriented to person, place, and time.      UC Treatments / Results  Labs (all labs ordered are listed, but only abnormal results are displayed) Labs Reviewed  BASIC METABOLIC PANEL - Abnormal; Notable for the following components:      Result Value   Glucose, Bld 155 (*)    All other components within normal limits    EKG   Radiology No results found.  Procedures Procedures (including critical care time)  Medications Ordered in UC Medications - No data to display  Initial Impression / Assessment and Plan / UC Course  I have reviewed the triage vital signs and the nursing notes.  Pertinent labs & imaging results that were available during my care of the patient were reviewed by me and considered in my medical decision making (see chart for details).   The patient is a nontoxic-appearing 52 year old female here for evaluation of 3 months worth of diarrhea that has been a mixture of liquid and loose stools.  She has not had a formed BM in 3 months.  She was evaluated for diarrhea in the ER at [facility] on 03/15/2022 and had a CT scan that showed a small fat-containing hernia but no other abnormalities.  A stool panel was also collected at that time which was negative for C. difficile or other pathogens.  The patient reports that nothing is changed about her diarrhea as far  as frequency or consistency.  She has had no fever, abdominal pain, nausea, or vomiting.  Also no blood in her stool.  She has not tried increasing her dietary intake of fiber to see if helps with her diarrhea.  She is on Wellbutrin and metformin which both can be contributing to her diarrhea stools.  She was supposed to see GI on 13 February but that had to be moved secondary to hysterectomy for endometrial cancer.  She sees GI on June 27, 2022.  On exam patient abdomen is  protuberant but soft.  She has some generalized tenderness but no focal findings and she has no guarding or rebound.  She states that she is able to eat and drink without difficulty and she has had no nausea or vomiting.  Due to the fact that she has had continued diarrhea I will obtain a BMP just to evaluate for any electrolyte abnormalities.  BMP shows normal electrolytes with a sodium of 139 and potassium 3.8.  Chloride is also normal at 104.  BUN is 9 and creatinine is 0.70.  Discharge patient home with a diagnosis of diarrhea and have her increase her dietary intake of fiber to try and help resolve her diarrhea.  She should keep her appointment with GI in April as previously scheduled and she should contact them to try and be seen earlier.   Final Clinical Impressions(s) / UC Diagnoses   Final diagnoses:  Diarrhea, unspecified type     Discharge Instructions      Your blood work did not show any abnormalities to your electrolytes.  You need to keep your appointment with gastroenterology in April as previously scheduled.  I would call them to see if he can be put on a wait list for a sooner appointment.  I have given you a list of food choices to help you with your diarrhea.  You should increase your dietary intake of fiber to help add bulk to her stool and try and resolve her diarrhea.  If you develop any severe abdominal pain, fever, or bloody stools you should go to the ER for evaluation.     ED Prescriptions   None    PDMP not reviewed this encounter.   Margarette Canada, NP 04/23/22 1038

## 2022-04-23 NOTE — ED Triage Notes (Addendum)
Pt presents with diarrhea x 3 months. Pt has a GI appointment scheduled for 06/27/22.

## 2022-04-25 ENCOUNTER — Other Ambulatory Visit: Payer: Self-pay | Admitting: *Deleted

## 2022-04-25 NOTE — Progress Notes (Signed)
The proposed treatment discussed in conference is for discussion purpose only and is not a binding recommendation.  The patients have not been physically examined, or presented with their treatment options.  Therefore, final treatment plans cannot be decided.  

## 2022-05-02 ENCOUNTER — Other Ambulatory Visit: Payer: Self-pay

## 2022-05-02 ENCOUNTER — Inpatient Hospital Stay: Payer: 59 | Attending: Psychiatry | Admitting: Psychiatry

## 2022-05-02 ENCOUNTER — Encounter: Payer: Self-pay | Admitting: Psychiatry

## 2022-05-02 VITALS — BP 135/83 | HR 98 | Temp 97.6°F | Resp 16 | Wt 323.6 lb

## 2022-05-02 DIAGNOSIS — L03818 Cellulitis of other sites: Secondary | ICD-10-CM | POA: Diagnosis not present

## 2022-05-02 DIAGNOSIS — Z90710 Acquired absence of both cervix and uterus: Secondary | ICD-10-CM | POA: Diagnosis not present

## 2022-05-02 DIAGNOSIS — N760 Acute vaginitis: Secondary | ICD-10-CM

## 2022-05-02 DIAGNOSIS — Z90722 Acquired absence of ovaries, bilateral: Secondary | ICD-10-CM | POA: Insufficient documentation

## 2022-05-02 DIAGNOSIS — C541 Malignant neoplasm of endometrium: Secondary | ICD-10-CM | POA: Diagnosis present

## 2022-05-02 DIAGNOSIS — Z7189 Other specified counseling: Secondary | ICD-10-CM

## 2022-05-02 DIAGNOSIS — T8149XA Infection following a procedure, other surgical site, initial encounter: Secondary | ICD-10-CM | POA: Insufficient documentation

## 2022-05-02 MED ORDER — AMOXICILLIN-POT CLAVULANATE 875-125 MG PO TABS: 1.0000 | ORAL_TABLET | Freq: Two times a day (BID) | ORAL | 0 refills | Status: AC

## 2022-05-02 NOTE — Patient Instructions (Signed)
It was a pleasure to see you in clinic today. - Pick the prescription and take twice daily for a week. - Return visit planned for 2 weeks. - Still no lifting >6lbs and nothing in vagina.  Thank you very much for allowing me to provide care for you today.  I appreciate your confidence in choosing our Gynecologic Oncology team at [facility].  If you have any questions about your visit today please call our office or send [place] a MyChart message and we will get back to you as soon as possible.

## 2022-05-02 NOTE — Progress Notes (Addendum)
Gynecologic Oncology Return Clinic Visit  Date of Service: 05/02/2022 Referring Provider: Nicholaus Bloom, MD   Assessment & Plan: Joan Graves is a 52 y.o. 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  Postop: - Pt recovering well from surgery and healing appropriately postoperatively - Intraoperative findings and pathology results reviewed. - Ongoing postoperative expectations and precautions reviewed. Continue with no lifting >10lbs through 6 weeks postoperatively - Nothing in the vagina for 8-12 weeks. - Incision appear to have had reaction to dermabond. Recommend benadryl or antihistamine as needed.  Cuff cellulitis: - Discharge and erythema - Rx for augmentin x7d - Return for cuff check in 2 weeks  Endometrial cancer: - Pathology results reviewed in detail - Intermediate risk factors of >50% MI and grade 2. Does not quite meet GOG99 criteria as not quite deep third myometrial invasion. However, lower uterine segment was involved. - Lymph nodes unable to be assessed due to obesity. Preop CT scan without lymphadenopathy.  - Recommend consideration of VBT. Referral to rad onc placed.  - Tumor board recommendation for consideration of POLE testing. Will work to get this sent. - Signs/symptoms of recurrence reviewed. - Surveillance reviewed. Plan follow-up about 3 months after completion of VBT.   RTC 2 weeks for cuff check.  Clide Cliff, MD Gynecologic Oncology   Medical Decision Making I personally spent  TOTAL 25 minutes face-to-face and non-face-to-face in the care of this patient, which includes all pre, intra, and post visit time on the date of service.   ----------------------- Reason for Visit: Postop/Treatment counseling  Treatment History: Oncology History  Endometrial cancer (HCC)  02/15/2022 Initial Biopsy   Endometrial biopsy: FIGO  grade 2 endometrioid adenocarcinoma   03/26/2022 Initial Diagnosis   Endometrial cancer (HCC)   04/12/2022 Surgery   Robotic-assisted total laparoscopic hysterectomy, bilateral salpingo-oophorectomy, bilateral sentinel lymph node evaluation (no biopsy due to morbid obesity and inability to visualize lymph channels/sentinel lymph node)   04/12/2022 Pathology Results   A. UTERUS WITH RIGHT AND LEFT FALLOPIAN TUBE AND OVARY, HYSTERECTOMY AND  BILATERAL SALPINGO-OOPHORECTOMY:  Invasive well to moderately differentiated endometrioid adenocarcinoma  with squamous morular metaplasia, FIGO 2  Tumor invades greater than 50% of the myometrium within the lower  uterine segment (7 of 11 mm, 64%) (pT1b)  Margins free  Bilateral acute and chronic salpingitis, proximally  Benign ovaries   An immunohistochemical stain for p53 performed on the biopsy  ([id number]) exhibited a wild-type staining pattern.   IHC EXPRESSION RESULTS  TEST           RESULT  MLH1:          Preserved nuclear expression  MSH2:          Preserved nuclear expression  MSH6:          Preserved nuclear expression  PMS2:          Preserved nuclear expression      Interval History: Pt reports that she is recovering well from surgery. She is not needing anything for pain. She is eating and drinking well. She is voiding without issue. She reports diarrhea that is not new (ongoing since November). She reports itching at her incision sites.   Past Medical/Surgical History: Past Medical History:  Diagnosis Date   Anemia    Anxiety    Aortic valve stenosis    Asthma    Cancer (HCC)  endometrial cancer   Chronic pulmonary hypertension (HCC)    Depression    Diabetes mellitus without complication (HCC)    GERD (gastroesophageal reflux disease)    Hypercholesteremia    Hypertension    Paroxysmal atrial fibrillation (HCC)    Sleep apnea     Past Surgical History:  Procedure Laterality Date   CARDIOVERSION N/A 12/30/2020    Procedure: CARDIOVERSION;  Surgeon: Lamar Blinks, MD;  Location: [facility];  Service: Cardiovascular;  Laterality: N/A;   CATARACT EXTRACTION Right 11/2021   CATARACT EXTRACTION Left 12/2021   COLONOSCOPY     ESOPHAGOGASTRODUODENOSCOPY (EGD) WITH PROPOFOL N/A 06/29/2021   Procedure: ESOPHAGOGASTRODUODENOSCOPY (EGD) WITH PROPOFOL;  Surgeon: Midge Minium, MD;  Location: [facility];  Service: Endoscopy;  Laterality: N/A;   ROBOTIC ASSISTED TOTAL HYSTERECTOMY WITH BILATERAL SALPINGO OOPHERECTOMY Bilateral 04/12/2022   Procedure: XI ROBOTIC ASSISTED TOTAL HYSTERECTOMY WITH BILATERAL SALPINGO OOPHORECTOMY;  Surgeon: Clide Cliff, MD;  Location: [facility];  Service: Gynecology;  Laterality: Bilateral;   SENTINEL NODE BIOPSY N/A 04/12/2022   Procedure: SENTINEL NODE INJECTION;  Surgeon: Clide Cliff, MD;  Location: [facility];  Service: Gynecology;  Laterality: N/A;   TUBAL LIGATION  2000    Family History  Problem Relation Age of Onset   Ovarian cancer Mother        mid 66s   Lung cancer Mother 63   Throat cancer Maternal Grandmother    Colon cancer Neg Hx    Breast cancer Neg Hx    Endometrial cancer Neg Hx    Pancreatic cancer Neg Hx    Prostate cancer Neg Hx     Social History   Socioeconomic History   Marital status: Married    Spouse name: Not on file   Number of children: Not on file   Years of education: Not on file   Highest education level: Not on file  Occupational History   Not on file  Tobacco Use   Smoking status: Former    Types: Cigarettes    Passive exposure: Past   Smokeless tobacco: Never  Vaping Use   Vaping Use: Every day   Substances: Nicotine, Flavoring  Substance and Sexual Activity   Alcohol use: Not Currently    Comment: occ   Drug use: Never   Sexual activity: Yes    Birth control/protection: Surgical    Comment: Tubal Ligation  Other Topics Concern   Not on file  Social History Narrative   Not on file   Social Determinants of  Health   Financial Resource Strain: Not on file  Food Insecurity: Unknown (03/26/2022)   Hunger Vital Sign    Worried About Running Out of Food in the Last Year: Patient refused    Ran Out of Food in the Last Year: Patient refused  Transportation Needs: Not on file  Physical Activity: Not on file  Stress: Not on file  Social Connections: Not on file    Current Medications:  Current Outpatient Medications:    ACCU-CHEK SOFTCLIX LANCETS lancets, CHECK GLUCOSE TWICE DIALY FOR GLUCOSE MONITORING E11.9, Disp: , Rfl: 5   ADVAIR DISKUS 500-50 MCG/DOSE AEPB, Inhale 1 puff into the lungs in the morning and at bedtime., Disp: , Rfl: 5   amLODipine (NORVASC) 10 MG tablet, Take 1 tablet by mouth daily., Disp: , Rfl:    amoxicillin-clavulanate (AUGMENTIN) 875-125 MG tablet, Take 1 tablet by mouth 2 (two) times daily for 7 days., Disp: 14 tablet, Rfl: 0   apixaban (ELIQUIS) 5 MG TABS  tablet, Take 5 mg by mouth 2 (two) times daily., Disp: , Rfl:    atorvastatin (LIPITOR) 20 MG tablet, Take 20 mg by mouth daily., Disp: , Rfl:    buPROPion (WELLBUTRIN SR) 100 MG 12 hr tablet, Take 100 mg by mouth 2 (two) times daily., Disp: , Rfl:    enalapril (VASOTEC) 20 MG tablet, Take 20 mg by mouth daily., Disp: , Rfl:    ferrous sulfate 325 (65 FE) MG EC tablet, Take 650 mg by mouth daily with breakfast., Disp: , Rfl:    glucose blood test strip, USE AS DIRECTED TWICE A DAY, Disp: , Rfl:    Insulin Glargine (BASAGLAR KWIKPEN) 100 UNIT/ML, Inject 20 Units into the skin at bedtime as needed (if CBG over 200)., Disp: , Rfl:    JARDIANCE 25 MG TABS tablet, Take 25 mg by mouth daily., Disp: , Rfl:    metFORMIN (GLUCOPHAGE-XR) 500 MG 24 hr tablet, Take 1,000 mg by mouth 2 (two) times daily with a meal., Disp: , Rfl: 1   metoprolol tartrate (LOPRESSOR) 25 MG tablet, Take 12.5 mg by mouth 3 (three) times daily., Disp: , Rfl:    omeprazole (PRILOSEC) 40 MG capsule, Take 40 mg by mouth daily., Disp: , Rfl:   Review of  Symptoms: Complete 10-system review is positive for: shortness of breath, diarrhea, anxiety  Physical Exam: BP 135/83 (BP Location: Left Wrist, Patient Position: Sitting)   Pulse 98   Temp 97.6 F (36.4 C) (Oral)   Resp 16   Wt (!) 323 lb 9.6 oz (146.8 kg)   LMP 10/06/2017 (Approximate) Comment: Pt did not have period for approx 4 years then started periods again this spring, irregular. neg preg test  SpO2 100%   BMI 55.55 kg/m  General: Alert, oriented, no acute distress. HEENT: Normocephalic, atraumatic. Neck symmetric without masses. Sclera anicteric.  Chest: Normal work of breathing. Clear to auscultation bilaterally.   Cardiovascular: Regular rate and rhythm, no murmurs. Abdomen: Soft, nontender.  Normoactive bowel sounds.  No masses or hepatosplenomegaly appreciated.  Robotic incisions with reactive skin changes around each incision in the distribution of prior glue, do not appear consistent with infection. Extremities: Grossly normal range of motion.  Warm, well perfused.  No edema bilaterally. Chronic skin changes over both lower extremities Skin: Improved yeast skin infection in bilateral groins GU: Normal appearing external genitalia without erythema, excoriation, or lesions.  Speculum exam reveals greenish brown discharge at cuff. Intact cuff with some erythema.  Bimanual exam reveals intact vaginal cuff, mild TTP. Exam chaperoned by Kimberly [place], CMA   Laboratory & Radiologic Studies: A. UTERUS WITH RIGHT AND LEFT FALLOPIAN TUBE AND OVARY, HYSTERECTOMY AND BILATERAL SALPINGO-OOPHORECTOMY: Invasive well to moderately differentiated endometrioid adenocarcinoma with squamous morular metaplasia, FIGO 2 Tumor invades greater than 50% of the myometrium within the lower uterine segment (7 of 11 mm, 64%) (pT1b) Margins free Bilateral acute and chronic salpingitis, proximally Benign ovaries  ONCOLOGY TABLE:  UTERUS, CARCINOMA OR CARCINOSARCOMA: Resection  Procedure: Total  hysterectomy and bilateral salpingo-oophorectomy Histologic Type: Endometrioid adenocarcinoma Histologic Grade: Well 2 moderately differentiated, FIGO 2 Myometrial Invasion:      Depth of Myometrial Invasion (mm): 7 mm      Myometrial Thickness (mm): 11 mm      Percentage of Myometrial Invasion: 64% Uterine Serosa Involvement: Not identified Cervical stromal Involvement: Not identified (see comment) Extent of involvement of other tissue/organs: Not identified Peritoneal/Ascitic Fluid: Not sampled Lymphovascular Invasion: Not identified Regional Lymph Nodes: Not applicable (no lymph  nodes submitted or found)  Distant Metastasis: Not applicable Pathologic Stage Classification (pTNM, AJCC 8th Edition): pT1b, pN n/a Ancillary Studies: MMR has been ordered will be ordered with reflex MSI testing as indicated. Representative Tumor Block: A5 Comment(s): An immunohistochemical stain for p53 performed on the biopsy ([id number]) exhibited a wild-type staining pattern.  Within the sections of the lower uterine segment the tumor involves the lower uterine segment to the level of the endocervix, but the tumor does not involve the endocervix near the transformation zone.

## 2022-05-03 DIAGNOSIS — K529 Noninfective gastroenteritis and colitis, unspecified: Secondary | ICD-10-CM | POA: Insufficient documentation

## 2022-05-03 NOTE — Progress Notes (Signed)
GYN Location of Tumor / Histology: Endometrial  Joan Graves presented with symptoms of: (per Dr. Newton1/04/2022 note) Patient presented to her OB/GYN for postmenopausal bleeding.  Patient reported she went through menopause in 2019 but then resumed some bleeding over a year ago.  She had an ultrasound performed which showed a thickened irregular endometrium measuring 11 mm.  She underwent an endometrial biopsy on 02/15/2022 which returned with FIGO grade 2 endometrioid adenocarcinoma, p53wt. Pap was also collectd on 02/03/22 and NILM, HPV neg.    Biopsies revealed:  04/12/2022 A. UTERUS WITH RIGHT AND LEFT FALLOPIAN TUBE AND OVARY, HYSTERECTOMY AND BILATERAL SALPINGO-OOPHORECTOMY: - Invasive well to moderately differentiated endometrioid adenocarcinoma with squamous morular metaplasia, FIGO 2 - Tumor invades greater than 50% of the myometrium within the lower uterine segment (7 of 11 mm, 64%) (pT1b) - Margins free - Bilateral acute and chronic salpingitis, proximally - Benign ovaries  Past/Anticipated interventions by Gyn/Onc surgery, if any:  05/02/2022 Dr. Bernadene Bell (office visit)  --Postop: Pt recovering well from surgery and healing appropriately postoperatively Intraoperative findings and pathology results reviewed. Ongoing postoperative expectations and precautions reviewed.  Continue with no lifting >10lbs through 6 weeks postoperatively Nothing in the vagina for 8-12 weeks. Incision appear to have had reaction to dermabond.  Recommend benadryl or antihistamine as needed. --Cuff cellulitis: Discharge and erythema Rx for augmentin x7d Return for cuff check in 2 weeks --Endometrial cancer: Pathology results reviewed in detail Intermediate risk factors of >50% MI and grade 2.  Does not quite meet GOG99 criteria as not quite deep third myometrial invasion.  However, lower uterine segment was involved. Lymph nodes unable to be assessed due to obesity.  Preop CT  scan without lymphadenopathy.  Recommend consideration of VBT.  Referral to rad onc placed.  Tumor board recommendation for consideration of POLE testing.  Will work to get this sent. Signs/symptoms of recurrence reviewed. Surveillance reviewed.  Plan follow-up about 3 months after completion of VBT. --RTC 2 weeks for cuff check  04/12/2022 --Dr. Bernadene Bell Robotic-assisted total laparoscopic hysterectomy Bilateral salpingo-oophorectomy Bilateral sentinel lymph node evaluation   Weight changes, if any:  Wt Readings from Last 3 Encounters:  05/05/22 (!) 325 lb 3.2 oz (147.5 kg)  05/02/22 (!) 323 lb 9.6 oz (146.8 kg)  04/12/22 (!) 320 lb 12.8 oz (145.5 kg)   Bowel/Bladder complaints, if any: Reports she's been dealing with diarrhea since last November. States OTC Imodium and prescription Bentyl do not help (the latter made symptoms worse per patient). Denies any changes or concerns with urinary habits. Denies any vaginal bleeding since surgery   Nausea/Vomiting, if any: Denies  Pain issues, if any:  Denies  SAFETY ISSUES: Prior radiation? No Pacemaker/ICD? No Possible current pregnancy? No--hysterectomy Is the patient on methotrexate? No  Current Complaints / other details:  Reports she had a colonoscopy ~15 years ago and is due for another in April

## 2022-05-04 NOTE — Progress Notes (Signed)
Radiation Oncology         ([phone]) [phone] ________________________________  Initial Outpatient Consultation  Name: Joan Graves MRN: [id number]  Date: 05/05/2022  DOB: Sep 22, 1970  [id number], [place]  Bernadene Bell, MD   REFERRING PHYSICIAN: Bernadene Bell, MD  DIAGNOSIS: The encounter diagnosis was Endometrial cancer [facility]).  {diagnosis}  HISTORY OF PRESENT ILLNESS::Joan Graves is a 52 y.o. female who is accompanied by ***. she is seen as a courtesy of Dr. Ernestina Patches for an opinion concerning radiation therapy as part of management for her recently diagnosed endometrial cancer.   The patient presented to her OB/GYN on 02/03/22 with c/o weekly postmenopausal bleeding x 1 year. Pelvic ultrasound performed on that date showed ***   PREVIOUS RADIATION THERAPY: {EXAM; YES/NO:19492::"No"}  PAST MEDICAL HISTORY:  Past Medical History:  Diagnosis Date   Anemia    Anxiety    Aortic valve stenosis    Asthma    Cancer ([place])    endometrial cancer   Chronic pulmonary hypertension ([employer])    Depression    Diabetes mellitus without complication (HCC)    GERD (gastroesophageal reflux disease)    Hypercholesteremia    Hypertension    Paroxysmal atrial fibrillation ([place])    Sleep apnea     PAST SURGICAL HISTORY: Past Surgical History:  Procedure Laterality Date   CARDIOVERSION N/A 12/30/2020   Procedure: CARDIOVERSION;  Surgeon: Corey Skains, MD;  Location: [facility] ORS;  Service: Cardiovascular;  Laterality: N/A;   CATARACT EXTRACTION Right 11/2021   CATARACT EXTRACTION Left 12/2021   COLONOSCOPY     ESOPHAGOGASTRODUODENOSCOPY (EGD) WITH PROPOFOL N/A 06/29/2021   Procedure: ESOPHAGOGASTRODUODENOSCOPY (EGD) WITH PROPOFOL;  Surgeon: Lucilla Lame, MD;  Location: [facility] ENDOSCOPY;  Service: Endoscopy;  Laterality: N/A;   ROBOTIC ASSISTED TOTAL HYSTERECTOMY WITH BILATERAL SALPINGO OOPHERECTOMY Bilateral 04/12/2022   Procedure: XI ROBOTIC ASSISTED  TOTAL HYSTERECTOMY WITH BILATERAL SALPINGO OOPHORECTOMY;  Surgeon: Bernadene Bell, MD;  Location: [facility] ORS;  Service: Gynecology;  Laterality: Bilateral;   SENTINEL NODE BIOPSY N/A 04/12/2022   Procedure: SENTINEL NODE INJECTION;  Surgeon: Bernadene Bell, MD;  Location: [facility] ORS;  Service: Gynecology;  Laterality: N/A;   TUBAL LIGATION  2000    FAMILY HISTORY:  Family History  Problem Relation Age of Onset   Ovarian cancer Mother        mid 45s   Lung cancer Mother 43   Throat cancer Maternal Grandmother    Colon cancer Neg Hx    Breast cancer Neg Hx    Endometrial cancer Neg Hx    Pancreatic cancer Neg Hx    Prostate cancer Neg Hx     SOCIAL HISTORY:  Social History   Tobacco Use   Smoking status: Former    Types: Cigarettes    Passive exposure: Past   Smokeless tobacco: Never  Vaping Use   Vaping Use: Every day   Substances: Nicotine, Flavoring  Substance Use Topics   Alcohol use: Not Currently    Comment: occ   Drug use: Never    ALLERGIES:  Allergies  Allergen Reactions   Aspirin Diarrhea    "high doses" gives her diarrhea    MEDICATIONS:  Current Outpatient Medications  Medication Sig Dispense Refill   ACCU-CHEK SOFTCLIX LANCETS lancets CHECK GLUCOSE TWICE DIALY FOR GLUCOSE MONITORING E11.9  5   ADVAIR DISKUS 500-50 MCG/DOSE AEPB Inhale 1 puff into the lungs in the morning and at bedtime.  5   amLODipine (NORVASC) 10 MG tablet Take 1 tablet  by mouth daily.     amoxicillin-clavulanate (AUGMENTIN) 875-125 MG tablet Take 1 tablet by mouth 2 (two) times daily for 7 days. 14 tablet 0   apixaban (ELIQUIS) 5 MG TABS tablet Take 5 mg by mouth 2 (two) times daily.     atorvastatin (LIPITOR) 20 MG tablet Take 20 mg by mouth daily.     buPROPion (WELLBUTRIN SR) 100 MG 12 hr tablet Take 100 mg by mouth 2 (two) times daily.     enalapril (VASOTEC) 20 MG tablet Take 20 mg by mouth daily.     ferrous sulfate 325 (65 FE) MG EC tablet Take 650 mg by mouth daily with  breakfast.     glucose blood test strip USE AS DIRECTED TWICE A DAY     Insulin Glargine (BASAGLAR KWIKPEN) 100 UNIT/ML Inject 20 Units into the skin at bedtime as needed (if CBG over 200).     JARDIANCE 25 MG TABS tablet Take 25 mg by mouth daily.     metFORMIN (GLUCOPHAGE-XR) 500 MG 24 hr tablet Take 1,000 mg by mouth 2 (two) times daily with a meal.  1   metoprolol tartrate (LOPRESSOR) 25 MG tablet Take 12.5 mg by mouth 3 (three) times daily.     omeprazole (PRILOSEC) 40 MG capsule Take 40 mg by mouth daily.     No current facility-administered medications for this encounter.    REVIEW OF SYSTEMS:  A 10+ POINT REVIEW OF SYSTEMS WAS OBTAINED including neurology, dermatology, psychiatry, cardiac, respiratory, lymph, extremities, GI, GU, musculoskeletal, constitutional, reproductive, HEENT. ***   PHYSICAL EXAM:  vitals were not taken for this visit.   General: Alert and oriented, in no acute distress HEENT: Head is normocephalic. Extraocular movements are intact. Oropharynx is clear. Neck: Neck is supple, no palpable cervical or supraclavicular lymphadenopathy. Heart: Regular in rate and rhythm with no murmurs, rubs, or gallops. Chest: Clear to auscultation bilaterally, with no rhonchi, wheezes, or rales. Abdomen: Soft, nontender, nondistended, with no rigidity or guarding. Extremities: No cyanosis or edema. Lymphatics: see Neck Exam Skin: No concerning lesions. Musculoskeletal: symmetric strength and muscle tone throughout. Neurologic: Cranial nerves II through XII are grossly intact. No obvious focalities. Speech is fluent. Coordination is intact. Psychiatric: Judgment and insight are intact. Affect is appropriate. ***  ECOG = ***  0 - Asymptomatic (Fully active, able to carry on all predisease activities without restriction)  1 - Symptomatic but completely ambulatory (Restricted in physically strenuous activity but ambulatory and able to carry out work of a light or sedentary  nature. For example, light housework, office work)  2 - Symptomatic, <50% in bed during the day (Ambulatory and capable of all self care but unable to carry out any work activities. Up and about more than 50% of waking hours)  3 - Symptomatic, >50% in bed, but not bedbound (Capable of only limited self-care, confined to bed or chair 50% or more of waking hours)  4 - Bedbound (Completely disabled. Cannot carry on any self-care. Totally confined to bed or chair)  5 - Death   Eustace Pen MM, Creech RH, Tormey DC, et al. [phone]). "Toxicity and response criteria of the [facility]". [place] Oncol. 5 (6): 649-55  LABORATORY DATA:  Lab Results  Component Value Date   WBC 6.0 04/12/2022   HGB 13.7 04/12/2022   HCT 46.1 (H) 04/12/2022   MCV 90.9 04/12/2022   PLT 248 04/12/2022   NEUTROABS 7.4 03/26/2022   Lab Results  Component Value Date  NA 139 04/23/2022   K 3.8 04/23/2022   CL 104 04/23/2022   CO2 26 04/23/2022   GLUCOSE 155 (H) 04/23/2022   BUN 9 04/23/2022   CREATININE 0.70 04/23/2022   CALCIUM 9.0 04/23/2022      RADIOGRAPHY: No results found.    IMPRESSION: {diagnosis}  ***  Today, I talked to the patient and family about the findings and work-up thus far.  We discussed the natural history of *** and general treatment, highlighting the role of radiotherapy in the management.  We discussed the available radiation techniques, and focused on the details of logistics and delivery.  We reviewed the anticipated acute and late sequelae associated with radiation in this setting.  The patient was encouraged to ask questions that I answered to the best of my ability. *** A patient consent form was discussed and signed.  We retained a copy for our records.  The patient would like to proceed with radiation and will be scheduled for CT simulation.  PLAN: ***    *** minutes of total time was spent for this patient encounter, including preparation, face-to-face  counseling with the patient and coordination of care, physical exam, and documentation of the encounter.   ------------------------------------------------  Blair Promise, PhD, MD  This document serves as a record of services personally performed by Gery Pray, MD. It was created on his behalf by Roney Mans, a trained medical scribe. The creation of this record is based on the scribe's personal observations and the provider's statements to them. This document has been checked and approved by the attending provider.

## 2022-05-05 ENCOUNTER — Ambulatory Visit
Admission: RE | Admit: 2022-05-05 | Discharge: 2022-05-05 | Disposition: A | Discharge status: Home / Self Care | Payer: 59 | Source: Ambulatory Visit | Attending: Radiation Oncology | Admitting: Radiation Oncology

## 2022-05-05 ENCOUNTER — Other Ambulatory Visit: Payer: Self-pay

## 2022-05-05 VITALS — BP 123/90 | HR 91 | Temp 97.7°F | Resp 20 | Ht 64.0 in | Wt 325.2 lb

## 2022-05-05 DIAGNOSIS — Z87891 Personal history of nicotine dependence: Secondary | ICD-10-CM | POA: Diagnosis not present

## 2022-05-05 DIAGNOSIS — Z7901 Long term (current) use of anticoagulants: Secondary | ICD-10-CM | POA: Diagnosis not present

## 2022-05-05 DIAGNOSIS — E7800 Pure hypercholesterolemia, unspecified: Secondary | ICD-10-CM | POA: Insufficient documentation

## 2022-05-05 DIAGNOSIS — I480 Paroxysmal atrial fibrillation: Secondary | ICD-10-CM | POA: Diagnosis not present

## 2022-05-05 DIAGNOSIS — C541 Malignant neoplasm of endometrium: Secondary | ICD-10-CM

## 2022-05-05 DIAGNOSIS — Z79899 Other long term (current) drug therapy: Secondary | ICD-10-CM | POA: Diagnosis not present

## 2022-05-05 DIAGNOSIS — Z801 Family history of malignant neoplasm of trachea, bronchus and lung: Secondary | ICD-10-CM | POA: Diagnosis not present

## 2022-05-05 DIAGNOSIS — Z794 Long term (current) use of insulin: Secondary | ICD-10-CM | POA: Diagnosis not present

## 2022-05-05 DIAGNOSIS — I350 Nonrheumatic aortic (valve) stenosis: Secondary | ICD-10-CM | POA: Insufficient documentation

## 2022-05-05 DIAGNOSIS — J45909 Unspecified asthma, uncomplicated: Secondary | ICD-10-CM | POA: Diagnosis not present

## 2022-05-05 DIAGNOSIS — E119 Type 2 diabetes mellitus without complications: Secondary | ICD-10-CM | POA: Diagnosis not present

## 2022-05-05 DIAGNOSIS — I10 Essential (primary) hypertension: Secondary | ICD-10-CM | POA: Diagnosis not present

## 2022-05-05 DIAGNOSIS — I272 Pulmonary hypertension, unspecified: Secondary | ICD-10-CM | POA: Insufficient documentation

## 2022-05-05 DIAGNOSIS — Z8041 Family history of malignant neoplasm of ovary: Secondary | ICD-10-CM | POA: Diagnosis not present

## 2022-05-05 DIAGNOSIS — K219 Gastro-esophageal reflux disease without esophagitis: Secondary | ICD-10-CM | POA: Insufficient documentation

## 2022-05-05 DIAGNOSIS — Z7984 Long term (current) use of oral hypoglycemic drugs: Secondary | ICD-10-CM | POA: Insufficient documentation

## 2022-05-05 DIAGNOSIS — G4730 Sleep apnea, unspecified: Secondary | ICD-10-CM | POA: Insufficient documentation

## 2022-05-11 ENCOUNTER — Other Ambulatory Visit: Payer: Self-pay | Admitting: Obstetrics and Gynecology

## 2022-05-11 DIAGNOSIS — Z1231 Encounter for screening mammogram for malignant neoplasm of breast: Secondary | ICD-10-CM

## 2022-05-22 NOTE — Progress Notes (Addendum)
Gynecologic Oncology Return Clinic Visit  Date of Service: 05/23/2022 Referring Provider: Nicholaus Bloom, MD   Assessment & Plan: Joan Graves is a 52 y.o. woman with incompletely staged Stage IB (FIGO 2023 staging) FIGO grade 2 endometrioid endometrial cancer (no LVSI, 64% MI, p53wt, MMRp) who is 6 weeks s/p RA-TLH, BSO on 04/12/22.  Postop: - Pt recovering well from surgery and healing appropriately postoperatively - Ongoing postoperative expectations and precautions reviewed.  - Nothing in the vagina for 10 weeks (from surgery).  Cuff cellulitis: -Resolved.  Well-healing on exam.  Endometrial cancer: - Pathology results reviewed in detail - Intermediate risk factors. Lymph nodes unable to be assessed due to obesity. Preop CT scan without lymphadenopathy.  - S/p rad onc referral. Plan for VBT.  On 06/06/2022. - Tumor board rec for POLE testing; pending. - Tumor board rec for genetics referral.  Referral placed. - Signs/symptoms of recurrence reviewed. - Surveillance reviewed.  RTC 3 months.  Clide Cliff, MD Gynecologic Oncology   ----------------------- Reason for Visit: Postop/Treatment counseling  Treatment History: Oncology History  Endometrial cancer (HCC)  02/15/2022 Initial Biopsy   Endometrial biopsy: FIGO grade 2 endometrioid adenocarcinoma   03/26/2022 Initial Diagnosis   Endometrial cancer (HCC)   04/12/2022 Surgery   Robotic-assisted total laparoscopic hysterectomy, bilateral salpingo-oophorectomy, bilateral sentinel lymph node evaluation (no biopsy due to morbid obesity and inability to visualize lymph channels/sentinel lymph node)   04/12/2022 Pathology Results   A. UTERUS WITH RIGHT AND LEFT FALLOPIAN TUBE AND OVARY, HYSTERECTOMY AND  BILATERAL SALPINGO-OOPHORECTOMY:  Invasive well to moderately differentiated endometrioid adenocarcinoma  with squamous morular metaplasia, FIGO 2  Tumor invades greater than 50% of the myometrium within the lower   uterine segment (7 of 11 mm, 64%) (pT1b)  Margins free  Bilateral acute and chronic salpingitis, proximally  Benign ovaries   An immunohistochemical stain for p53 performed on the biopsy  ([id number]) exhibited a wild-type staining pattern.   IHC EXPRESSION RESULTS  TEST           RESULT  MLH1:          Preserved nuclear expression  MSH2:          Preserved nuclear expression  MSH6:          Preserved nuclear expression  PMS2:          Preserved nuclear expression    04/12/2022 Cancer Staging   Staging form: Corpus Uteri - Carcinoma and Carcinosarcoma, AJCC 8th Edition - Pathologic stage from 04/12/2022: FIGO Stage IA, calculated as Stage Unknown (pT1a, pNX, cM0) - Signed by Clide Cliff, MD on 05/02/2022 Histopathologic type: Endometrioid adenocarcinoma, NOS Stage prefix: Initial diagnosis Histologic grade (G): G2 Histologic grading system: 3 grade system Lymph-vascular invasion (LVI): LVI not present (absent)/not identified     Interval History: Continues to do well.  Denies any bleeding or discharge.  Not having any pain.  Incisions improved with no more itching.   Past Medical/Surgical History: Past Medical History:  Diagnosis Date   Anemia    Anxiety    Aortic valve stenosis    Asthma    Cancer (HCC)    endometrial cancer   Chronic pulmonary hypertension (HCC)    Depression    Diabetes mellitus without complication (HCC)    GERD (gastroesophageal reflux disease)    Hypercholesteremia    Hypertension    Paroxysmal atrial fibrillation (HCC)    Sleep apnea     Past Surgical History:  Procedure Laterality Date   CARDIOVERSION N/A  12/30/2020   Procedure: CARDIOVERSION;  Surgeon: Lamar Blinks, MD;  Location: [facility] ORS;  Service: Cardiovascular;  Laterality: N/A;   CATARACT EXTRACTION Right 11/2021   CATARACT EXTRACTION Left 12/2021   COLONOSCOPY     ESOPHAGOGASTRODUODENOSCOPY (EGD) WITH PROPOFOL N/A 06/29/2021   Procedure: ESOPHAGOGASTRODUODENOSCOPY (EGD)  WITH PROPOFOL;  Surgeon: Midge Minium, MD;  Location: [facility] ENDOSCOPY;  Service: Endoscopy;  Laterality: N/A;   ROBOTIC ASSISTED TOTAL HYSTERECTOMY WITH BILATERAL SALPINGO OOPHERECTOMY Bilateral 04/12/2022   Procedure: XI ROBOTIC ASSISTED TOTAL HYSTERECTOMY WITH BILATERAL SALPINGO OOPHORECTOMY;  Surgeon: Clide Cliff, MD;  Location: [facility] ORS;  Service: Gynecology;  Laterality: Bilateral;   SENTINEL NODE BIOPSY N/A 04/12/2022   Procedure: SENTINEL NODE INJECTION;  Surgeon: Clide Cliff, MD;  Location: [facility] ORS;  Service: Gynecology;  Laterality: N/A;   TUBAL LIGATION  2000    Family History  Problem Relation Age of Onset   Ovarian cancer Mother        mid 49s   Lung cancer Mother 28   Throat cancer Maternal Grandmother    Colon cancer Neg Hx    Breast cancer Neg Hx    Endometrial cancer Neg Hx    Pancreatic cancer Neg Hx    Prostate cancer Neg Hx     Social History   Socioeconomic History   Marital status: Married    Spouse name: Not on file   Number of children: Not on file   Years of education: Not on file   Highest education level: Not on file  Occupational History   Not on file  Tobacco Use   Smoking status: Former    Types: Cigarettes    Passive exposure: Past   Smokeless tobacco: Never  Vaping Use   Vaping Use: Every day   Substances: Nicotine, Flavoring  Substance and Sexual Activity   Alcohol use: Not Currently    Comment: occ   Drug use: Never   Sexual activity: Yes    Birth control/protection: Surgical    Comment: Tubal Ligation  Other Topics Concern   Not on file  Social History Narrative   Not on file   Social Determinants of Health   Financial Resource Strain: Not on file  Food Insecurity: Patient Declined (03/26/2022)   Hunger Vital Sign    Worried About Running Out of Food in the Last Year: Patient declined    Ran Out of Food in the Last Year: Patient declined  Transportation Needs: Not on file  Physical Activity: Not on file  Stress: Not on  file  Social Connections: Not on file    Current Medications:  Current Outpatient Medications:    ACCU-CHEK SOFTCLIX LANCETS lancets, CHECK GLUCOSE TWICE DIALY FOR GLUCOSE MONITORING E11.9, Disp: , Rfl: 5   ADVAIR DISKUS 500-50 MCG/DOSE AEPB, Inhale 1 puff into the lungs in the morning and at bedtime., Disp: , Rfl: 5   amLODipine (NORVASC) 10 MG tablet, Take 1 tablet by mouth daily., Disp: , Rfl:    apixaban (ELIQUIS) 5 MG TABS tablet, Take 5 mg by mouth 2 (two) times daily., Disp: , Rfl:    atorvastatin (LIPITOR) 20 MG tablet, Take 20 mg by mouth daily., Disp: , Rfl:    buPROPion (WELLBUTRIN SR) 100 MG 12 hr tablet, Take 100 mg by mouth 2 (two) times daily., Disp: , Rfl:    dicyclomine (BENTYL) 20 MG tablet, Take 20 mg by mouth 2 (two) times daily. (Patient not taking: Reported on 05/05/2022), Disp: , Rfl:    enalapril (VASOTEC) 20  MG tablet, Take 20 mg by mouth daily., Disp: , Rfl:    ferrous sulfate 325 (65 FE) MG EC tablet, Take 650 mg by mouth daily with breakfast., Disp: , Rfl:    glucose blood test strip, USE AS DIRECTED TWICE A DAY, Disp: , Rfl:    Insulin Glargine (BASAGLAR KWIKPEN) 100 UNIT/ML, Inject 20 Units into the skin at bedtime as needed (if CBG over 200)., Disp: , Rfl:    JARDIANCE 25 MG TABS tablet, Take 25 mg by mouth daily., Disp: , Rfl:    metFORMIN (GLUCOPHAGE-XR) 500 MG 24 hr tablet, Take 1,000 mg by mouth 2 (two) times daily with a meal., Disp: , Rfl: 1   metoprolol tartrate (LOPRESSOR) 25 MG tablet, Take 12.5 mg by mouth 3 (three) times daily., Disp: , Rfl:    omeprazole (PRILOSEC) 40 MG capsule, Take 40 mg by mouth daily., Disp: , Rfl:   Review of Symptoms: Complete 10-system review is positive for: none  Physical Exam: LMP 10/06/2017 (Approximate) Comment: Pt did not have period for approx 4 years then started periods again this spring, irregular. neg preg test General: Alert, oriented, no acute distress. HEENT: Normocephalic, atraumatic. Neck symmetric without  masses. Sclera anicteric.  Chest: Normal work of breathing. Clear to auscultation bilaterally.   Cardiovascular: Regular rate and rhythm, no murmurs. Abdomen: Soft, nontender.  Normoactive bowel sounds.   Robotic incisions healing well with reduced in changes around incisions where fluid was. Extremities: Grossly normal range of motion.  Warm, well perfused.  No edema bilaterally. Chronic skin changes over both lower extremities Skin: Generalized erythema on vulva and groins. GU: External genitalia with some diffuse erythema.  Speculum exam with well-healing vaginal cuff and no abnormal discharge..  Bimanual exam reveals intact vaginal cuff, no induration or tenderness. Exam chaperoned by Kimberly [place], CMA   Laboratory & Radiologic Studies: A. 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

## 2022-05-23 ENCOUNTER — Inpatient Hospital Stay (HOSPITAL_BASED_OUTPATIENT_CLINIC_OR_DEPARTMENT_OTHER): Payer: 59 | Admitting: Psychiatry

## 2022-05-23 VITALS — BP 113/72 | HR 68 | Temp 97.7°F | Ht 64.0 in | Wt 317.0 lb

## 2022-05-23 DIAGNOSIS — C541 Malignant neoplasm of endometrium: Secondary | ICD-10-CM

## 2022-05-23 DIAGNOSIS — Z90722 Acquired absence of ovaries, bilateral: Secondary | ICD-10-CM

## 2022-05-23 DIAGNOSIS — Z90710 Acquired absence of both cervix and uterus: Secondary | ICD-10-CM

## 2022-05-23 NOTE — Patient Instructions (Signed)
It was a pleasure to see you in clinic today. - I have placed a referral to genetics - Return visit planned for 3 months. - Nothing in the vaginal until 10 weeks after surgery (4 more weeks).   Thank you very much for allowing me to provide care for you today.  I appreciate your confidence in choosing our Gynecologic Oncology team at [facility].  If you have any questions about your visit today please call our office or send [place] a MyChart message and we will get back to you as soon as possible.

## 2022-05-25 ENCOUNTER — Encounter: Payer: Self-pay | Admitting: Psychiatry

## 2022-05-25 DIAGNOSIS — F4322 Adjustment disorder with anxiety: Secondary | ICD-10-CM | POA: Insufficient documentation

## 2022-05-30 ENCOUNTER — Other Ambulatory Visit: Payer: Self-pay

## 2022-05-30 ENCOUNTER — Emergency Department
Admission: EM | Admit: 2022-05-30 | Discharge: 2022-05-31 | Disposition: A | Discharge status: Home / Self Care | Payer: 59 | Attending: Emergency Medicine | Admitting: Emergency Medicine

## 2022-05-30 DIAGNOSIS — R310 Gross hematuria: Secondary | ICD-10-CM | POA: Diagnosis not present

## 2022-05-30 DIAGNOSIS — R319 Hematuria, unspecified: Secondary | ICD-10-CM | POA: Diagnosis present

## 2022-05-30 DIAGNOSIS — Z8542 Personal history of malignant neoplasm of other parts of uterus: Secondary | ICD-10-CM | POA: Insufficient documentation

## 2022-05-30 DIAGNOSIS — N390 Urinary tract infection, site not specified: Secondary | ICD-10-CM

## 2022-05-30 LAB — COMPREHENSIVE METABOLIC PANEL
ALT: 26 U/L (ref 0–44)
AST: 25 U/L (ref 15–41)
Albumin: 3.7 g/dL (ref 3.5–5.0)
Alkaline Phosphatase: 113 U/L (ref 38–126)
Anion gap: 10 (ref 5–15)
BUN: 14 mg/dL (ref 6–20)
CO2: 24 mmol/L (ref 22–32)
Calcium: 9.2 mg/dL (ref 8.9–10.3)
Chloride: 103 mmol/L (ref 98–111)
Creatinine, Ser: 0.74 mg/dL (ref 0.44–1.00)
GFR, Estimated: >60 mL/min (ref >60)
Glucose, Bld: 119 mg/dL — ABNORMAL HIGH (ref 70–99)
Potassium: 3.9 mmol/L (ref 3.5–5.1)
Sodium: 137 mmol/L (ref 135–145)
Total Bilirubin: 0.6 mg/dL (ref 0.3–1.2)
Total Protein: 8 g/dL (ref 6.5–8.1)

## 2022-05-30 LAB — URINALYSIS, COMPLETE (UACMP) WITH MICROSCOPIC
RBC / HPF: >50 RBC/hpf (ref 0–5)
Specific Gravity, Urine: 1.039 — ABNORMAL HIGH (ref 1.005–1.030)
WBC, UA: >50 WBC/hpf (ref 0–5)

## 2022-05-30 LAB — CBC WITH DIFFERENTIAL/PLATELET
Abs Immature Granulocytes: 0.03 10*3/uL (ref 0.00–0.07)
Basophils Absolute: 0 10*3/uL (ref 0.0–0.1)
Basophils Relative: 0 %
Eosinophils Absolute: 0.2 10*3/uL (ref 0.0–0.5)
Eosinophils Relative: 2 %
HCT: 48 % — ABNORMAL HIGH (ref 36.0–46.0)
Hemoglobin: 14.8 g/dL (ref 12.0–15.0)
Immature Granulocytes: 0 %
Lymphocytes Relative: 23 %
Lymphs Abs: 2.5 10*3/uL (ref 0.7–4.0)
MCH: 27 pg (ref 26.0–34.0)
MCHC: 30.8 g/dL (ref 30.0–36.0)
MCV: 87.6 fL (ref 80.0–100.0)
Monocytes Absolute: 0.6 10*3/uL (ref 0.1–1.0)
Monocytes Relative: 5 %
Neutro Abs: 7.2 10*3/uL (ref 1.7–7.7)
Neutrophils Relative %: 70 %
Platelets: 277 10*3/uL (ref 150–400)
RBC: 5.48 MIL/uL — ABNORMAL HIGH (ref 3.87–5.11)
RDW: 14.7 % (ref 11.5–15.5)
WBC: 10.5 10*3/uL (ref 4.0–10.5)
nRBC: 0 % (ref 0.0–0.2)

## 2022-05-30 LAB — PROTIME-INR
INR: 1.2 (ref 0.8–1.2)
Prothrombin Time: 15 seconds (ref 11.4–15.2)

## 2022-05-30 MED ORDER — ONDANSETRON HCL 4 MG/2ML IJ SOLN
4.0000 mg | Freq: Once | INTRAMUSCULAR | Status: AC
  Administered 2022-05-31: 4 mg via INTRAVENOUS
  Filled 2022-05-30: qty 2

## 2022-05-30 MED ORDER — MORPHINE SULFATE (PF) 4 MG/ML IV SOLN
4.0000 mg | Freq: Once | INTRAVENOUS | Status: DC
  Filled 2022-05-30: qty 1

## 2022-05-30 NOTE — ED Provider Notes (Signed)
[facility] Provider Note  Patient Contact: 11:38 PM (approximate)   History   Hematuria   HPI  Joan Graves is a 52 y.o. female who presents the emergency department complaining of hematuria.  Patient states that she went to the bathroom tonight, started to pee frank blood.  Patient states that she is having some discomfort in her pelvic region.  She denies this being frank pain but states that it feels "sore."  Patient denies any fevers, chills, dysuria.  Of note patient states that she recently had a hysterectomy due to endometrial cancer.  This hysterectomy was performed at [facility] and occurred roughly 6 weeks ago.  Patient states that she was told that she could have some vaginal bleeding or even discharge after the surgery but did not have any complications.  When trying to clarify whether patient had vaginal bleeding or hematuria, patient states that she is only seeing blood when she urinates however she says that she has blood leaking and soaking her close.  She is unable to differentiate whether this is vaginal or urinated blood.     Physical Exam   Triage Vital Signs: ED Triage Vitals  Enc Vitals Group     BP 05/30/22 2122 (!) 140/98     Pulse Rate 05/30/22 2122 93     Resp 05/30/22 2122 18     Temp 05/30/22 2122 97.7 F (36.5 C)     Temp src --      SpO2 05/30/22 2122 99 %     Weight 05/30/22 2120 (!) 319 lb 10.7 oz (145 kg)     Height 05/30/22 2120 5\' 4"  (1.626 m)     Head Circumference --      Peak Flow --      Pain Score 05/30/22 2121 0     Pain Loc --      Pain Edu? --      Excl. in [place]? --     Most recent vital signs: Vitals:   05/30/22 2122  BP: (!) 140/98  Pulse: 93  Resp: 18  Temp: 97.7 F (36.5 C)  SpO2: 99%     General: Alert and in no acute distress.  Cardiovascular:  Good peripheral perfusion Respiratory: Normal respiratory effort without tachypnea or retractions. Lungs CTAB. Good air entry to  the bases with no decreased or absent breath sounds Gastrointestinal: Bowel sounds 4 quadrants. Soft and nontender to palpation. No guarding or rigidity. No palpable masses. No distention. No CVA tenderness. Genitourinary: Attempted pelvic exam was largely unsuccessful both due to patient's positioning, habitus as well as and tolerating the pain of speculum.  However during this exam it was determined that patient did have vaginal bleeding.  There is no active hemorrhage but blood is noted around the vaginal opening. Musculoskeletal: Full range of motion to all extremities.  Neurologic:  No gross focal neurologic deficits are appreciated.  Skin:   No rash noted Other:   ED Results / Procedures / Treatments   Labs (all labs ordered are listed, but only abnormal results are displayed) Labs Reviewed  URINALYSIS, COMPLETE (UACMP) WITH MICROSCOPIC - Abnormal; Notable for the following components:      Result Value   Color, Urine RED (*)    APPearance TURBID (*)    Specific Gravity, Urine 1.039 (*)    Glucose, UA   (*)    Value: TEST NOT REPORTED DUE TO COLOR INTERFERENCE OF URINE PIGMENT   Hgb urine dipstick   (*)  Value: TEST NOT REPORTED DUE TO COLOR INTERFERENCE OF URINE PIGMENT   Bilirubin Urine   (*)    Value: TEST NOT REPORTED DUE TO COLOR INTERFERENCE OF URINE PIGMENT   Ketones, ur   (*)    Value: TEST NOT REPORTED DUE TO COLOR INTERFERENCE OF URINE PIGMENT   Protein, ur   (*)    Value: TEST NOT REPORTED DUE TO COLOR INTERFERENCE OF URINE PIGMENT   Nitrite   (*)    Value: TEST NOT REPORTED DUE TO COLOR INTERFERENCE OF URINE PIGMENT   Leukocytes,Ua   (*)    Value: TEST NOT REPORTED DUE TO COLOR INTERFERENCE OF URINE PIGMENT   Bacteria, UA MANY (*)    All other components within normal limits  COMPREHENSIVE METABOLIC PANEL - Abnormal; Notable for the following components:   Glucose, Bld 119 (*)    All other components within normal limits  CBC WITH DIFFERENTIAL/PLATELET -  Abnormal; Notable for the following components:   RBC 5.48 (*)    HCT 48.0 (*)    All other components within normal limits  URINE CULTURE  WET PREP, GENITAL  PROTIME-INR  TYPE AND SCREEN     EKG     RADIOLOGY    No results found.  PROCEDURES:  Critical Care performed: No  Procedures   MEDICATIONS ORDERED IN ED: Medications  morphine (PF) 4 MG/ML injection 4 mg (has no administration in time range)  ondansetron (ZOFRAN) injection 4 mg (has no administration in time range)     IMPRESSION / MDM / ASSESSMENT AND PLAN / ED COURSE  I reviewed the triage vital signs and the nursing notes.                                 Differential diagnosis includes, but is not limited to, hematuria, UTI, nephrolithiasis, vaginal bleeding  Patient's presentation is most consistent with acute presentation with potential threat to life or bodily function.  Patient presented with reports of hematuria starting this evening.  Patient states that she went to the restroom, noticed blood in the toilet.  Patient states that the blood is only present during urination however on further questioning it appears that patient had some bleeding that was soaking into her close.  At this time she informing that she is a cancer patient having had endometrial cancer with a total hysterectomy and oophorectomy.  This procedure occurred roughly 6 weeks ago at [facility].  She is followed by a gynecologist oncologist.  At this time I attempted a pelvic exam and patient was unable to tolerate due to positioning, habitus as well as pain.  However I was able to determine that there was bleeding from the vaginal canal.  At this time I have ordered labs, CT scan.  At shift change I will handover to attending provider, Dr. Leonides Schanz.  Patient will likely need analgesics and repeat of the pelvic exam for better visualization.  Given her history, bleeding suspect patient may require admission but again final diagnosis and  disposition will be provided by attending provider Dr. Leonides Schanz.    Note:  This document was prepared using Dragon voice recognition software and may include unintentional dictation errors.   Darletta Moll, PA-C 05/31/22 0000    Ward, Delice Bison, DO 05/31/22 [id number]

## 2022-05-30 NOTE — ED Notes (Signed)
Urine sample collected, gross blood noted.

## 2022-05-30 NOTE — Progress Notes (Signed)
Joan Graves is here today      Does the patient complain of any of the following:  Pain:None Abdominal bloating:  None Diarrhea/Constipation: None Nausea/Vomiting: None Vaginal Discharge: None Blood in Urine or Stool: None Urinary Issues (dysuria/incomplete emptying/ incontinence/ increased frequency/urgency): None Vitals:   06/06/22 0956  BP: 112/72  Pulse: 71  Resp: 20  Temp: (!) 97.1 F (36.2 C)  TempSrc: Temporal  SpO2: 100%  Weight: (!) 146.6 kg  Height: 5\' 4"  (1.626 m)      Additional comments if applicable:

## 2022-05-30 NOTE — ED Triage Notes (Signed)
Pt sts she noticed tonight that she had blood in her urine. Denies any pain.

## 2022-05-30 NOTE — ED Provider Notes (Incomplete)
11:44 PM  Assumed care at shift change.

## 2022-05-31 ENCOUNTER — Emergency Department: Payer: 59

## 2022-05-31 ENCOUNTER — Encounter: Payer: Self-pay | Admitting: Psychiatry

## 2022-05-31 ENCOUNTER — Telehealth: Payer: Self-pay

## 2022-05-31 DIAGNOSIS — R310 Gross hematuria: Secondary | ICD-10-CM | POA: Diagnosis not present

## 2022-05-31 LAB — CHLAMYDIA/NGC RT PCR (ARMC ONLY)
Chlamydia Tr: NOT DETECTED
N gonorrhoeae: NOT DETECTED

## 2022-05-31 LAB — WET PREP, GENITAL
Clue Cells Wet Prep HPF POC: NONE SEEN
Sperm: NONE SEEN
Trich, Wet Prep: NONE SEEN
WBC, Wet Prep HPF POC: >=10 — AB (ref <10)
Yeast Wet Prep HPF POC: NONE SEEN

## 2022-05-31 LAB — TYPE AND SCREEN

## 2022-05-31 MED ORDER — KETOROLAC TROMETHAMINE 30 MG/ML IJ SOLN
30.0000 mg | Freq: Once | INTRAMUSCULAR | Status: AC
  Administered 2022-05-31: 30 mg via INTRAVENOUS
  Filled 2022-05-31: qty 1

## 2022-05-31 MED ORDER — CEFTRIAXONE SODIUM 1 G IJ SOLR
1.0000 g | Freq: Once | INTRAMUSCULAR | Status: AC
  Administered 2022-05-31: 1 g via INTRAMUSCULAR
  Filled 2022-05-31: qty 10

## 2022-05-31 MED ORDER — IOHEXOL 350 MG/ML SOLN
100.0000 mL | Freq: Once | INTRAVENOUS | Status: AC | PRN
  Administered 2022-05-31: 100 mL via INTRAVENOUS

## 2022-05-31 MED ORDER — CEFDINIR 300 MG PO CAPS: 300.0000 mg | ORAL_CAPSULE | Freq: Two times a day (BID) | ORAL | 0 refills | Status: DC

## 2022-05-31 MED ORDER — SODIUM CHLORIDE 0.9 % IV SOLN: 2.0000 g | Freq: Once | INTRAVENOUS | Status: DC

## 2022-05-31 MED ORDER — LIDOCAINE HCL 1 % IJ SOLN
INTRAMUSCULAR | Status: AC
  Filled 2022-05-31: qty 10

## 2022-05-31 NOTE — Telephone Encounter (Signed)
Office received after hour telephone advise record. Pt called stating she was having blood in her urine. No pain with urination or abdominal pain. Recent hysterectomy in February 2024.   Pt was seen in the ER, no follow- up at this time.

## 2022-05-31 NOTE — Discharge Instructions (Addendum)
Please call and schedule an appointment for close follow-up with your gynecology oncologist and your PCP if symptoms or not improving.  You had no signs of vaginal bleeding on clinical exam today but did have signs of urinary tract infection which we are treating.

## 2022-06-02 LAB — URINE CULTURE: Culture: >=100000 — AB

## 2022-06-03 ENCOUNTER — Telehealth: Payer: Self-pay | Admitting: *Deleted

## 2022-06-03 NOTE — Telephone Encounter (Signed)
CALLED PATIENT TO REMIND OF NEW [facility]DR [facility] FOR 06-06-22, LVM FOR A RETURN CALL

## 2022-06-05 NOTE — Progress Notes (Signed)
Radiation Oncology         (336) [phone] ________________________________  Name: Joan Graves MRN: [id number]  Date: 06/06/2022  DOB: [birth year]  Vaginal Brachytherapy Procedure Note  CC: [facility] Clide Cliff, MD  No diagnosis found.  Diagnosis: The encounter diagnosis was Endometrial cancer (HCC).   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 ***  Since her consultation date, the patient followed up with Dr .Alvester Morin on 05/23/22. Pelvic exam performed during this visit showed resolution of her vaginal cuff cellulitis, and the patient denied any new concerns.   In the interval, the patient presented to the ED on 05/30/22 for evaluation of hematuria and soreness to the pelvic region. Per encounter notes, when trying to clarify whether the patient had vaginal bleeding or hematuria, the patient stated that she only saw blood when urinating, however she also noted blood on her undergarments prior to urinating. The patient was unable to tolerate a pelvic exam due to positioning, body habitus, as well as pain. However, some blood in the vaginal canal was appreciated. On repeat pelvic exam no signs of vaginal bleeding were appreciated. CT AP performed in the ED showed no acute intra-abdominal or pelvic pathology. Urinalysis showed large amounts of white blood cells, red blood cells, and bacteria, as well as many squamous cells. In the setting of a suspected UTI, she was given a dose of Rocephin prior to discharge, and discharged with cefdinir with instructions to follow-up with oncology.   Her urine culture later came back positive for e-coli.   ***  ALLERGIES: is allergic to aspirin.  Meds: Current Outpatient Medications  Medication Sig Dispense Refill   ACCU-CHEK SOFTCLIX LANCETS  lancets CHECK GLUCOSE TWICE DIALY FOR GLUCOSE MONITORING E11.9  5   ADVAIR DISKUS 500-50 MCG/DOSE AEPB Inhale 1 puff into the lungs in the morning and at bedtime.  5   amLODipine (NORVASC) 10 MG tablet Take 1 tablet by mouth daily.     apixaban (ELIQUIS) 5 MG TABS tablet Take 5 mg by mouth 2 (two) times daily.     atorvastatin (LIPITOR) 20 MG tablet Take 20 mg by mouth daily.     buPROPion (WELLBUTRIN SR) 100 MG 12 hr tablet Take 100 mg by mouth 2 (two) times daily.     cefdinir (OMNICEF) 300 MG capsule Take 1 capsule (300 mg total) by mouth 2 (two) times daily. 14 capsule 0   dicyclomine (BENTYL) 20 MG tablet Take 20 mg by mouth 2 (two) times daily.     enalapril (VASOTEC) 20 MG tablet Take 20 mg by mouth daily.     ferrous sulfate 325 (65 FE) MG EC tablet Take 650 mg by mouth daily with breakfast.     glucose blood test strip USE AS DIRECTED TWICE A DAY     Insulin Glargine (BASAGLAR KWIKPEN) 100 UNIT/ML Inject 20 Units into the skin at bedtime as needed (if CBG over 200).     JARDIANCE 25 MG TABS tablet Take 25 mg by mouth daily.     metFORMIN (GLUCOPHAGE-XR) 500 MG 24 hr tablet Take 1,000 mg by mouth 2 (two) times daily with a meal.  1   metoprolol tartrate (LOPRESSOR) 25 MG tablet Take 12.5 mg by mouth 3 (three) times daily.     omeprazole (PRILOSEC) 40 MG capsule Take 40 mg by mouth daily.  No current facility-administered medications for this encounter.    Physical Findings: The patient is in no acute distress. Patient is alert and oriented.  vitals were not taken for this visit.   No palpable cervical, supraclavicular or axillary lymphoadenopathy. The heart has a regular rate and rhythm. The lungs are clear to auscultation. Abdomen soft and non-tender.  On pelvic examination the external genitalia were unremarkable. A speculum exam was performed. Vaginal cuff intact, no mucosal lesions. On bimanual exam there were no pelvic masses appreciated.  Lab Findings: Lab Results   Component Value Date   WBC 10.5 05/30/2022   HGB 14.8 05/30/2022   HCT 48.0 (H) 05/30/2022   MCV 87.6 05/30/2022   PLT 277 05/30/2022    Radiographic Findings: CT ABDOMEN PELVIS W CONTRAST  Result Date: 05/31/2022 CLINICAL DATA:  Abdominal pain. EXAM: CT ABDOMEN AND PELVIS WITH CONTRAST TECHNIQUE: Multidetector CT imaging of the abdomen and pelvis was performed using the standard protocol following bolus administration of intravenous contrast. RADIATION DOSE REDUCTION: This exam was performed according to the departmental dose-optimization program which includes automated exposure control, adjustment of the mA and/or kV according to patient size and/or use of iterative reconstruction technique. CONTRAST:  OMNIPAQUE IOHEXOL 350 MG/ML SOLN COMPARISON:  CT abdomen pelvis dated 03/15/2022. FINDINGS: Lower chest: The visualized lung bases are clear. There is coronary vascular calcification. No intra-abdominal free air or free fluid. Hepatobiliary: The liver is unremarkable. No dilatation. The gallbladder is unremarkable. Pancreas: Unremarkable. No pancreatic ductal dilatation or surrounding inflammatory changes. Spleen: Normal in size without focal abnormality. Adrenals/Urinary Tract: The adrenal glands are unremarkable. The kidneys, visualized ureters, and urinary bladder appear unremarkable. Stomach/Bowel: There is no bowel obstruction or active inflammation. The appendix is normal. Vascular/Lymphatic: The abdominal aorta and IVC unremarkable. No portal venous gas. There is no adenopathy. Reproductive: Hysterectomy.  No adnexal masses. Other: Small fat containing umbilical hernia. Musculoskeletal: Bilateral L5 pars defects. No listhesis. No acute osseous pathology. IMPRESSION: 1. No acute intra-abdominal or pelvic pathology. 2. Bilateral L5 pars defects. No listhesis. Electronically Signed   By: Elgie Collard M.D.   On: 05/31/2022 00:55    Impression: The encounter diagnosis was Endometrial  cancer (HCC).   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 The patient will be treated with a *** cm diameter cylinder with a treatment length of *** cm. This distended the vaginal vault without undue discomfort. The patient tolerated the procedure well.  The patient was successfully fitted for a vaginal cylinder. The patient is appropriate to begin vaginal brachytherapy.   Plan: The patient will proceed with CT simulation and vaginal brachytherapy today.    _______________________________   Billie Lade, PhD, MD  This document serves as a record of services personally performed by Antony Blackbird, MD. It was created on his behalf by Neena Rhymes, a trained medical scribe. The creation of this record is based on the scribe's personal observations and the provider's statements to them. This document has been checked and approved by the attending provider.

## 2022-06-06 ENCOUNTER — Ambulatory Visit
Admission: RE | Admit: 2022-06-06 | Discharge: 2022-06-06 | Disposition: A | Discharge status: Home / Self Care | Payer: 59 | Source: Ambulatory Visit | Attending: Radiation Oncology | Admitting: Radiation Oncology

## 2022-06-06 ENCOUNTER — Ambulatory Visit: Admission: RE | Admit: 2022-06-06 | Payer: 59 | Source: Ambulatory Visit | Admitting: Radiation Oncology

## 2022-06-06 ENCOUNTER — Encounter: Payer: Self-pay | Admitting: Radiation Oncology

## 2022-06-06 VITALS — BP 112/72 | HR 71 | Temp 97.1°F | Resp 20 | Ht 64.0 in | Wt 323.1 lb

## 2022-06-06 DIAGNOSIS — Z79899 Other long term (current) drug therapy: Secondary | ICD-10-CM | POA: Diagnosis not present

## 2022-06-06 DIAGNOSIS — Z90710 Acquired absence of both cervix and uterus: Secondary | ICD-10-CM | POA: Insufficient documentation

## 2022-06-06 DIAGNOSIS — Z7984 Long term (current) use of oral hypoglycemic drugs: Secondary | ICD-10-CM | POA: Insufficient documentation

## 2022-06-06 DIAGNOSIS — Z90722 Acquired absence of ovaries, bilateral: Secondary | ICD-10-CM | POA: Insufficient documentation

## 2022-06-06 DIAGNOSIS — Z7901 Long term (current) use of anticoagulants: Secondary | ICD-10-CM | POA: Insufficient documentation

## 2022-06-06 DIAGNOSIS — C541 Malignant neoplasm of endometrium: Secondary | ICD-10-CM | POA: Diagnosis present

## 2022-06-06 DIAGNOSIS — K429 Umbilical hernia without obstruction or gangrene: Secondary | ICD-10-CM | POA: Diagnosis not present

## 2022-06-06 MED ORDER — FLUCONAZOLE 150 MG PO TABS: 150.0000 mg | ORAL_TABLET | Freq: Every day | ORAL | 0 refills | Status: DC

## 2022-06-07 ENCOUNTER — Telehealth: Payer: Self-pay | Admitting: Genetic Counselor

## 2022-06-07 NOTE — Telephone Encounter (Signed)
Patient called to cancel 5/8 appointment due to lack of transportation. Patient stated will call back at later date to reschedule based on transportation.

## 2022-06-10 ENCOUNTER — Telehealth: Payer: Self-pay | Admitting: *Deleted

## 2022-06-10 NOTE — Telephone Encounter (Signed)
CALLED PATIENT TO REMIND OF APPTS. FOR--06-13-22- ARRIVAL TIME- 10:15 AM @ [facility], LVM FOR A RETURN CALL

## 2022-06-11 NOTE — Progress Notes (Signed)
  Radiation Oncology         (336) [phone] ________________________________  Name: Joan Graves MRN: [id number]  Date: 06/13/2022  DOB: 1971-01-31  CC: [facility]  Clide Cliff, MD  HDR BRACHYTHERAPY NOTE  DIAGNOSIS: The encounter diagnosis was Endometrial cancer [facility]).   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 She will be treated with a 2.5 cm diameter segmented cylinder. This conforms to her anatomy without undue discomfort.  Vaginal brachytherapy procedure node: The patient was brought to the HDR suite. Identity was confirmed. All relevant records and images related to the planned course of therapy were reviewed. The patient freely provided informed written consent to proceed with treatment after reviewing the details related to the planned course of therapy. The consent form was witnessed and verified by the simulation staff. Then, the patient was set-up in a stable reproducible supine position for radiation therapy. Pelvic exam revealed the vaginal cuff to be intact . The patient's custom vaginal cylinder was placed in the proximal vagina. This was affixed to the CT/MR stabilization plate to prevent slippage. Patient tolerated the placement well.  Verification simulation note:  A fiducial marker was placed within the vaginal cylinder. An AP and lateral film was then obtained through the pelvis area. This documented accurate position of the vaginal cylinder for treatment.  HDR BRACHYTHERAPY TREATMENT  The remote afterloading device was affixed to the vaginal cylinder by catheter. Patient then proceeded to undergo her first high-dose-rate treatment directed at the proximal vagina. The patient was prescribed a dose of 6.0 gray to be delivered to the mucosal surface. Treatment length  was 3.0 cm. Patient was treated with 1 channel using 7 dwell positions. Treatment time was 144.4 seconds. Iridium 192 was the high-dose-rate source for treatment. The patient tolerated the treatment well. After completion of her therapy, a radiation survey was performed documenting return of the iridium source into the [employer] safe.   PLAN: The patient will return next week for her second high-dose-rate treatment.  ________________________________  -----------------------------------  Billie Lade, PhD, MD  This document serves as a record of services personally performed by Antony Blackbird, MD. It was created on his behalf by Neena Rhymes, a trained medical scribe. The creation of this record is based on the scribe's personal observations and the provider's statements to them. This document has been checked and approved by the attending provider.'

## 2022-06-11 NOTE — Progress Notes (Signed)
Radiation Oncology         [phone]) [phone] ________________________________  Name: Joan Graves MRN: [id number]  Date: 06/13/2022  DOB: 1970-11-15  Vaginal Brachytherapy Procedure Note  CC: [facility] Clide Cliff, MD  No diagnosis found.  Diagnosis: The encounter diagnosis was Endometrial cancer (HCC).   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 To review, the patient was initially planned to initiate brachytherapy on 06/06/22. However, she did have a significant candidal infection at that time, and Dr. Alvester Morin recommended delaying initiation of radiation therapy for 1 week.  ***  ALLERGIES: is allergic to aspirin.  Meds: Current Outpatient Medications  Medication Sig Dispense Refill   ACCU-CHEK SOFTCLIX LANCETS lancets CHECK GLUCOSE TWICE DIALY FOR GLUCOSE MONITORING E11.9  5   ADVAIR DISKUS 500-50 MCG/DOSE AEPB Inhale 1 puff into the lungs in the morning and at bedtime.  5   amLODipine (NORVASC) 10 MG tablet Take 1 tablet by mouth daily.     apixaban (ELIQUIS) 5 MG TABS tablet Take 5 mg by mouth 2 (two) times daily.     atorvastatin (LIPITOR) 20 MG tablet Take 20 mg by mouth daily.     buPROPion (WELLBUTRIN SR) 100 MG 12 hr tablet Take 100 mg by mouth 2 (two) times daily.     cefdinir (OMNICEF) 300 MG capsule Take 1 capsule (300 mg total) by mouth 2 (two) times daily. (Patient not taking: Reported on 06/06/2022) 14 capsule 0   dicyclomine (BENTYL) 20 MG tablet Take 20 mg by mouth 2 (two) times daily.     enalapril (VASOTEC) 20 MG tablet Take 20 mg by mouth daily.     ferrous sulfate 325 (65 FE) MG EC tablet Take 650 mg by mouth daily with breakfast.     fluconazole (DIFLUCAN) 150 MG tablet Take 1 tablet (150 mg total) by mouth daily. Take second tablet 72 hours later 2 tablet 0   glucose blood test strip  USE AS DIRECTED TWICE A DAY     Insulin Glargine (BASAGLAR KWIKPEN) 100 UNIT/ML Inject 20 Units into the skin at bedtime as needed (if CBG over 200).     JARDIANCE 25 MG TABS tablet Take 25 mg by mouth daily.     metFORMIN (GLUCOPHAGE-XR) 500 MG 24 hr tablet Take 1,000 mg by mouth 2 (two) times daily with a meal.  1   metoprolol tartrate (LOPRESSOR) 25 MG tablet Take 12.5 mg by mouth 3 (three) times daily.     omeprazole (PRILOSEC) 40 MG capsule Take 40 mg by mouth daily.     No current facility-administered medications for this encounter.    Physical Findings: The patient is in no acute distress. Patient is alert and oriented.  vitals were not taken for this visit.   No palpable cervical, supraclavicular or axillary lymphoadenopathy. The heart has a regular rate and rhythm. The lungs are clear to auscultation. Abdomen soft and non-tender.  On pelvic examination the external genitalia were unremarkable. A speculum exam was performed. There are no mucosal lesions noted in the vaginal vault. A Pap smear was obtained of the proximal vagina. On bimanual and rectovaginal examination there were no pelvic masses appreciated. ***   Lab Findings: Lab Results  Component Value Date   WBC 10.5 05/30/2022   HGB 14.8 05/30/2022   HCT 48.0 (H) 05/30/2022   MCV 87.6 05/30/2022   PLT  277 05/30/2022    Radiographic Findings: CT ABDOMEN PELVIS W CONTRAST  Result Date: 05/31/2022 CLINICAL DATA:  Abdominal pain. EXAM: CT ABDOMEN AND PELVIS WITH CONTRAST TECHNIQUE: Multidetector CT imaging of the abdomen and pelvis was performed using the standard protocol following bolus administration of intravenous contrast. RADIATION DOSE REDUCTION: This exam was performed according to the departmental dose-optimization program which includes automated exposure control, adjustment of the mA and/or kV according to patient size and/or use of iterative reconstruction technique. CONTRAST:  OMNIPAQUE IOHEXOL 350 MG/ML  SOLN COMPARISON:  CT abdomen pelvis dated 03/15/2022. FINDINGS: Lower chest: The visualized lung bases are clear. There is coronary vascular calcification. No intra-abdominal free air or free fluid. Hepatobiliary: The liver is unremarkable. No dilatation. The gallbladder is unremarkable. Pancreas: Unremarkable. No pancreatic ductal dilatation or surrounding inflammatory changes. Spleen: Normal in size without focal abnormality. Adrenals/Urinary Tract: The adrenal glands are unremarkable. The kidneys, visualized ureters, and urinary bladder appear unremarkable. Stomach/Bowel: There is no bowel obstruction or active inflammation. The appendix is normal. Vascular/Lymphatic: The abdominal aorta and IVC unremarkable. No portal venous gas. There is no adenopathy. Reproductive: Hysterectomy.  No adnexal masses. Other: Small fat containing umbilical hernia. Musculoskeletal: Bilateral L5 pars defects. No listhesis. No acute osseous pathology. IMPRESSION: 1. No acute intra-abdominal or pelvic pathology. 2. Bilateral L5 pars defects. No listhesis. Electronically Signed   By: Elgie Collard M.D.   On: 05/31/2022 00:55    Impression: The encounter diagnosis was Endometrial cancer (HCC).   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 The patient will be treated with a *** cm diameter cylinder with a treatment length of *** cm. This distended the vaginal vault without undue discomfort. The patient tolerated the procedure well.  The patient was successfully fitted for a vaginal cylinder. The patient is appropriate to begin vaginal brachytherapy.   Plan: The patient will proceed with CT simulation and vaginal brachytherapy today.    _______________________________   Billie Lade, PhD, MD  This document serves as a record of services personally performed by Antony Blackbird, MD. It was created on  his behalf by Neena Rhymes, a trained medical scribe. The creation of this record is based on the scribe's personal observations and the provider's statements to them. This document has been checked and approved by the attending provider.

## 2022-06-13 ENCOUNTER — Other Ambulatory Visit: Payer: Self-pay

## 2022-06-13 ENCOUNTER — Ambulatory Visit
Admission: RE | Admit: 2022-06-13 | Discharge: 2022-06-13 | Disposition: A | Discharge status: Home / Self Care | Payer: 59 | Source: Ambulatory Visit | Attending: Radiation Oncology | Admitting: Radiation Oncology

## 2022-06-13 ENCOUNTER — Encounter: Payer: Self-pay | Admitting: Radiation Oncology

## 2022-06-13 VITALS — BP 114/78 | HR 86 | Temp 96.9°F | Resp 18 | Ht 64.0 in | Wt 322.2 lb

## 2022-06-13 DIAGNOSIS — K429 Umbilical hernia without obstruction or gangrene: Secondary | ICD-10-CM | POA: Insufficient documentation

## 2022-06-13 DIAGNOSIS — Z7901 Long term (current) use of anticoagulants: Secondary | ICD-10-CM | POA: Diagnosis not present

## 2022-06-13 DIAGNOSIS — C541 Malignant neoplasm of endometrium: Secondary | ICD-10-CM

## 2022-06-13 DIAGNOSIS — Z90710 Acquired absence of both cervix and uterus: Secondary | ICD-10-CM | POA: Insufficient documentation

## 2022-06-13 DIAGNOSIS — B3749 Other urogenital candidiasis: Secondary | ICD-10-CM | POA: Insufficient documentation

## 2022-06-13 DIAGNOSIS — Z90722 Acquired absence of ovaries, bilateral: Secondary | ICD-10-CM | POA: Insufficient documentation

## 2022-06-13 DIAGNOSIS — Z79899 Other long term (current) drug therapy: Secondary | ICD-10-CM | POA: Diagnosis not present

## 2022-06-13 DIAGNOSIS — Z7984 Long term (current) use of oral hypoglycemic drugs: Secondary | ICD-10-CM | POA: Insufficient documentation

## 2022-06-13 LAB — RAD ONC ARIA SESSION SUMMARY
Course Elapsed Days: 0
Plan Fractions Treated to Date: 1
Plan Prescribed Dose Per Fraction: 6 Gy
Plan Total Fractions Prescribed: 5
Plan Total Prescribed Dose: 30 Gy
Reference Point Dosage Given to Date: 6 Gy
Reference Point Session Dosage Given: 6 Gy
Session Number: 1

## 2022-06-13 NOTE — Progress Notes (Signed)
Joan Graves is here today for follow up post radiation to the pelvic.    Does the patient complain of any of the following:  Pain: Denies Abdominal bloating:  denies Diarrhea/Constipation:  denies Nausea/Vomiting:  denies Vaginal Discharge:  denies Blood in Urine or Stool: denies Urinary Issues (dysuria/incomplete emptying/ incontinence/ increased frequency/urgency): denies  Vitals:   06/13/22 1019  BP: 114/78  Pulse: 86  Resp: 18  Temp: (!) 96.9 F (36.1 C)  TempSrc: Temporal  SpO2: 100%  Weight: (!) 146.2 kg  Height: 5\' 4"  (1.626 m)      Additional comments if applicable:

## 2022-06-15 ENCOUNTER — Telehealth: Payer: Self-pay | Admitting: *Deleted

## 2022-06-15 NOTE — Telephone Encounter (Signed)
XXXX 

## 2022-06-15 NOTE — Telephone Encounter (Signed)
CALLED PATIENT TO REMIND OF HDR TX. FOR 06-20-22 @ 10 AM, SPOKE WITH PATIENT AND SHE IS AWARE OF THIS TX.

## 2022-06-19 NOTE — Progress Notes (Signed)
  Radiation Oncology         [phone]) [phone] ________________________________  Name: EMREE LOCICERO MRN: [id number]  Date: 06/20/2022  DOB: [birth year]  CC: [facility]  Clide Cliff, MD  HDR BRACHYTHERAPY NOTE  DIAGNOSIS: The encounter diagnosis was Endometrial cancer [facility]).   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 She will be treated with a 2.5 cm diameter segmented cylinder. This conforms to her anatomy without undue discomfort.  Vaginal brachytherapy procedure node: The patient was brought to the HDR suite. Identity was confirmed. All relevant records and images related to the planned course of therapy were reviewed. The patient freely provided informed written consent to proceed with treatment after reviewing the details related to the planned course of therapy. The consent form was witnessed and verified by the simulation staff. Then, the patient was set-up in a stable reproducible supine position for radiation therapy. Pelvic exam revealed the vaginal cuff to be intact . The patient's custom vaginal cylinder was placed in the proximal vagina. This was affixed to the CT/MR stabilization plate to prevent slippage. Patient tolerated the placement well.  Verification simulation note:  A fiducial marker was placed within the vaginal cylinder. An AP and lateral film was then obtained through the pelvis area. This documented accurate position of the vaginal cylinder for treatment.  HDR BRACHYTHERAPY TREATMENT  The remote afterloading device was affixed to the vaginal cylinder by catheter. Patient then proceeded to undergo her second high-dose-rate treatment directed at the proximal vagina. The patient was prescribed a dose of 6.0 gray to be delivered to the mucosal surface. Treatment length  was 3.0 cm. Patient was treated with 1 channel using 7 dwell positions. Treatment time was 154.1 seconds. Iridium 192 was the high-dose-rate source for treatment. The patient tolerated the treatment well. After completion of her therapy, a radiation survey was performed documenting return of the iridium source into the GammaMed safe.   PLAN: The patient will return next week for her third high-dose-rate treatment.  ________________________________  -----------------------------------  Billie Lade, PhD, MD  This document serves as a record of services personally performed by Antony Blackbird, MD. It was created on his behalf by Neena Rhymes, a trained medical scribe. The creation of this record is based on the scribe's personal observations and the provider's statements to them. This document has been checked and approved by the attending provider.'

## 2022-06-20 ENCOUNTER — Ambulatory Visit
Admission: RE | Admit: 2022-06-20 | Discharge: 2022-06-20 | Disposition: A | Discharge status: Home / Self Care | Payer: 59 | Source: Ambulatory Visit | Attending: Radiation Oncology | Admitting: Radiation Oncology

## 2022-06-20 ENCOUNTER — Other Ambulatory Visit: Payer: Self-pay

## 2022-06-20 DIAGNOSIS — C541 Malignant neoplasm of endometrium: Secondary | ICD-10-CM

## 2022-06-20 LAB — RAD ONC ARIA SESSION SUMMARY
Course Elapsed Days: 7
Plan Fractions Treated to Date: 2
Plan Prescribed Dose Per Fraction: 6 Gy
Plan Total Fractions Prescribed: 5
Plan Total Prescribed Dose: 30 Gy
Reference Point Dosage Given to Date: 12 Gy
Reference Point Session Dosage Given: 6 Gy
Session Number: 2

## 2022-06-21 ENCOUNTER — Ambulatory Visit: Payer: 59 | Admitting: Gastroenterology

## 2022-06-24 ENCOUNTER — Telehealth: Payer: Self-pay | Admitting: *Deleted

## 2022-06-24 NOTE — Telephone Encounter (Signed)
CALLED PATIENT TO REMIND OF HDR TX. FOR 06-27-22 @ 10 AM, LVM FOR A RETURN CALL

## 2022-06-25 NOTE — Progress Notes (Signed)
  Radiation Oncology         [phone]) [phone] ________________________________  Name: Joan Graves MRN: [id number]  Date: 06/27/2022  DOB: Apr 24, 1970  CC: [facility]  Clide Cliff, MD  HDR BRACHYTHERAPY NOTE  DIAGNOSIS: The encounter diagnosis was Endometrial cancer [facility]).   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 She will be treated with a 2.5 cm diameter segmented cylinder. This conforms to her anatomy without undue discomfort.  Vaginal brachytherapy procedure node: The patient was brought to the HDR suite. Identity was confirmed. All relevant records and images related to the planned course of therapy were reviewed. The patient freely provided informed written consent to proceed with treatment after reviewing the details related to the planned course of therapy. The consent form was witnessed and verified by the simulation staff. Then, the patient was set-up in a stable reproducible supine position for radiation therapy. Pelvic exam revealed the vaginal cuff to be intact . The patient's custom vaginal cylinder was placed in the proximal vagina. This was affixed to the CT/MR stabilization plate to prevent slippage. Patient tolerated the placement well.  Verification simulation note:  A fiducial marker was placed within the vaginal cylinder. An AP and lateral film was then obtained through the pelvis area. This documented accurate position of the vaginal cylinder for treatment.  HDR BRACHYTHERAPY TREATMENT  The remote afterloading device was affixed to the vaginal cylinder by catheter. Patient then proceeded to undergo her third high-dose-rate treatment directed at the proximal vagina. The patient was prescribed a dose of 6.0 gray to be delivered to the mucosal surface. Treatment length  was 3.0 cm. Patient was treated with 1 channel using 7 dwell positions. Treatment time was 2.5 seconds. Iridium 192 was the high-dose-rate source for treatment. The patient tolerated the treatment well. After completion of her therapy, a radiation survey was performed documenting return of the iridium source into the GammaMed safe.   PLAN: The patient will return next week for her fourth high-dose-rate treatment.  ________________________________  -----------------------------------  Billie Lade, PhD, MD  This document serves as a record of services personally performed by Antony Blackbird, MD. It was created on his behalf by Neena Rhymes, a trained medical scribe. The creation of this record is based on the scribe's personal observations and the provider's statements to them. This document has been checked and approved by the attending provider.'

## 2022-06-26 ENCOUNTER — Encounter: Payer: 59 | Admitting: Genetic Counselor

## 2022-06-27 ENCOUNTER — Other Ambulatory Visit: Payer: Self-pay

## 2022-06-27 ENCOUNTER — Ambulatory Visit
Admission: RE | Admit: 2022-06-27 | Discharge: 2022-06-27 | Disposition: A | Discharge status: Home / Self Care | Payer: 59 | Source: Ambulatory Visit | Attending: Radiation Oncology | Admitting: Radiation Oncology

## 2022-06-27 DIAGNOSIS — C541 Malignant neoplasm of endometrium: Secondary | ICD-10-CM

## 2022-06-27 LAB — RAD ONC ARIA SESSION SUMMARY
Course Elapsed Days: 14
Plan Fractions Treated to Date: 3
Plan Prescribed Dose Per Fraction: 6 Gy
Plan Total Fractions Prescribed: 5
Plan Total Prescribed Dose: 30 Gy
Reference Point Dosage Given to Date: 18 Gy
Reference Point Session Dosage Given: 6 Gy
Session Number: 3

## 2022-06-28 ENCOUNTER — Ambulatory Visit
Admission: RE | Admit: 2022-06-28 | Discharge: 2022-06-28 | Disposition: A | Discharge status: Home / Self Care | Payer: 59 | Source: Ambulatory Visit | Attending: Obstetrics and Gynecology | Admitting: Obstetrics and Gynecology

## 2022-06-28 DIAGNOSIS — Z1231 Encounter for screening mammogram for malignant neoplasm of breast: Secondary | ICD-10-CM

## 2022-07-01 ENCOUNTER — Telehealth: Payer: Self-pay | Admitting: *Deleted

## 2022-07-01 NOTE — Telephone Encounter (Signed)
CALLED PATIENT TO REMIND OF HDR TX. FOR 07-04-22 @ 10 AM, LVM FOR A RETURN CALL

## 2022-07-03 NOTE — Progress Notes (Signed)
  Radiation Oncology         [phone]) [phone] ________________________________  Name: Joan Graves MRN: [id number]  Date: 07/04/2022  DOB: Nov 20, 1970  CC: [facility]  Clide Cliff, MD  HDR BRACHYTHERAPY NOTE  DIAGNOSIS: The encounter diagnosis was Endometrial cancer [facility]).   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 She will be treated with a 2.5 cm diameter segmented cylinder. This conforms to her anatomy without undue discomfort.  Vaginal brachytherapy procedure node: The patient was brought to the HDR suite. Identity was confirmed. All relevant records and images related to the planned course of therapy were reviewed. The patient freely provided informed written consent to proceed with treatment after reviewing the details related to the planned course of therapy. The consent form was witnessed and verified by the simulation staff. Then, the patient was set-up in a stable reproducible supine position for radiation therapy. Pelvic exam revealed the vaginal cuff to be intact . The patient's custom vaginal cylinder was placed in the proximal vagina. This was affixed to the CT/MR stabilization plate to prevent slippage. Patient tolerated the placement well.  Verification simulation note:  A fiducial marker was placed within the vaginal cylinder. An AP and lateral film was then obtained through the pelvis area. This documented accurate position of the vaginal cylinder for treatment.  HDR BRACHYTHERAPY TREATMENT  The remote afterloading device was affixed to the vaginal cylinder by catheter. Patient then proceeded to undergo her fourth high-dose-rate treatment directed at the proximal vagina. The patient was prescribed a dose of 6.0 gray to be delivered to the mucosal surface. Treatment length  was 3.0 cm. Patient was treated with 1 channel using 7 dwell positions. Treatment time was 175.8 seconds. Iridium 192 was the high-dose-rate source for treatment. The patient tolerated the treatment well. After completion of her therapy, a radiation survey was performed documenting return of the iridium source into the GammaMed safe.   PLAN: The patient will return next week for her fifth high-dose-rate treatment.  ________________________________  -----------------------------------  Billie Lade, PhD, MD  This document serves as a record of services personally performed by Antony Blackbird, MD. It was created on his behalf by Neena Rhymes, a trained medical scribe. The creation of this record is based on the scribe's personal observations and the provider's statements to them. This document has been checked and approved by the attending provider.'

## 2022-07-04 ENCOUNTER — Ambulatory Visit
Admission: RE | Admit: 2022-07-04 | Discharge: 2022-07-04 | Disposition: A | Discharge status: Home / Self Care | Payer: 59 | Source: Ambulatory Visit | Attending: Radiation Oncology | Admitting: Radiation Oncology

## 2022-07-04 ENCOUNTER — Other Ambulatory Visit: Payer: Self-pay

## 2022-07-04 ENCOUNTER — Encounter: Payer: 59 | Admitting: Genetic Counselor

## 2022-07-04 DIAGNOSIS — C541 Malignant neoplasm of endometrium: Secondary | ICD-10-CM | POA: Insufficient documentation

## 2022-07-04 LAB — RAD ONC ARIA SESSION SUMMARY
Course Elapsed Days: 21
Plan Fractions Treated to Date: 4
Plan Prescribed Dose Per Fraction: 6 Gy
Plan Total Fractions Prescribed: 5
Plan Total Prescribed Dose: 30 Gy
Reference Point Dosage Given to Date: 24 Gy
Reference Point Session Dosage Given: 6 Gy
Session Number: 4

## 2022-07-05 ENCOUNTER — Encounter: Payer: 59 | Admitting: Genetic Counselor

## 2022-07-06 ENCOUNTER — Encounter: Payer: 59 | Admitting: Genetic Counselor

## 2022-07-06 ENCOUNTER — Other Ambulatory Visit: Payer: 59

## 2022-07-08 ENCOUNTER — Telehealth: Payer: Self-pay | Admitting: *Deleted

## 2022-07-08 NOTE — Progress Notes (Signed)
  Radiation Oncology         [phone] ________________________________  Name: Joan Graves MRN: [id number]  Date: 07/11/2022  DOB: 25-Sep-1970  CC: [facility]  Clide Cliff, MD  HDR BRACHYTHERAPY NOTE  DIAGNOSIS: The encounter diagnosis was Endometrial cancer [facility]).   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 She will be treated with a 2.5 cm diameter segmented cylinder. This conforms to her anatomy without undue discomfort.  Vaginal brachytherapy procedure node: The patient was brought to the HDR suite. Identity was confirmed. All relevant records and images related to the planned course of therapy were reviewed. The patient freely provided informed written consent to proceed with treatment after reviewing the details related to the planned course of therapy. The consent form was witnessed and verified by the simulation staff. Then, the patient was set-up in a stable reproducible supine position for radiation therapy. Pelvic exam revealed the vaginal cuff to be intact . The patient's custom vaginal cylinder was placed in the proximal vagina. This was affixed to the CT/MR stabilization plate to prevent slippage. Patient tolerated the placement well.  Verification simulation note:  A fiducial marker was placed within the vaginal cylinder. An AP film was then obtained through the pelvis area. This documented accurate position of the vaginal cylinder for treatment.  HDR BRACHYTHERAPY TREATMENT  The remote afterloading device was affixed to the vaginal cylinder by catheter. Patient then proceeded to undergo her fifth high-dose-rate treatment directed at the proximal vagina. The patient was prescribed a dose of 6.0 gray to be delivered to the mucosal surface. Treatment length was 3.0 cm.  Patient was treated with 1 channel using 7 dwell positions. Treatment time was 187.7 seconds. Iridium 192 was the high-dose-rate source for treatment. The patient tolerated the treatment well. After completion of her therapy, a radiation survey was performed documenting return of the iridium source into the GammaMed safe.   PLAN: The patient has completed her fifth and final high-dose-rate treatment. She tolerated treatment well overall without any significant side effects . She will return for routine follow-up in one month.  ________________________________  -----------------------------------  Billie Lade, PhD, MD  This document serves as a record of services personally performed by Antony Blackbird, MD. It was created on his behalf by Neena Rhymes, a trained medical scribe. The creation of this record is based on the scribe's personal observations and the provider's statements to them. This document has been checked and approved by the attending provider.'

## 2022-07-08 NOTE — Telephone Encounter (Signed)
CALLED PATIENT TO REMIND OF HDR TX,. FOR 07-11-22 @ 10 AM, LVM FOR A RETURN CALL

## 2022-07-11 ENCOUNTER — Other Ambulatory Visit: Payer: Self-pay

## 2022-07-11 ENCOUNTER — Ambulatory Visit: Payer: 59 | Admitting: Urology

## 2022-07-11 ENCOUNTER — Ambulatory Visit
Admission: RE | Admit: 2022-07-11 | Discharge: 2022-07-11 | Disposition: A | Discharge status: Home / Self Care | Payer: 59 | Source: Ambulatory Visit | Attending: Radiation Oncology | Admitting: Radiation Oncology

## 2022-07-11 DIAGNOSIS — C541 Malignant neoplasm of endometrium: Secondary | ICD-10-CM

## 2022-07-11 LAB — RAD ONC ARIA SESSION SUMMARY
Course Elapsed Days: 28
Plan Fractions Treated to Date: 5
Plan Prescribed Dose Per Fraction: 6 Gy
Plan Total Fractions Prescribed: 5
Plan Total Prescribed Dose: 30 Gy
Reference Point Dosage Given to Date: 30 Gy
Reference Point Session Dosage Given: 6 Gy
Session Number: 5

## 2022-07-15 NOTE — Radiation Completion Notes (Signed)
Patient Name: Joan Graves, Joan Graves MRN: [id number] Date of Birth: 11-18-1970 Referring Physician: Clide Cliff, M.D. Date of Service: 2022-07-15 Radiation Oncologist: Arnette Schaumann, M.D. [facility]                             RADIATION ONCOLOGY END OF TREATMENT NOTE     Diagnosis: C54.1 Malignant neoplasm of endometrium Staging on 2022-04-12: Endometrial cancer (HCC) T=pT1a, N=pNX, M=cM0 Intent: Curative     ==========DELIVERED PLANS==========  First Treatment Date: 2022-06-13 - Last Treatment Date: 2022-07-11   Plan Name: Vagina_HDR Site: Vagina Technique: HDR Ir-192 Mode: Brachytherapy Dose Per Fraction: 6 Gy Prescribed Dose (Delivered / Prescribed): 30 Gy / 30 Gy Prescribed Fxs (Delivered / Prescribed): 5 / 5     ==========ON TREATMENT VISIT DATES========== 2022-06-13, 2022-06-20, 2022-06-27, 2022-07-04, 2022-07-11     ==========UPCOMING VISITS========== 2024-06-20T11:30:00Z [facility] ENDOSCOPY Ambulatory Surgery Jaynie Collins, DO; Jaynie Collins, DO        ==========APPENDIX - ON TREATMENT VISIT NOTES==========   See weekly On Treatment Notes is Epic for details.

## 2022-07-18 ENCOUNTER — Ambulatory Visit (INDEPENDENT_AMBULATORY_CARE_PROVIDER_SITE_OTHER): Payer: 59 | Admitting: Urology

## 2022-07-18 VITALS — BP 112/82 | HR 75 | Ht 64.0 in | Wt 322.0 lb

## 2022-07-18 DIAGNOSIS — N3946 Mixed incontinence: Secondary | ICD-10-CM | POA: Diagnosis not present

## 2022-07-18 LAB — MICROSCOPIC EXAMINATION

## 2022-07-18 LAB — URINALYSIS, COMPLETE
Bilirubin, UA: NEGATIVE
Ketones, UA: NEGATIVE
Nitrite, UA: NEGATIVE
Protein,UA: NEGATIVE
Specific Gravity, UA: 1.015 (ref 1.005–1.030)
Urobilinogen, Ur: 0.2 mg/dL (ref 0.2–1.0)
pH, UA: 5.5 (ref 5.0–7.5)

## 2022-07-18 NOTE — Progress Notes (Signed)
07/18/2022 9:07 AM   Joan Graves 03-25-1970 [id number]  Referring provider: [facility] [address] [place],  [place] [address]  Chief Complaint  Patient presents with   Follow-up   Urinary Incontinence    HPI: T: Leaks without awareness.  Has had urodynamic and failed Myrbetriq.  Had a residual of 196 mL after the study but a normal residual prior.  Was given Flomax.  Failed oxybutynin  Today Patient has small-volume urge incontinence.  She says she leaks when she is in a chair.  I could not tell if it was leakage well awareness in a chair or it occurs when she sits.  History was challenging.  No leaking with coughing sneezing.  Has moderately severe bedwetting.  Wears more than 5 pads a day moderately wet  She voids every 60 to 90 minutes.  She gets up 5 times a night.  She has ankle edema.  Flow is reasonable  She is an insulin-dependent diabetic.  No hysterectomy   On urodynamics she did not void and was catheterized for a few milliliters.  Maximum bladder capacity was 440 mL.  Bladder was stable.  She had no stress incontinence generating a Valsalva pressure 103 cm water.  During voluntary voiding she voided 244 mL with maximal flow 12 mils per second.  Max voiding pressure 37 cm of water.  Residual was 196 mL.  Bladder neck descended less than 1 cm.  She did a lot of straining to void and it was prolonged interrupted pattern.   Based upon the presentation appears that 90% of the patient's problem or greater is refractory overactive bladder.  If she does have stress incontinence it would be mild and does not explain leaking when she sits in a chair.  Treatments for stress incontinence would be challenging with body habitus.  Call if culture positive.  Reassess for pelvic examination cystoscopy on Gemtesa in 5 to 6 weeks.  Bedwetting due to overactive bladder.  No recent urine culture.  Body habitus may also make some of the  refractory OAB therapies challenging    Last urine was positive for low count of Streptococcus And positive today for blood and bacteria. Patient failed Gemtesa Patient is on Eliquis and used vape in the past but no aspirin   Cystoscopy: Patient underwent flexible cystoscopy.  Urethra was very tender but overall she did very well tolerating the procedure.  Bladder mucosa and trigone were normal.  No carcinoma.  No cystitis.    Based on body habitus refractory treatments are to be difficult.  She would not be able to catheterize and certainly we would not be able to do Botox in the office due to tenderness.  She would also need to stop her blood thinners.  Recognizing limitations I gave her Vesicare 5 mg 3x11 and reassess in 6 weeks.  I will have her stand and assess for possible InterStim.  Body habitus may be an issue.  I will order CT scan because of microscopic hematuria and call if abnormal.   Upon further discussion I had the patient's stand and she has a lot of distance in her lower back.  I went over percutaneous tibial nerve stimulation in detail and she would not have to stop her blood thinner.  She want to hear about InterStim and I went through it in detail with full template and she would have to be off the blood thinner for the test done in [place] and surgery  done here in [place].  We would need medical clearance for both from her doctor.  She is on Eliquis for atrial fibrillation diagnosed last year.  She understands that I would not be doing this with a bridge.  I urged her to try the ankle treatments and Vesicare but she did not want to try another medication and certainly the chances of it working is much lower   I do not think she should have Botox based on body habitus and Eliquis and atrial fibrillation.  We spent a long time discussing the options.  Patient shows percutaneous tibial nerve stimulation.  She should be encouraged to try the 12 weeks since again limitations are  limited and more challenging   Today Patient recently had a hysterectomy for endometrial cancer.  The respond to percutaneous tibial nerve stimulation.  She reported having cervical cancer.  She did well.  She stopped her Eliquis for 2 days prior and started it 1 day after surgery.  She still leaks without awareness with urge incontinence and frequency and would really like to have the sacral neuromodulation.  She has not done percutaneous tibial nerve stimulation for about 2 months while she was healing.  I examined her today especially her lower back and she understands that it could be challenging to place the leads and or InterStim device but I think it is possible with a longer 5 inch needles.  We talked about stopping and starting blood thinners before and after each procedure both the test stimulation implant eating medical clearance   PMH: Past Medical History:  Diagnosis Date   Anemia    Anxiety    Aortic valve stenosis    Asthma    Cancer (HCC)    endometrial cancer   Chronic pulmonary hypertension (HCC)    Depression    Diabetes mellitus without complication (HCC)    GERD (gastroesophageal reflux disease)    Hypercholesteremia    Hypertension    Paroxysmal atrial fibrillation (HCC)    Sleep apnea     Surgical History: Past Surgical History:  Procedure Laterality Date   CARDIOVERSION N/A 12/30/2020   Procedure: CARDIOVERSION;  Surgeon: Lamar Blinks, MD;  Location: [facility] ORS;  Service: Cardiovascular;  Laterality: N/A;   CATARACT EXTRACTION Right 11/2021   CATARACT EXTRACTION Left 12/2021   COLONOSCOPY     ESOPHAGOGASTRODUODENOSCOPY (EGD) WITH PROPOFOL N/A 06/29/2021   Procedure: ESOPHAGOGASTRODUODENOSCOPY (EGD) WITH PROPOFOL;  Surgeon: Midge Minium, MD;  Location: [facility] ENDOSCOPY;  Service: Endoscopy;  Laterality: N/A;   ROBOTIC ASSISTED TOTAL HYSTERECTOMY WITH BILATERAL SALPINGO OOPHERECTOMY Bilateral 04/12/2022   Procedure: XI ROBOTIC ASSISTED TOTAL HYSTERECTOMY  WITH BILATERAL SALPINGO OOPHORECTOMY;  Surgeon: Clide Cliff, MD;  Location: [facility] ORS;  Service: Gynecology;  Laterality: Bilateral;   SENTINEL NODE BIOPSY N/A 04/12/2022   Procedure: SENTINEL NODE INJECTION;  Surgeon: Clide Cliff, MD;  Location: [facility] ORS;  Service: Gynecology;  Laterality: N/A;   TUBAL LIGATION  2000    Home Medications:  Allergies as of 07/18/2022       Reactions   Aspirin Diarrhea   "high doses" gives her diarrhea        Medication List        Accurate as of Jul 18, 2022  9:07 AM. If you have any questions, ask your nurse or doctor.          Accu-Chek Softclix Lancets lancets CHECK GLUCOSE TWICE DIALY FOR GLUCOSE MONITORING E11.9   Advair Diskus 500-50 MCG/ACT Aepb Generic drug: fluticasone-salmeterol Inhale 1  puff into the lungs in the morning and at bedtime.   amLODipine 10 MG tablet Commonly known as: NORVASC Take 1 tablet by mouth daily.   atorvastatin 20 MG tablet Commonly known as: LIPITOR Take 20 mg by mouth daily.   Basaglar KwikPen 100 UNIT/ML Inject 20 Units into the skin at bedtime as needed (if CBG over 200).   buPROPion ER 100 MG 12 hr tablet Commonly known as: WELLBUTRIN SR Take 100 mg by mouth 2 (two) times daily.   cefdinir 300 MG capsule Commonly known as: OMNICEF Take 1 capsule (300 mg total) by mouth 2 (two) times daily.   dicyclomine 20 MG tablet Commonly known as: BENTYL Take 20 mg by mouth 2 (two) times daily.   Eliquis 5 MG Tabs tablet Generic drug: apixaban Take 5 mg by mouth 2 (two) times daily.   enalapril 20 MG tablet Commonly known as: VASOTEC Take 20 mg by mouth daily.   ferrous sulfate 325 (65 FE) MG EC tablet Take 650 mg by mouth daily with breakfast.   fluconazole 150 MG tablet Commonly known as: Diflucan Take 1 tablet (150 mg total) by mouth daily. Take second tablet 72 hours later   glipiZIDE 10 MG 24 hr tablet Commonly known as: GLUCOTROL XL Take 10 mg by mouth daily.   glucose blood  test strip USE AS DIRECTED TWICE A DAY   Jardiance 25 MG Tabs tablet Generic drug: empagliflozin Take 25 mg by mouth daily.   metFORMIN 500 MG 24 hr tablet Commonly known as: GLUCOPHAGE-XR Take 1,000 mg by mouth 2 (two) times daily with a meal.   metoprolol tartrate 25 MG tablet Commonly known as: LOPRESSOR Take 12.5 mg by mouth 3 (three) times daily.   omeprazole 40 MG capsule Commonly known as: PRILOSEC Take 40 mg by mouth daily.        Allergies:  Allergies  Allergen Reactions   Aspirin Diarrhea    "high doses" gives her diarrhea    Family History: Family History  Problem Relation Age of Onset   Ovarian cancer Mother        mid 74s   Lung cancer Mother 59   Throat cancer Maternal Grandmother    Colon cancer Neg Hx    Breast cancer Neg Hx    Endometrial cancer Neg Hx    Pancreatic cancer Neg Hx    Prostate cancer Neg Hx     Social History:  reports that she has quit smoking. Her smoking use included cigarettes. She has been exposed to tobacco smoke. She has never used smokeless tobacco. She reports that she does not currently use alcohol. She reports that she does not use drugs.  ROS:                                        Physical Exam: BP 112/82   Pulse 75   Ht 5\' 4"  (1.626 m)   Wt (!) 146.1 kg   LMP 10/06/2017 (Approximate) Comment: Pt did not have period for approx 4 years then started periods again this spring, irregular. neg preg test  BMI 55.27 kg/m   Constitutional:  Alert and oriented, No acute distress. HEENT: [place] AT, moist mucus membranes.  Trachea midline, no masses.   Laboratory Data: Lab Results  Component Value Date   WBC 10.5 05/30/2022   HGB 14.8 05/30/2022   HCT 48.0 (H) 05/30/2022   MCV  87.6 05/30/2022   PLT 277 05/30/2022    Lab Results  Component Value Date   CREATININE 0.74 05/30/2022    No results found for: "PSA"  No results found for: "TESTOSTERONE"  Lab Results  Component Value Date    HGBA1C 6.7 (H) 03/02/2022    Urinalysis    Component Value Date/Time   COLORURINE RED (A) 05/30/2022 2125   APPEARANCEUR TURBID (A) 05/30/2022 2125   APPEARANCEUR Clear 11/15/2021 1450   LABSPEC 1.039 (H) 05/30/2022 2125   PHURINE  05/30/2022 2125    TEST NOT REPORTED DUE TO COLOR INTERFERENCE OF URINE PIGMENT   GLUCOSEU (A) 05/30/2022 2125    TEST NOT REPORTED DUE TO COLOR INTERFERENCE OF URINE PIGMENT   HGBUR (A) 05/30/2022 2125    TEST NOT REPORTED DUE TO COLOR INTERFERENCE OF URINE PIGMENT   BILIRUBINUR (A) 05/30/2022 2125    TEST NOT REPORTED DUE TO COLOR INTERFERENCE OF URINE PIGMENT   BILIRUBINUR Negative 11/15/2021 1450   KETONESUR (A) 05/30/2022 2125    TEST NOT REPORTED DUE TO COLOR INTERFERENCE OF URINE PIGMENT   PROTEINUR (A) 05/30/2022 2125    TEST NOT REPORTED DUE TO COLOR INTERFERENCE OF URINE PIGMENT   NITRITE (A) 05/30/2022 2125    TEST NOT REPORTED DUE TO COLOR INTERFERENCE OF URINE PIGMENT   LEUKOCYTESUR (A) 05/30/2022 2125    TEST NOT REPORTED DUE TO COLOR INTERFERENCE OF URINE PIGMENT    Pertinent Imaging:   Assessment & Plan: Send urine for culture.  Schedule peripheral nerve evaluation.  InterStim could then be done here locally  1. Mixed incontinence  - Urinalysis, Complete   No follow-ups on file.  Martina Sinner, MD  [facility] [address] [place], [place] [address] [phone]

## 2022-07-23 LAB — CULTURE, URINE COMPREHENSIVE

## 2022-07-27 ENCOUNTER — Telehealth: Payer: Self-pay | Admitting: *Deleted

## 2022-07-27 MED ORDER — NITROFURANTOIN MONOHYD MACRO 100 MG PO CAPS: 100.0000 mg | ORAL_CAPSULE | Freq: Two times a day (BID) | ORAL | 0 refills | Status: AC

## 2022-07-27 NOTE — Telephone Encounter (Signed)
Spoke with patient and advised results rx sent to pharmacy by e-script  

## 2022-07-27 NOTE — Telephone Encounter (Signed)
-----   Message from Alfredo Martinez, MD sent at 07/26/2022 11:09 AM EDT ----- Please make sure I have not already treated this pos c/s, if not please send  Macrodantin 100 mg twice a day for 7 days ----- Message ----- From: Sueanne Margarita, CMA Sent: 07/21/2022   7:56 AM EDT To: Alfredo Martinez, MD   ----- Message ----- From: Interface, [facility] Lab Results In Sent: 07/18/2022  11:36 AM EDT To: Jennette Kettle Clinical

## 2022-07-27 NOTE — Telephone Encounter (Signed)
.  left message to have patient return my call.  Need to know which pharmacy to send rx to

## 2022-08-08 ENCOUNTER — Encounter: Payer: Self-pay | Admitting: Radiation Oncology

## 2022-08-08 ENCOUNTER — Telehealth: Payer: Self-pay

## 2022-08-08 NOTE — Telephone Encounter (Signed)
Call left on triage line  Pt states she would like another referral to [facility].   Pt has contacted [facility] for an peripheral nerve evaluation. [facility] has contacted her and informed her that due to her BMI she will need to be seen at [facility].   I advised pt that we do not do Peripheral nerve evaluation here at [facility].   She will need to contact [facility]. We have no control over this procedure or hospital.   Pt voiced understanding.

## 2022-08-10 NOTE — Progress Notes (Signed)
Joan Graves is here today for follow up post radiation to the pelvis.  They completed their radiation on:  07/11/22  Does the patient complain of any of the following:  Pain: Reports lower back pain that radiates up to her shoulder blades (states it really flares when she's changing position; reports she has tried NSAIDs and heat and neither provide relief) Abdominal bloating: Denies Diarrhea/Constipation: Denies--reports regular bowel movements without difficulty Nausea/Vomiting: Denies Wt Readings from Last 3 Encounters:  08/15/22 (!) 333 lb 6.4 oz (151.2 kg)  07/18/22 (!) 322 lb (146.1 kg)  06/13/22 (!) 322 lb 4 oz (146.2 kg)   Vaginal Discharge: Denies, but does report intermittent vaginal itching  Blood in Urine or Stool: Denies Urinary Issues (dysuria/incomplete emptying/ incontinence/ increased frequency/urgency): Denies dysuria, urinary incontinence, or urinary urgency/frequency  Does patient report using vaginal dilator 2-3 times a week and/or sexually active 2-3 weeks: Resumed having sex with her husband, also provided dilator education today Post radiation skin changes: Denies   Additional comments if applicable:  Home Care Instructions for the Insertion and Care of Your Vaginal Dilator  Why Do I Need a Vaginal Dilator?  Internal radiation therapy may cause scar tissue to form at the top of your vagina (vaginal cuff).  This may make vaginal examinations difficult in the future. You can prevent scar tissue from forming by using a vaginal dilator (a smooth plastic rod), and/or by having regular sexual intercourse.  If not using the dilator you should be having intercourse two or three times a week.  If you are unable to have intercourse, you should use your vaginal dilator.  You may have some spotting or bleeding from your dilator or intercourse the first few times. You may also have some discomfort. If discomfort occurs with intercourse, you and your partner may  need to stop for a while and try again later.  How to Use Your Vaginal Dilator  Wash the dilator with soap and water before and after each use. Check the dilator to be sure it is smooth. Do not use the dilator if you find any roughspots. Coat the dilator with K-Y Jelly, Astroglide, or Replens. Do not use Vaseline, baby oil, or other oil based lubricants. They are not water-soluble and can be irritating to the tissues in the vagina. Lie on your back with your knees bent and legs apart. Insert the rounded end of the dilator into your vagina as far as it will go without causing pain or discomfort. Close your knees and slowly straighten your legs. Keep the dilator in your vagina for about 10 to 15 minutes.  Please use 3 times a week, for example: Monday, Wednesday and Friday evenings. Bend your knees, open your legs, and gently remove the dilator. Gently cleanse the skin around the vaginal opening. Wash the dilator after each use.  It is important that you use the dilator routinely until instructed otherwise by your doctor.

## 2022-08-11 ENCOUNTER — Encounter: Payer: Self-pay | Admitting: Gastroenterology

## 2022-08-11 NOTE — Progress Notes (Signed)
  Radiation Oncology         [phone]) [phone] ________________________________  Name: Joan Graves MRN: [id number]  Date: 08/15/2022  DOB: 1970/04/28  End of Treatment Note  Diagnosis: The encounter diagnosis was Endometrial cancer (HCC).   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  Plan: The patient has completed radiation treatment. The patient will return to radiation oncology clinic for routine followup in one month. I advised them to call or return sooner if they have any questions or concerns related to their recovery or treatment.  -----------------------------------  Billie Lade, PhD, MD  This document serves as a record of services personally performed by Antony Blackbird, MD. It was created on his behalf by Neena Rhymes, a trained medical scribe. The creation of this record is based on the scribe's personal observations and the provider's statements to them. This document has been checked and approved by the attending provider.

## 2022-08-14 NOTE — Progress Notes (Signed)
Radiation Oncology         [phone] ________________________________  Name: Joan Graves MRN: [id number]  Date: 08/15/2022  DOB: 1970-10-03  Follow-Up Visit Note  CC: [facility]  Clide Cliff, MD  No diagnosis found.  Diagnosis: The encounter diagnosis was Endometrial cancer (HCC).   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  She tolerated vaginal brachytherapy well overall without any significant side effects.   No significant oncologic interval history overall since the patient completed radiation therapy or since starting therapy. She continues to meet with urology to address her issues with urinary incontinence.    Pertinent imaging performed in the interval includes a bilateral screening mammogram on 06/28/22 which showed no evidence of malignancy in either breast.   ***                             Allergies:  is allergic to aspirin.  Meds: Current Outpatient Medications  Medication Sig Dispense Refill   ACCU-CHEK SOFTCLIX LANCETS lancets CHECK GLUCOSE TWICE DIALY FOR GLUCOSE MONITORING E11.9  5   ADVAIR DISKUS 500-50 MCG/DOSE AEPB Inhale 1 puff into the lungs in the morning and at bedtime.  5   amiodarone (PACERONE) 200 MG tablet Take 200 mg by mouth daily.     amLODipine (NORVASC) 10 MG tablet Take 1 tablet by mouth daily.     apixaban (ELIQUIS) 5 MG TABS tablet Take 5 mg by mouth 2 (two) times daily.     atorvastatin (LIPITOR) 20 MG tablet Take 20 mg by mouth daily.     buPROPion (WELLBUTRIN SR) 100  MG 12 hr tablet Take 100 mg by mouth 2 (two) times daily.     dicyclomine (BENTYL) 20 MG tablet Take 20 mg by mouth 2 (two) times daily.     enalapril (VASOTEC) 20 MG tablet Take 20 mg by mouth daily.     ferrous sulfate 325 (65 FE) MG EC tablet Take 650 mg by mouth daily with breakfast.     fluconazole (DIFLUCAN) 150 MG tablet Take 1 tablet (150 mg total) by mouth daily. Take second tablet 72 hours later 2 tablet 0   glipiZIDE (GLUCOTROL XL) 10 MG 24 hr tablet Take 10 mg by mouth daily.     glucose blood test strip USE AS DIRECTED TWICE A DAY     Insulin Glargine (BASAGLAR KWIKPEN) 100 UNIT/ML Inject 20 Units into the skin at bedtime as needed (if CBG over 200).     JARDIANCE 25 MG TABS tablet Take 25 mg by mouth daily.     metFORMIN (GLUCOPHAGE-XR) 500 MG 24 hr tablet Take 1,000 mg by mouth 2 (two) times daily with a meal.  1   metoprolol tartrate (LOPRESSOR) 25 MG tablet Take 12.5 mg by mouth 3 (three) times daily.     omeprazole (PRILOSEC) 40 MG capsule Take 40 mg by mouth daily.     No current facility-administered medications for this encounter.    Physical Findings: The patient  is in no acute distress. Patient is alert and oriented.  vitals were not taken for this visit. .  No significant changes. Lungs are clear to auscultation bilaterally. Heart has regular rate and rhythm. No palpable cervical, supraclavicular, or axillary adenopathy. Abdomen soft, non-tender, normal bowel sounds.   Lab Findings: Lab Results  Component Value Date   WBC 10.5 05/30/2022   HGB 14.8 05/30/2022   HCT 48.0 (H) 05/30/2022   MCV 87.6 05/30/2022   PLT 277 05/30/2022    Radiographic Findings: No results found.  Impression:  The encounter diagnosis was Endometrial cancer (HCC).   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  ***  Plan:  ***   *** minutes  of total time was spent for this patient encounter, including preparation, face-to-face counseling with the patient and coordination of care, physical exam, and documentation of the encounter. ____________________________________  Billie Lade, PhD, MD  This document serves as a record of services personally performed by Antony Blackbird, MD. It was created on his behalf by Neena Rhymes, a trained medical scribe. The creation of this record is based on the scribe's personal observations and the provider's statements to them. This document has been checked and approved by the attending provider.

## 2022-08-15 ENCOUNTER — Other Ambulatory Visit: Payer: Self-pay

## 2022-08-15 ENCOUNTER — Ambulatory Visit
Admission: RE | Admit: 2022-08-15 | Discharge: 2022-08-15 | Disposition: A | Discharge status: Home / Self Care | Payer: 59 | Source: Ambulatory Visit | Attending: Radiation Oncology | Admitting: Radiation Oncology

## 2022-08-15 VITALS — BP 119/69 | HR 79 | Temp 97.5°F | Resp 20 | Ht 63.0 in | Wt 333.4 lb

## 2022-08-15 DIAGNOSIS — Z90710 Acquired absence of both cervix and uterus: Secondary | ICD-10-CM | POA: Insufficient documentation

## 2022-08-15 DIAGNOSIS — C541 Malignant neoplasm of endometrium: Secondary | ICD-10-CM

## 2022-08-15 DIAGNOSIS — Z923 Personal history of irradiation: Secondary | ICD-10-CM | POA: Insufficient documentation

## 2022-08-15 DIAGNOSIS — Z90722 Acquired absence of ovaries, bilateral: Secondary | ICD-10-CM | POA: Insufficient documentation

## 2022-08-15 HISTORY — DX: Personal history of irradiation: Z92.3

## 2022-08-17 ENCOUNTER — Encounter: Payer: Self-pay | Admitting: Gastroenterology

## 2022-08-17 NOTE — H&P (Signed)
Pre-Procedure H&P   Patient ID: Joan Graves is a 52 y.o. female.  Gastroenterology Provider: Jaynie Collins, DO  Referring Provider: Jacob Moores, PA PCP: [facility]  Date: 08/18/2022  HPI Ms. Joan Graves is a 52 y.o. female who presents today for Colonoscopy for Change in bowel habits, diarrhea .  Since November the patient has noted ongoing loose bowel movements.  She has 4-10 diarrheal bowel movements per day associated with abdominal cramping, nocturnal awakening, urgency, fecal leakage.  Bristol stool scale 6-7.  She has noted some blood with wiping.  No appetite or weight changes.  Infectious workup is negative. She notes a lot of sx have improved around March ESR 34 CRP 12.3 in January.  CT imaging was negative in January for any bowel findings.  No improvement with Lomotil  Vapes daily.  Consistently eating fast food per chart report.  No family history of colon cancer or colon polyps or IBD.  Underwent hysterectomy in February due to endometrial cancer.  She finished radiation  Last colonoscopy 15+ years ago negative per patient report  Most recent lab work creatinine 0.7 hemoglobin 14.8 MCV 87.6 platelets 277,000  Patient is on Eliquis which has been held for this procedure (last dose Monday morning)  Marked as intermediate risk for sedation and endoscopy as per her cardiology provider   Past Medical History:  Diagnosis Date   Anemia    Anxiety    Aortic valve stenosis    Asthma    Bradycardia    Cancer (HCC)    endometrial cancer   Chronic pulmonary hypertension (HCC)    Depression    Diabetes mellitus without complication (HCC)    Dysrhythmia    Endometrial cancer (HCC)    GERD (gastroesophageal reflux disease)    History of radiation therapy    Endometrial- HDR- 06/13/22-07/11/22- Dr. Antony Blackbird   Hypercholesteremia    Hypertension    Paroxysmal atrial fibrillation (HCC)    Paroxysmal atrial  fibrillation (HCC)    Sleep apnea     Past Surgical History:  Procedure Laterality Date   CARDIOVERSION N/A 12/30/2020   Procedure: CARDIOVERSION;  Surgeon: Lamar Blinks, MD;  Location: [facility] ORS;  Service: Cardiovascular;  Laterality: N/A;   CATARACT EXTRACTION Right 11/2021   CATARACT EXTRACTION Left 12/2021   COLONOSCOPY     ESOPHAGOGASTRODUODENOSCOPY (EGD) WITH PROPOFOL N/A 06/29/2021   Procedure: ESOPHAGOGASTRODUODENOSCOPY (EGD) WITH PROPOFOL;  Surgeon: Midge Minium, MD;  Location: [facility] ENDOSCOPY;  Service: Endoscopy;  Laterality: N/A;   ROBOTIC ASSISTED TOTAL HYSTERECTOMY WITH BILATERAL SALPINGO OOPHERECTOMY Bilateral 04/12/2022   Procedure: XI ROBOTIC ASSISTED TOTAL HYSTERECTOMY WITH BILATERAL SALPINGO OOPHORECTOMY;  Surgeon: Clide Cliff, MD;  Location: [facility];  Service: Gynecology;  Laterality: Bilateral;   SENTINEL NODE BIOPSY N/A 04/12/2022   Procedure: SENTINEL NODE INJECTION;  Surgeon: Clide Cliff, MD;  Location: [facility] ORS;  Service: Gynecology;  Laterality: N/A;   TUBAL LIGATION  2000    Family History No h/o GI disease or malignancy  Review of Systems  Constitutional:  Negative for activity change, appetite change, chills, diaphoresis, fatigue, fever and unexpected weight change.  HENT:  Negative for trouble swallowing and voice change.   Respiratory:  Negative for shortness of breath and wheezing.   Cardiovascular:  Negative for chest pain, palpitations and leg swelling.  Gastrointestinal:  Positive for abdominal pain and diarrhea. Negative for abdominal distention, anal bleeding, blood in stool, constipation, nausea, rectal pain and vomiting.  Musculoskeletal:  Negative  for arthralgias and myalgias.  Skin:  Negative for color change and pallor.  Neurological:  Negative for dizziness, syncope and weakness.  Psychiatric/Behavioral:  Negative for confusion.   All other systems reviewed and are negative.    Medications No current facility-administered  medications on file prior to encounter.   Current Outpatient Medications on File Prior to Encounter  Medication Sig Dispense Refill   amiodarone (PACERONE) 200 MG tablet Take 200 mg by mouth daily.     amLODipine (NORVASC) 10 MG tablet Take 1 tablet by mouth daily.     atorvastatin (LIPITOR) 20 MG tablet Take 20 mg by mouth daily.     buPROPion (WELLBUTRIN SR) 100 MG 12 hr tablet Take 100 mg by mouth 2 (two) times daily.     dicyclomine (BENTYL) 20 MG tablet Take 20 mg by mouth 2 (two) times daily.     enalapril (VASOTEC) 20 MG tablet Take 20 mg by mouth daily.     ferrous sulfate 325 (65 FE) MG EC tablet Take 650 mg by mouth daily with breakfast.     Insulin Glargine (BASAGLAR KWIKPEN) 100 UNIT/ML Inject 20 Units into the skin at bedtime as needed (if CBG over 200).     JARDIANCE 25 MG TABS tablet Take 25 mg by mouth daily.     metFORMIN (GLUCOPHAGE-XR) 500 MG 24 hr tablet Take 1,000 mg by mouth 2 (two) times daily with a meal.  1   metoprolol tartrate (LOPRESSOR) 25 MG tablet Take 12.5 mg by mouth 3 (three) times daily.     omeprazole (PRILOSEC) 40 MG capsule Take 40 mg by mouth daily.     ACCU-CHEK SOFTCLIX LANCETS lancets CHECK GLUCOSE TWICE DIALY FOR GLUCOSE MONITORING E11.9  5   ADVAIR DISKUS 500-50 MCG/DOSE AEPB Inhale 1 puff into the lungs in the morning and at bedtime.  5   apixaban (ELIQUIS) 5 MG TABS tablet Take 5 mg by mouth 2 (two) times daily.     glucose blood test strip USE AS DIRECTED TWICE A DAY      Pertinent medications related to GI and procedure were reviewed by me with the patient prior to the procedure   Current Facility-Administered Medications:    0.9 %  sodium chloride infusion, , Intravenous, Continuous, Jaynie Collins, DO  sodium chloride         Allergies  Allergen Reactions   Aspirin Diarrhea    "high doses" gives her diarrhea   Allergies were reviewed by me prior to the procedure  Objective   Body mass index is 58.99 kg/m. Vitals:    08/18/22 0703  BP: 132/81  Pulse: 65  Resp: 18  Temp: (!) 96.6 F (35.9 C)  TempSrc: Temporal  SpO2: 100%  Weight: (!) 151 kg  Height: 5\' 3"  (1.6 m)     Physical Exam Vitals and nursing note reviewed.  Constitutional:      General: She is not in acute distress.    Appearance: Normal appearance. She is obese. She is not ill-appearing, toxic-appearing or diaphoretic.  HENT:     Head: Normocephalic and atraumatic.     Nose: Nose normal.     Mouth/Throat:     Mouth: Mucous membranes are moist.     Pharynx: Oropharynx is clear.  Eyes:     General: No scleral icterus.    Extraocular Movements: Extraocular movements intact.  Cardiovascular:     Rate and Rhythm: Normal rate and regular rhythm.     Heart sounds: Normal heart  sounds. No murmur heard.    No friction rub. No gallop.  Pulmonary:     Effort: Pulmonary effort is normal. No respiratory distress.     Breath sounds: Normal breath sounds. No wheezing, rhonchi or rales.  Abdominal:     General: Bowel sounds are normal. There is no distension.     Palpations: Abdomen is soft.     Tenderness: There is no abdominal tenderness. There is no guarding or rebound.  Musculoskeletal:     Cervical back: Neck supple.     Right lower leg: No edema.     Left lower leg: No edema.  Skin:    General: Skin is warm and dry.     Coloration: Skin is not jaundiced or pale.  Neurological:     General: No focal deficit present.     Mental Status: She is alert and oriented to person, place, and time. Mental status is at baseline.  Psychiatric:        Mood and Affect: Mood normal.        Behavior: Behavior normal.        Thought Content: Thought content normal.        Judgment: Judgment normal.      Assessment:  Ms. Joan Graves is a 52 y.o. female  who presents today for Colonoscopy for Change in bowel habits, diarrhea .  Plan:  Colonoscopy with possible intervention today  Colonoscopy with possible biopsy, control of  bleeding, polypectomy, and interventions as necessary has been discussed with the patient/patient representative. Informed consent was obtained from the patient/patient representative after explaining the indication, nature, and risks of the procedure including but not limited to death, bleeding, perforation, missed neoplasm/lesions, cardiorespiratory compromise, and reaction to medications. Opportunity for questions was given and appropriate answers were provided. Patient/patient representative has verbalized understanding is amenable to undergoing the procedure.   Jaynie Collins, DO  [facility]  Portions of the record may have been created with voice recognition software. Occasional wrong-word or 'sound-a-like' substitutions may have occurred due to the inherent limitations of voice recognition software.  Read the chart carefully and recognize, using context, where substitutions may have occurred.

## 2022-08-18 ENCOUNTER — Encounter: Payer: Self-pay | Admitting: Gastroenterology

## 2022-08-18 ENCOUNTER — Other Ambulatory Visit: Payer: Self-pay

## 2022-08-18 ENCOUNTER — Ambulatory Visit: Payer: 59 | Admitting: General Practice

## 2022-08-18 ENCOUNTER — Encounter
Admission: RE | Disposition: A | Discharge status: Home / Self Care | Payer: Self-pay | Source: Home / Self Care | Attending: Gastroenterology

## 2022-08-18 ENCOUNTER — Ambulatory Visit
Admission: RE | Admit: 2022-08-18 | Discharge: 2022-08-18 | Disposition: A | Discharge status: Home / Self Care | Payer: 59 | Attending: Gastroenterology | Admitting: Gastroenterology

## 2022-08-18 DIAGNOSIS — Z923 Personal history of irradiation: Secondary | ICD-10-CM | POA: Insufficient documentation

## 2022-08-18 DIAGNOSIS — I272 Pulmonary hypertension, unspecified: Secondary | ICD-10-CM | POA: Insufficient documentation

## 2022-08-18 DIAGNOSIS — Z87891 Personal history of nicotine dependence: Secondary | ICD-10-CM | POA: Diagnosis not present

## 2022-08-18 DIAGNOSIS — Z79899 Other long term (current) drug therapy: Secondary | ICD-10-CM | POA: Insufficient documentation

## 2022-08-18 DIAGNOSIS — E7800 Pure hypercholesterolemia, unspecified: Secondary | ICD-10-CM | POA: Insufficient documentation

## 2022-08-18 DIAGNOSIS — Z8542 Personal history of malignant neoplasm of other parts of uterus: Secondary | ICD-10-CM | POA: Insufficient documentation

## 2022-08-18 DIAGNOSIS — F32A Depression, unspecified: Secondary | ICD-10-CM | POA: Insufficient documentation

## 2022-08-18 DIAGNOSIS — I4891 Unspecified atrial fibrillation: Secondary | ICD-10-CM | POA: Insufficient documentation

## 2022-08-18 DIAGNOSIS — I480 Paroxysmal atrial fibrillation: Secondary | ICD-10-CM | POA: Insufficient documentation

## 2022-08-18 DIAGNOSIS — Z7984 Long term (current) use of oral hypoglycemic drugs: Secondary | ICD-10-CM | POA: Insufficient documentation

## 2022-08-18 DIAGNOSIS — F419 Anxiety disorder, unspecified: Secondary | ICD-10-CM | POA: Insufficient documentation

## 2022-08-18 DIAGNOSIS — K529 Noninfective gastroenteritis and colitis, unspecified: Secondary | ICD-10-CM | POA: Diagnosis present

## 2022-08-18 DIAGNOSIS — I10 Essential (primary) hypertension: Secondary | ICD-10-CM | POA: Diagnosis not present

## 2022-08-18 DIAGNOSIS — E119 Type 2 diabetes mellitus without complications: Secondary | ICD-10-CM | POA: Insufficient documentation

## 2022-08-18 DIAGNOSIS — K621 Rectal polyp: Secondary | ICD-10-CM | POA: Insufficient documentation

## 2022-08-18 DIAGNOSIS — Z6841 Body Mass Index (BMI) 40.0 and over, adult: Secondary | ICD-10-CM | POA: Diagnosis not present

## 2022-08-18 DIAGNOSIS — G4730 Sleep apnea, unspecified: Secondary | ICD-10-CM | POA: Insufficient documentation

## 2022-08-18 DIAGNOSIS — K219 Gastro-esophageal reflux disease without esophagitis: Secondary | ICD-10-CM | POA: Insufficient documentation

## 2022-08-18 DIAGNOSIS — Z7901 Long term (current) use of anticoagulants: Secondary | ICD-10-CM | POA: Diagnosis not present

## 2022-08-18 DIAGNOSIS — J45909 Unspecified asthma, uncomplicated: Secondary | ICD-10-CM | POA: Diagnosis not present

## 2022-08-18 DIAGNOSIS — Z794 Long term (current) use of insulin: Secondary | ICD-10-CM | POA: Insufficient documentation

## 2022-08-18 HISTORY — DX: Malignant neoplasm of endometrium: C54.1

## 2022-08-18 HISTORY — DX: Cardiac arrhythmia, unspecified: I49.9

## 2022-08-18 HISTORY — DX: Bradycardia, unspecified: R00.1

## 2022-08-18 HISTORY — PX: POLYPECTOMY: SHX5525

## 2022-08-18 HISTORY — PX: COLONOSCOPY WITH PROPOFOL: SHX5780

## 2022-08-18 LAB — GLUCOSE, CAPILLARY: Glucose-Capillary: 155 mg/dL — ABNORMAL HIGH (ref 70–99)

## 2022-08-18 SURGERY — COLONOSCOPY WITH PROPOFOL
Anesthesia: General

## 2022-08-18 MED ORDER — PHENYLEPHRINE HCL (PRESSORS) 10 MG/ML IV SOLN
INTRAVENOUS | Status: DC | PRN
  Administered 2022-08-18: 80 ug via INTRAVENOUS
  Administered 2022-08-18: 40 ug via INTRAVENOUS

## 2022-08-18 MED ORDER — SODIUM CHLORIDE 0.9 % IV SOLN: INTRAVENOUS | Status: DC

## 2022-08-18 MED ORDER — PROPOFOL 1000 MG/100ML IV EMUL
INTRAVENOUS | Status: AC
  Filled 2022-08-18: qty 100

## 2022-08-18 MED ORDER — GLYCOPYRROLATE 0.2 MG/ML IJ SOLN
INTRAMUSCULAR | Status: DC | PRN
  Administered 2022-08-18 (×2): .1 mg via INTRAVENOUS

## 2022-08-18 MED ORDER — PROPOFOL 1000 MG/100ML IV EMUL
INTRAVENOUS | Status: AC
  Filled 2022-08-18: qty 200

## 2022-08-18 MED ORDER — PROPOFOL 500 MG/50ML IV EMUL
INTRAVENOUS | Status: DC | PRN
  Administered 2022-08-18: 150 ug/kg/min via INTRAVENOUS

## 2022-08-18 MED ORDER — PROPOFOL 10 MG/ML IV BOLUS
INTRAVENOUS | Status: DC | PRN
  Administered 2022-08-18: 80 mg via INTRAVENOUS

## 2022-08-18 MED ORDER — LIDOCAINE HCL (CARDIAC) PF 100 MG/5ML IV SOSY
PREFILLED_SYRINGE | INTRAVENOUS | Status: DC | PRN
  Administered 2022-08-18: 100 mg via INTRAVENOUS

## 2022-08-18 NOTE — Anesthesia Postprocedure Evaluation (Signed)
Anesthesia Post Note  Patient: [name]  Procedure(s) Performed: COLONOSCOPY WITH PROPOFOL  Patient location during evaluation: Endoscopy Anesthesia Type: General Level of consciousness: awake and alert Pain management: pain level controlled Vital Signs Assessment: post-procedure vital signs reviewed and stable Respiratory status: spontaneous breathing, nonlabored ventilation, respiratory function stable and patient connected to nasal cannula oxygen Cardiovascular status: blood pressure returned to baseline and stable Postop Assessment: no apparent nausea or vomiting Anesthetic complications: no  No notable events documented.   Last Vitals:  Vitals:   08/18/22 0703 08/18/22 0800  BP: 132/81 (!) 96/43  Pulse: 65   Resp: 18   Temp: (!) 35.9 C (!) 35.7 C  SpO2: 100%     Last Pain:  Vitals:   08/18/22 0800  TempSrc: Temporal  PainSc: Asleep                 Stephanie Coup

## 2022-08-18 NOTE — Op Note (Signed)
[facility] Gastroenterology Patient Name: Joan Graves Procedure Date: 08/18/2022 7:37 AM MRN: [id number] Account #: [id number] Date of Birth: January 30, 1971 Admit Type: Outpatient Age: 52 Room: [facility] ENDO ROOM 1 Gender: Female Note Status: Finalized Instrument Name: Prentice Docker [id number] Procedure:             Colonoscopy Indications:           Chronic diarrhea, Incidental change in bowel habits                         noted Providers:             Trenda Moots, DO Referring MD:          Jaynie Collins DO, DO (Referring MD), No Local                         Md, MD (Referring MD) Medicines:             Monitored Anesthesia Care Complications:         No immediate complications. Estimated blood loss:                         Minimal. Procedure:             Pre-Anesthesia Assessment:                        - Prior to the procedure, a History and Physical was                         performed, and patient medications and allergies were                         reviewed. The patient is competent. The risks and                         benefits of the procedure and the sedation options and                         risks were discussed with the patient. All questions                         were answered and informed consent was obtained.                         Patient identification and proposed procedure were                         verified by the physician, the nurse, the anesthetist                         and the technician in the endoscopy suite. Mental                         Status Examination: alert and oriented. Airway                         Examination: normal oropharyngeal airway and neck  mobility. Respiratory Examination: clear to                         auscultation. CV Examination: bradycardia noted.                         Prophylactic Antibiotics: The patient does not require                         prophylactic  antibiotics. Prior Anticoagulants: The                         patient has taken Eliquis (apixaban), last dose was 3                         days prior to procedure. ASA Grade Assessment: III - A                         patient with severe systemic disease. After reviewing                         the risks and benefits, the patient was deemed in                         satisfactory condition to undergo the procedure. The                         anesthesia plan was to use monitored anesthesia care                         (MAC). Immediately prior to administration of                         medications, the patient was re-assessed for adequacy                         to receive sedatives. The heart rate, respiratory                         rate, oxygen saturations, blood pressure, adequacy of                         pulmonary ventilation, and response to care were                         monitored throughout the procedure. The physical                         status of the patient was re-assessed after the                         procedure.                        After obtaining informed consent, the colonoscope was                         passed under direct vision. Throughout the procedure,  the patient's blood pressure, pulse, and oxygen                         saturations were monitored continuously. The                         Colonoscope was introduced through the anus and                         advanced to the the cecum, identified by appendiceal                         orifice and ileocecal valve. The colonoscopy was                         performed without difficulty. The patient tolerated                         the procedure well. The quality of the bowel                         preparation was evaluated using the BBPS [facility] Bowel                         Preparation Scale) with scores of: Right Colon = 1                         (portion of mucosa seen,  but other areas not well seen                         due to staining, residual stool and/or opaque liquid),                         Transverse Colon = 2 (minor amount of residual                         staining, small fragments of stool and/or opaque                         liquid, but mucosa seen well) and Left Colon = 1                         (portion of mucosa seen, but other areas not well seen                         due to staining, residual stool and/or opaque liquid).                         The total BBPS score equals 4. The quality of the                         bowel preparation was inadequate. The ileocecal valve,                         appendiceal orifice, and rectum were photographed. Findings:      Two sessile polyps were found in the rectum. The polyps were 1  to 2 mm       in size. These polyps were removed with a jumbo cold forceps. Resection       and retrieval were complete. Estimated blood loss was minimal.      A large amount of stool was found in the entire colon, precluding       visualization. Lavage of the area was performed using a large amount,       resulting in incomplete clearance with fair visualization. liquid and       solid food residue precluded visualization. Attempts at lavage and       suction, however, food residue frequently clogged the scope. Estimated       blood loss: none.      Normal mucosa was found in the entire colon. of what was able to be       visualized. Biopsies for histology were taken with a cold forceps from       the right colon and left colon for evaluation of microscopic colitis.       Estimated blood loss was minimal.      The exam was otherwise without abnormality on direct and retroflexion       views of what could be visualized. Impression:            - Preparation of the colon was inadequate.                        - Two 1 to 2 mm polyps in the rectum, removed with a                         jumbo cold forceps. Resected  and retrieved.                        - Stool in the entire examined colon.                        - Normal mucosa in the entire examined colon. Biopsied.                        - The examination was otherwise normal on direct and                         retroflexion views. Recommendation:        - Patient has a contact number available for                         emergencies. The signs and symptoms of potential                         delayed complications were discussed with the patient.                         Return to normal activities tomorrow. Written                         discharge instructions were provided to the patient.                        - Discharge patient to home.                        -  Resume previous diet.                        - Continue present medications.                        - Resume Eliquis (apixaban) at prior dose tomorrow.                         Refer to managing physician for further adjustment of                         therapy.                        - Await pathology results.                        - Repeat colonoscopy in 1 year because the bowel                         preparation was poor.                        - Return to GI office as previously scheduled.                        - The findings and recommendations were discussed with                         the patient. Procedure Code(s):     --- Professional ---                        [phone], Colonoscopy, flexible; with biopsy, single or                         multiple Diagnosis Code(s):     --- Professional ---                        D12.8, Benign neoplasm of rectum                        K52.9, Noninfective gastroenteritis and colitis,                         unspecified CPT copyright 2022 American Medical Association. All rights reserved. The codes documented in this report are preliminary and upon coder review may  be revised to meet current compliance requirements. Attending  Participation:      I personally performed the entire procedure. Elfredia Nevins, DO Jaynie Collins DO, DO 08/18/2022 8:02:03 AM This report has been signed electronically. Number of Addenda: 0 Note Initiated On: 08/18/2022 7:37 AM Scope Withdrawal Time: 0 hours 8 minutes 31 seconds  Total Procedure Duration: 0 hours 12 minutes 47 seconds  Estimated Blood Loss:  Estimated blood loss was minimal.      [facility]

## 2022-08-18 NOTE — Anesthesia Preprocedure Evaluation (Addendum)
Anesthesia Evaluation  Patient identified by MRN, date of birth, ID band Patient awake    Reviewed: Allergy & Precautions, NPO status , Patient's Chart, lab work & pertinent test results, reviewed documented beta blocker date and time   History of Anesthesia Complications Negative for: history of anesthetic complications  Airway Mallampati: III  TM Distance: >3 FB Neck ROM: Full    Dental no notable dental hx. (+) Chipped   Pulmonary asthma , sleep apnea and Continuous Positive Airway Pressure Ventilation , Patient abstained from smoking., former smoker   Pulmonary exam normal        Cardiovascular hypertension, Pt. on medications and Pt. on home beta blockers Normal cardiovascular exam+ dysrhythmias Atrial Fibrillation   TTE 11/2020: EF 55-60%, mild RAE/LAE, mild MR, mild AS    Neuro/Psych   Anxiety Depression    negative neurological ROS  negative psych ROS   GI/Hepatic negative GI ROS, Neg liver ROS,GERD  Medicated,,  Endo/Other  diabetes, Type 2, Oral Hypoglycemic Agents, Insulin Dependent  Morbid obesity  Renal/GU negative Renal ROS  negative genitourinary   Musculoskeletal   Abdominal   Peds  Hematology negative hematology ROS (+)   Anesthesia Other Findings Past Medical History: No date: Anemia No date: Anxiety No date: Aortic valve stenosis No date: Asthma No date: Bradycardia No date: Cancer [facility])     Comment:  endometrial cancer No date: Chronic pulmonary hypertension (HCC) No date: Depression No date: Diabetes mellitus without complication (HCC) No date: Dysrhythmia No date: Endometrial cancer (HCC) No date: GERD (gastroesophageal reflux disease) No date: History of radiation therapy     Comment:  Endometrial- HDR- 06/13/22-07/11/22- Dr. Antony Blackbird No date: Hypercholesteremia No date: Hypertension No date: Paroxysmal atrial fibrillation (HCC) No date: Paroxysmal atrial fibrillation [facility]) No  date: Sleep apnea  Past Surgical History: 12/30/2020: CARDIOVERSION; N/A     Comment:  Procedure: CARDIOVERSION;  Surgeon: Lamar Blinks,               MD;  Location: [facility] ORS;  Service: Cardiovascular;                Laterality: N/A; 11/2021: CATARACT EXTRACTION; Right 12/2021: CATARACT EXTRACTION; Left No date: COLONOSCOPY 06/29/2021: ESOPHAGOGASTRODUODENOSCOPY (EGD) WITH PROPOFOL; N/A     Comment:  Procedure: ESOPHAGOGASTRODUODENOSCOPY (EGD) WITH               PROPOFOL;  Surgeon: Midge Minium, MD;  Location: [facility]               ENDOSCOPY;  Service: Endoscopy;  Laterality: N/A; 04/12/2022: ROBOTIC ASSISTED TOTAL HYSTERECTOMY WITH BILATERAL  SALPINGO OOPHERECTOMY; Bilateral     Comment:  Procedure: XI ROBOTIC ASSISTED TOTAL HYSTERECTOMY WITH               BILATERAL SALPINGO OOPHORECTOMY;  Surgeon: Clide Cliff, MD;  Location: [facility] ORS;  Service: Gynecology;                Laterality: Bilateral; 04/12/2022: SENTINEL NODE BIOPSY; N/A     Comment:  Procedure: SENTINEL NODE INJECTION;  Surgeon: Clide Cliff, MD;  Location: [facility] ORS;  Service: Gynecology;                Laterality: N/A; 2000: TUBAL LIGATION  BMI    Body Mass Index: 58.99 kg/m      Reproductive/Obstetrics negative  OB ROS ENDOMETRIAL CANCER                             Anesthesia Physical Anesthesia Plan  ASA: 3  Anesthesia Plan: General   Post-op Pain Management: Minimal or no pain anticipated   Induction: Intravenous  PONV Risk Score and Plan: 3 and Treatment may vary due to age or medical condition, Ondansetron and Propofol infusion  Airway Management Planned: Nasal CPAP  Additional Equipment: None  Intra-op Plan:   Post-operative Plan: Extubation in OR  Informed Consent: I have reviewed the patients History and Physical, chart, labs and discussed the procedure including the risks, benefits and alternatives for the proposed anesthesia with  the patient or authorized representative who has indicated his/her understanding and acceptance.     Dental advisory given  Plan Discussed with: CRNA  Anesthesia Plan Comments: (Discussed risks of anesthesia with patient, including possibility of difficulty with spontaneous ventilation under anesthesia necessitating airway intervention, PONV, and rare risks such as cardiac or respiratory or neurological events, and allergic reactions. Discussed the role of CRNA in patient's perioperative care. Patient understands.)       Anesthesia Quick Evaluation

## 2022-08-18 NOTE — Interval H&P Note (Signed)
History and Physical Interval Note: Preprocedure H&P from 08/18/22  was reviewed and there was no interval change after seeing and examining the patient.  Written consent was obtained from the patient after discussion of risks, benefits, and alternatives. Patient has consented to proceed with Colonoscopy with possible intervention   08/18/2022 7:24 AM  Joan Graves  has presented today for surgery, with the diagnosis of  787.99 (ICD-9-CM) - R19.4 (ICD-10-CM) - Change in bowel habits  787.91 (ICD-9-CM) - K52.9 (ICD-10-CM) - Chronic diarrhea  787.91 (ICD-9-CM) - R19.7 (ICD-10-CM) - Nocturnal diarrhea  787.63 (ICD-9-CM) - R15.2 (ICD-10-CM) - Fecal urgency.  The various methods of treatment have been discussed with the patient and family. After consideration of risks, benefits and other options for treatment, the patient has consented to  Procedure(s) with comments: COLONOSCOPY WITH PROPOFOL (N/A) - IDDM as a surgical intervention.  The patient's history has been reviewed, patient examined, no change in status, stable for surgery.  I have reviewed the patient's chart and labs.  Questions were answered to the patient's satisfaction.     Jaynie Collins

## 2022-08-18 NOTE — Transfer of Care (Signed)
Immediate Anesthesia Transfer of Care Note  Patient: Joan Graves  Procedure(s) Performed: COLONOSCOPY WITH PROPOFOL  Patient Location: Endoscopy Unit  Anesthesia Type:General  Level of Consciousness: drowsy  Airway & Oxygen Therapy: Patient Spontanous Breathing  Post-op Assessment: Report given to RN  Post vital signs: stable  Last Vitals:  Vitals Value Taken Time  BP 96/43 08/18/22 0800  Temp 35.7 C 08/18/22 0800  Pulse 61 08/18/22 0801  Resp 16 08/18/22 0801  SpO2 92 % 08/18/22 0801  Vitals shown include unvalidated device data.  Last Pain:  Vitals:   08/18/22 0800  TempSrc: Temporal  PainSc: Asleep         Complications: No notable events documented.

## 2022-08-19 ENCOUNTER — Encounter: Payer: Self-pay | Admitting: Gastroenterology

## 2022-08-22 ENCOUNTER — Encounter: Payer: Self-pay | Admitting: Psychiatry

## 2022-08-22 ENCOUNTER — Inpatient Hospital Stay: Payer: 59 | Attending: Psychiatry | Admitting: Psychiatry

## 2022-08-22 VITALS — BP 125/73 | HR 73 | Temp 97.7°F | Resp 20 | Ht 63.78 in | Wt 338.6 lb

## 2022-08-22 DIAGNOSIS — Z923 Personal history of irradiation: Secondary | ICD-10-CM | POA: Insufficient documentation

## 2022-08-22 DIAGNOSIS — R21 Rash and other nonspecific skin eruption: Secondary | ICD-10-CM | POA: Insufficient documentation

## 2022-08-22 DIAGNOSIS — Z8542 Personal history of malignant neoplasm of other parts of uterus: Secondary | ICD-10-CM | POA: Diagnosis present

## 2022-08-22 DIAGNOSIS — C541 Malignant neoplasm of endometrium: Secondary | ICD-10-CM

## 2022-08-22 DIAGNOSIS — Z08 Encounter for follow-up examination after completed treatment for malignant neoplasm: Secondary | ICD-10-CM | POA: Insufficient documentation

## 2022-08-22 DIAGNOSIS — Z90722 Acquired absence of ovaries, bilateral: Secondary | ICD-10-CM | POA: Diagnosis not present

## 2022-08-22 DIAGNOSIS — B372 Candidiasis of skin and nail: Secondary | ICD-10-CM | POA: Diagnosis not present

## 2022-08-22 DIAGNOSIS — Z90710 Acquired absence of both cervix and uterus: Secondary | ICD-10-CM | POA: Diagnosis not present

## 2022-08-22 MED ORDER — CLOTRIMAZOLE 1 % EX LOTN
TOPICAL_LOTION | CUTANEOUS | 3 refills | Status: DC
Start: 2022-08-22 — End: 2022-10-04

## 2022-08-22 NOTE — Patient Instructions (Signed)
It was a pleasure to see you in clinic today. - Exam is normal today from a cancer perspective. - I recommend a fungal cream for your groin. Apply twice daily until the redness, itching is gone. Avoid lotions in this area. - You can pick up a steroid cream over the counter to place on rash on your abdomen. If not resolving, you may need assessment by dermatology - Return visit planned for 3 months. Plan CT scan in 6 months.  Thank you very much for allowing me to provide care for you today.  I appreciate your confidence in choosing our Gynecologic Oncology team at [facility].  If you have any questions about your visit today please call our office or send [place] a MyChart message and we will get back to you as soon as possible.

## 2022-08-22 NOTE — Progress Notes (Addendum)
Gynecologic Oncology Return Clinic Visit  Date of Service: 08/22/2022 Referring Provider: Nicholaus Bloom, MD   Assessment & Plan: Joan Graves is a 52 y.o. woman with incompletely staged Stage IB (FIGO 2023 staging) FIGO grade 2 endometrioid endometrial cancer (no LVSI, 64% MI, p53wt, MMRp, POLE neg) who is s/p RA-TLH, BSO on 04/12/22 and completed VBT 07/11/22.  Endometrial cancer: - NED on exam today. - Signs/symptoms of recurrence reviewed. - Continue surveillance with follow-up q52mo initially.  -Was plan to alternate follow-up with radiation oncology, but patient expressed wishes today to follow-up with me for all visits if feasible.  Will update Dr. Roselind Messier so he is aware. - Reviewed that after 5 years NED, will be safe to return to Ob/Gyn. - POLE testing negative. - Pt declined genetics referral.  - Given unable to surgically assess lymph nodes, recommend repeat CT in 37mo for surveillance.   Cutaneous candidiasis: -Recommend treatment with fungal cream. - Rx for clotrimazole sent to pharmacy.  Rash: -Recommend topical steroid cream, over-the-counter, for itching. - If rash persists or spreads, she may need further evaluation with PCP or dermatology.   RTC 3 months.  Clide Cliff, MD Gynecologic Oncology  Medical Decision Making I personally spent  TOTAL 30 minutes face-to-face and non-face-to-face in the care of this patient, which includes all pre, intra, and post visit time on the date of service.   ----------------------- Reason for Visit: Surveillance  Treatment History: Oncology History  Endometrial cancer (HCC)  02/15/2022 Initial Biopsy   Endometrial biopsy: FIGO grade 2 endometrioid adenocarcinoma   03/26/2022 Initial Diagnosis   Endometrial cancer (HCC)   04/12/2022 Surgery   Robotic-assisted total laparoscopic hysterectomy, bilateral salpingo-oophorectomy, bilateral sentinel lymph node evaluation (no biopsy due to morbid obesity and inability to  visualize lymph channels/sentinel lymph node)   04/12/2022 Pathology Results   A. UTERUS WITH RIGHT AND LEFT FALLOPIAN TUBE AND OVARY, HYSTERECTOMY AND  BILATERAL SALPINGO-OOPHORECTOMY:  Invasive well to moderately differentiated endometrioid adenocarcinoma  with squamous morular metaplasia, FIGO 2  Tumor invades greater than 50% of the myometrium within the lower  uterine segment (7 of 11 mm, 64%) (pT1b)  Margins free  Bilateral acute and chronic salpingitis, proximally  Benign ovaries   An immunohistochemical stain for p53 performed on the biopsy  ([id number]) exhibited a wild-type staining pattern.   IHC EXPRESSION RESULTS  TEST           RESULT  MLH1:          Preserved nuclear expression  MSH2:          Preserved nuclear expression  MSH6:          Preserved nuclear expression  PMS2:          Preserved nuclear expression    04/12/2022 Cancer Staging   Staging form: Corpus Uteri - Carcinoma and Carcinosarcoma, AJCC 8th Edition - Pathologic stage from 04/12/2022: FIGO Stage IB, calculated as Stage Unknown (pT1b, pNX, cM0) - Signed by Clide Cliff, MD on 03/26/2023 Histopathologic type: Endometrioid adenocarcinoma, NOS Stage prefix: Initial diagnosis Histologic grade (G): G2 Histologic grading system: 3 grade system Lymph-vascular invasion (LVI): LVI not present (absent)/not identified     Interval History: No new vaginal bleeding, abdominal/pelvic pain, unintentional weight loss, change in bowel or bladder habits, early satiety, bloating, nausea/vomiting. She reports that since surgery her appetite is up and down (some days she is hungry, some days she doesn't have much of an appetite).   Past Medical/Surgical History: Past Medical History:  Diagnosis  Date   Anemia    Anxiety    Aortic valve stenosis    Arthritis    Asthma    Bradycardia    Cancer (HCC)    endometrial cancer   Chronic pulmonary hypertension (HCC)    Depression    Diabetes mellitus without  complication (HCC)    Dysrhythmia    Endometrial cancer (HCC)    GERD (gastroesophageal reflux disease)    Heart murmur    History of radiation therapy    Endometrial- HDR- 06/13/22-07/11/22- Dr. Antony Blackbird   Hypercholesteremia    Hypertension    Paroxysmal atrial fibrillation (HCC)    Paroxysmal atrial fibrillation (HCC)    Sleep apnea     Past Surgical History:  Procedure Laterality Date   CARDIOVERSION N/A 12/30/2020   Procedure: CARDIOVERSION;  Surgeon: Lamar Blinks, MD;  Location: [facility] ORS;  Service: Cardiovascular;  Laterality: N/A;   CARDIOVERSION N/A 08/24/2022   Procedure: CARDIOVERSION;  Surgeon: Alwyn Pea, MD;  Location: [facility] ORS;  Service: Cardiovascular;  Laterality: N/A;   CATARACT EXTRACTION Right 11/2021   CATARACT EXTRACTION Left 12/2021   COLONOSCOPY     COLONOSCOPY WITH PROPOFOL N/A 08/18/2022   Procedure: COLONOSCOPY WITH PROPOFOL;  Surgeon: Jaynie Collins, DO;  Location: [facility] ENDOSCOPY;  Service: Gastroenterology;  Laterality: N/A;  IDDM   ESOPHAGOGASTRODUODENOSCOPY (EGD) WITH PROPOFOL N/A 06/29/2021   Procedure: ESOPHAGOGASTRODUODENOSCOPY (EGD) WITH PROPOFOL;  Surgeon: Midge Minium, MD;  Location: [facility] ENDOSCOPY;  Service: Endoscopy;  Laterality: N/A;   INTERSTIM IMPLANT PLACEMENT N/A 10/18/2022   Procedure: Leane Platt IMPLANT SECOND STAGE;  Surgeon: Alfredo Martinez, MD;  Location: [facility]S;  Service: Urology;  Laterality: N/A;  NEEDS TO BE INTUBATED WITH NO MUSCLE RELAXANT   NEEDS 75 MINS FOR THIS CASE   INTERSTIM IMPLANT PLACEMENT N/A 10/18/2022   Procedure: Leane Platt IMPLANT FIRST STAGE;  Surgeon: Alfredo Martinez, MD;  Location: [facility] ORS;  Service: Urology;  Laterality: N/A;   POLYPECTOMY  08/18/2022   Procedure: POLYPECTOMY;  Surgeon: Jaynie Collins, DO;  Location[facility] ENDOSCOPY;  Service: Gastroenterology;;   ROBOTIC ASSISTED TOTAL HYSTERECTOMY WITH BILATERAL SALPINGO OOPHERECTOMY Bilateral 04/12/2022   Procedure: XI ROBOTIC  ASSISTED TOTAL HYSTERECTOMY WITH BILATERAL SALPINGO OOPHORECTOMY;  Surgeon: Clide Cliff, MD;  Location: [facility] ORS;  Service: Gynecology;  Laterality: Bilateral;   SENTINEL NODE BIOPSY N/A 04/12/2022   Procedure: SENTINEL NODE INJECTION;  Surgeon: Clide Cliff, MD;  Location: [facility] ORS;  Service: Gynecology;  Laterality: N/A;   TUBAL LIGATION  2000    Family History  Problem Relation Age of Onset   Ovarian cancer Mother        mid 21s   Lung cancer Mother 17   Throat cancer Maternal Grandmother    Colon cancer Neg Hx    Breast cancer Neg Hx    Endometrial cancer Neg Hx    Pancreatic cancer Neg Hx    Prostate cancer Neg Hx     Social History   Socioeconomic History   Marital status: Married    Spouse name: Not on file   Number of children: Not on file   Years of education: Not on file   Highest education level: Not on file  Occupational History   Not on file  Tobacco Use   Smoking status: Former    Types: Cigarettes    Passive exposure: Past   Smokeless tobacco: Never  Vaping Use   Vaping status: Every Day   Substances: Nicotine, Flavoring  Substance and Sexual Activity  Alcohol use: Yes    Alcohol/week: 2.0 standard drinks of alcohol    Types: 1 Glasses of wine, 1 Shots of liquor per week    Comment: occ   Drug use: Never   Sexual activity: Yes    Birth control/protection: Surgical    Comment: Tubal Ligation  Other Topics Concern   Not on file  Social History Narrative   Lives with husband, Lyman Bishop.  Also lives with ex-husband.    Social Drivers of Corporate investment banker Strain: Not on file  Food Insecurity: Patient Declined (03/26/2022)   Hunger Vital Sign    Worried About Running Out of Food in the Last Year: Patient declined    Ran Out of Food in the Last Year: Patient declined  Transportation Needs: Not on file  Physical Activity: Not on file  Stress: Not on file  Social Connections: Not on file    Current Medications:  Current Outpatient  Medications:    ACCU-CHEK SOFTCLIX LANCETS lancets, CHECK GLUCOSE TWICE DIALY FOR GLUCOSE MONITORING E11.9, Disp: , Rfl: 5   ADVAIR DISKUS 500-50 MCG/DOSE AEPB, Inhale 2 puffs into the lungs daily., Disp: , Rfl: 5   amiodarone (PACERONE) 200 MG tablet, Take 400 mg by mouth daily., Disp: , Rfl:    amLODipine (NORVASC) 10 MG tablet, Take 1 tablet by mouth daily., Disp: , Rfl:    apixaban (ELIQUIS) 5 MG TABS tablet, Take 10 mg by mouth daily., Disp: , Rfl:    atorvastatin (LIPITOR) 20 MG tablet, Take 20 mg by mouth daily., Disp: , Rfl:    buPROPion (WELLBUTRIN SR) 100 MG 12 hr tablet, Take 200 mg by mouth daily., Disp: , Rfl:    cephALEXin (KEFLEX) 500 MG capsule, Take 1 capsule (500 mg total) by mouth 4 (four) times daily., Disp: 28 capsule, Rfl: 0   doxycycline (VIBRAMYCIN) 100 MG capsule, Take 1 capsule (100 mg total) by mouth 2 (two) times daily., Disp: 20 capsule, Rfl: 0   enalapril (VASOTEC) 20 MG tablet, Take 20 mg by mouth daily., Disp: , Rfl:    ferrous sulfate 325 (65 FE) MG EC tablet, Take 650 mg by mouth daily with breakfast., Disp: , Rfl:    furosemide (LASIX) 20 MG tablet, Take 20 mg by mouth daily., Disp: , Rfl:    glipiZIDE (GLUCOTROL XL) 10 MG 24 hr tablet, Take 10 mg by mouth daily., Disp: , Rfl:    glucose blood test strip, USE AS DIRECTED TWICE A DAY, Disp: , Rfl:    hydrochlorothiazide (MICROZIDE) 12.5 MG capsule, Take 12.5 mg by mouth daily., Disp: , Rfl:    HYDROcodone-acetaminophen (NORCO) 5-325 MG tablet, Take 1-2 tablets by mouth every 6 (six) hours as needed for moderate pain., Disp: 14 tablet, Rfl: 0   Insulin Glargine (BASAGLAR KWIKPEN) 100 UNIT/ML, Inject 20 Units into the skin at bedtime as needed (if CBG over 200)., Disp: , Rfl:    JARDIANCE 25 MG TABS tablet, Take 25 mg by mouth daily., Disp: , Rfl:    loratadine (CLARITIN) 10 MG tablet, Take 10 mg by mouth daily., Disp: , Rfl:    metFORMIN (GLUCOPHAGE-XR) 500 MG 24 hr tablet, Take 2,000 mg by mouth daily., Disp: ,  Rfl: 1   metoprolol tartrate (LOPRESSOR) 25 MG tablet, Take 37.5 mg by mouth daily., Disp: , Rfl:    naproxen sodium (ALEVE) 220 MG tablet, Take 220 mg by mouth daily as needed (headaches)., Disp: , Rfl:    omeprazole (PRILOSEC) 40 MG capsule, Take  40 mg by mouth daily., Disp: , Rfl:   Review of Symptoms: Complete 10-system review is positive for: Change in appetite, shortness of breath, anxiety, depression  Physical Exam: BP 125/73   Pulse 73   Temp 97.7 F (36.5 C)   Resp 20   Ht 5' 3.78" (1.62 m)   Wt (!) 338 lb 9.6 oz (153.6 kg)   LMP 10/06/2017 (Approximate) Comment: Pt did not have period for approx 4 years then started periods again this spring, irregular. neg preg test  SpO2 100%   BMI 58.52 kg/m  General: Alert, oriented, no acute distress. HEENT: Normocephalic, atraumatic. Neck symmetric without masses. Sclera anicteric.  Chest: Normal work of breathing. Clear to auscultation bilaterally.   Cardiovascular: Regular rate and rhythm, no murmurs. Abdomen: Soft, nontender.  Normoactive bowel sounds.   Robotic incisions healed.  Erythematous scaly rash, approximately 3 x 5 cm on the abdominal wall to the left of the umbilicus.   Extremities: Grossly normal range of motion.  Warm, well perfused.  No edema bilaterally. Chronic skin changes over both lower extremities Skin: Generalized erythema on vulva and groins.  In particular, under left pannus and in left groin, erythema consistent with candidiasis. Lymphatics: No cervical, supraclavicular, or inguinal lymphadenopathy GU: External genitalia with some diffuse erythema.  Speculum exam with well-healing vaginal cuff and no abnormal discharge. Bimanual exam reveals intact vaginal cuff, no nodularity, no pelvic mass. Exam chaperoned by Andrey Cota, RN    Laboratory & Radiologic Studies: None

## 2022-08-24 ENCOUNTER — Ambulatory Visit: Payer: 59 | Admitting: Certified Registered Nurse Anesthetist

## 2022-08-24 ENCOUNTER — Encounter: Payer: Self-pay | Admitting: Internal Medicine

## 2022-08-24 ENCOUNTER — Other Ambulatory Visit: Payer: Self-pay

## 2022-08-24 ENCOUNTER — Ambulatory Visit
Admission: RE | Admit: 2022-08-24 | Discharge: 2022-08-24 | Disposition: A | Discharge status: Home / Self Care | Payer: 59 | Attending: Internal Medicine | Admitting: Internal Medicine

## 2022-08-24 ENCOUNTER — Encounter
Admission: RE | Disposition: A | Discharge status: Home / Self Care | Payer: Self-pay | Source: Home / Self Care | Attending: Internal Medicine

## 2022-08-24 DIAGNOSIS — F419 Anxiety disorder, unspecified: Secondary | ICD-10-CM | POA: Insufficient documentation

## 2022-08-24 DIAGNOSIS — K219 Gastro-esophageal reflux disease without esophagitis: Secondary | ICD-10-CM | POA: Diagnosis not present

## 2022-08-24 DIAGNOSIS — J45909 Unspecified asthma, uncomplicated: Secondary | ICD-10-CM | POA: Diagnosis not present

## 2022-08-24 DIAGNOSIS — Z7901 Long term (current) use of anticoagulants: Secondary | ICD-10-CM | POA: Diagnosis not present

## 2022-08-24 DIAGNOSIS — R001 Bradycardia, unspecified: Secondary | ICD-10-CM

## 2022-08-24 DIAGNOSIS — G4733 Obstructive sleep apnea (adult) (pediatric): Secondary | ICD-10-CM | POA: Insufficient documentation

## 2022-08-24 DIAGNOSIS — E7800 Pure hypercholesterolemia, unspecified: Secondary | ICD-10-CM | POA: Diagnosis not present

## 2022-08-24 DIAGNOSIS — Z87891 Personal history of nicotine dependence: Secondary | ICD-10-CM | POA: Diagnosis not present

## 2022-08-24 DIAGNOSIS — E785 Hyperlipidemia, unspecified: Secondary | ICD-10-CM | POA: Diagnosis not present

## 2022-08-24 DIAGNOSIS — I480 Paroxysmal atrial fibrillation: Secondary | ICD-10-CM | POA: Insufficient documentation

## 2022-08-24 DIAGNOSIS — I350 Nonrheumatic aortic (valve) stenosis: Secondary | ICD-10-CM | POA: Insufficient documentation

## 2022-08-24 DIAGNOSIS — I10 Essential (primary) hypertension: Secondary | ICD-10-CM | POA: Diagnosis not present

## 2022-08-24 DIAGNOSIS — I4819 Other persistent atrial fibrillation: Secondary | ICD-10-CM

## 2022-08-24 DIAGNOSIS — E119 Type 2 diabetes mellitus without complications: Secondary | ICD-10-CM | POA: Insufficient documentation

## 2022-08-24 DIAGNOSIS — I4891 Unspecified atrial fibrillation: Secondary | ICD-10-CM

## 2022-08-24 HISTORY — PX: CARDIOVERSION: SHX1299

## 2022-08-24 LAB — GLUCOSE, CAPILLARY: Glucose-Capillary: 122 mg/dL — ABNORMAL HIGH (ref 70–99)

## 2022-08-24 SURGERY — CARDIOVERSION
Anesthesia: General

## 2022-08-24 MED ORDER — PHENYLEPHRINE 80 MCG/ML (10ML) SYRINGE FOR IV PUSH (FOR BLOOD PRESSURE SUPPORT)
PREFILLED_SYRINGE | INTRAVENOUS | Status: AC
  Filled 2022-08-24: qty 10

## 2022-08-24 MED ORDER — EPHEDRINE 5 MG/ML INJ
INTRAVENOUS | Status: AC
  Filled 2022-08-24: qty 5

## 2022-08-24 MED ORDER — PROPOFOL 10 MG/ML IV BOLUS
INTRAVENOUS | Status: DC | PRN
  Administered 2022-08-24: 40 mg via INTRAVENOUS
  Administered 2022-08-24: 20 mg via INTRAVENOUS
  Administered 2022-08-24: 60 mg via INTRAVENOUS
  Administered 2022-08-24: 30 mg via INTRAVENOUS

## 2022-08-24 MED ORDER — PROPOFOL 10 MG/ML IV BOLUS
INTRAVENOUS | Status: AC
  Filled 2022-08-24: qty 60

## 2022-08-24 MED ORDER — SODIUM CHLORIDE 0.9 % IV SOLN: INTRAVENOUS | Status: DC

## 2022-08-24 MED ORDER — DEXMEDETOMIDINE HCL IN NACL 80 MCG/20ML IV SOLN
INTRAVENOUS | Status: AC
  Filled 2022-08-24: qty 20

## 2022-08-24 NOTE — CV Procedure (Signed)
Electrical Cardioversion Procedure Note   Procedure: Electrical Cardioversion Indications:  Atrial Fibrillation  Procedure Details Consent: Risks of procedure as well as the alternatives and risks of each were explained to the (patient/caregiver).  Consent for procedure obtained. Time Out: Verified patient identification, verified procedure, site/side was marked, verified correct patient position, special equipment/implants available, medications/allergies/relevent history reviewed, required imaging and test results available.  Performed  Patient placed on cardiac monitor, pulse oximetry, supplemental oxygen as necessary.  Sedation given:  Propofol as per anesthesia Pacer pads placed anterior and posterior chest.  Cardioverted 2 time(s).  Cardioverted at 150J.  Evaluation Findings: Post procedure EKG shows: NSR Complications: None Patient did tolerate procedure well.   Loney Laurence MD 08/24/22

## 2022-08-24 NOTE — Transfer of Care (Signed)
Immediate Anesthesia Transfer of Care Note  Patient: Joan Graves  Procedure(s) Performed: CARDIOVERSION  Patient Location: Specials 4  Anesthesia Type:General  Level of Consciousness: drowsy  Airway & Oxygen Therapy: Patient Spontanous Breathing and Patient connected to nasal cannula oxygen  Post-op Assessment: Report given to RN and Post -op Vital signs reviewed and stable  Post vital signs: Reviewed and stable  Last Vitals:  Vitals Value Taken Time  BP 104/71 08/24/22 0747  Temp    Pulse 46 08/24/22 0747  Resp 15 08/24/22 0747  SpO2 93 % 08/24/22 0747    Last Pain:  Vitals:   08/24/22 0659  TempSrc: Oral  PainSc: 0-No pain         Complications: No notable events documented.

## 2022-08-24 NOTE — Anesthesia Postprocedure Evaluation (Signed)
Anesthesia Post Note  Patient: Joan Graves  Procedure(s) Performed: CARDIOVERSION  Patient location during evaluation: Specials Recovery Anesthesia Type: General Level of consciousness: awake and alert Pain management: pain level controlled Vital Signs Assessment: post-procedure vital signs reviewed and stable Respiratory status: spontaneous breathing, nonlabored ventilation, respiratory function stable and patient connected to nasal cannula oxygen Cardiovascular status: blood pressure returned to baseline and stable Postop Assessment: no apparent nausea or vomiting Anesthetic complications: no   No notable events documented.   Last Vitals:  Vitals:   08/24/22 0800 08/24/22 0815  BP: (!) 76/57 104/63  Pulse: (!) 47 (!) 51  Resp: 14 15  Temp:    SpO2: 100% 98%    Last Pain:  Vitals:   08/24/22 0815  TempSrc:   PainSc: 0-No pain                 Cleda Mccreedy Krissi Willaims

## 2022-08-24 NOTE — Anesthesia Preprocedure Evaluation (Signed)
Anesthesia Evaluation  Patient identified by MRN, date of birth, ID band Patient awake    Reviewed: Allergy & Precautions, NPO status , Patient's Chart, lab work & pertinent test results  History of Anesthesia Complications Negative for: history of anesthetic complications  Airway Mallampati: III  TM Distance: <3 FB Neck ROM: full    Dental  (+) Chipped, Poor Dentition   Pulmonary neg shortness of breath, asthma , sleep apnea , Patient abstained from smoking., former smoker   Pulmonary exam normal        Cardiovascular Exercise Tolerance: Good hypertension, + dysrhythmias Atrial Fibrillation  Rhythm:irregular Rate:Normal     Neuro/Psych  PSYCHIATRIC DISORDERS      negative neurological ROS     GI/Hepatic Neg liver ROS,GERD  Controlled,,  Endo/Other  diabetes, Type 2    Renal/GU negative Renal ROS  negative genitourinary   Musculoskeletal   Abdominal   Peds  Hematology negative hematology ROS (+)   Anesthesia Other Findings Past Medical History: No date: Anemia No date: Anxiety No date: Aortic valve stenosis No date: Asthma No date: Bradycardia No date: Cancer [facility])     Comment:  endometrial cancer No date: Chronic pulmonary hypertension (HCC) No date: Depression No date: Diabetes mellitus without complication (HCC) No date: Dysrhythmia No date: Endometrial cancer (HCC) No date: GERD (gastroesophageal reflux disease) No date: History of radiation therapy     Comment:  Endometrial- HDR- 06/13/22-07/11/22- Dr. Antony Blackbird No date: Hypercholesteremia No date: Hypertension No date: Paroxysmal atrial fibrillation (HCC) No date: Paroxysmal atrial fibrillation [facility]) No date: Sleep apnea  Past Surgical History: 12/30/2020: CARDIOVERSION; N/A     Comment:  Procedure: CARDIOVERSION;  Surgeon: Lamar Blinks,               MD;  Location: [facility] ORS;  Service: Cardiovascular;                Laterality:  N/A; 11/2021: CATARACT EXTRACTION; Right 12/2021: CATARACT EXTRACTION; Left No date: COLONOSCOPY 08/18/2022: COLONOSCOPY WITH PROPOFOL; N/A     Comment:  Procedure: COLONOSCOPY WITH PROPOFOL;  Surgeon: Jaynie Collins, DO;  Location: [facility] ENDOSCOPY;  Service:               Gastroenterology;  Laterality: N/A;  IDDM 06/29/2021: ESOPHAGOGASTRODUODENOSCOPY (EGD) WITH PROPOFOL; N/A     Comment:  Procedure: ESOPHAGOGASTRODUODENOSCOPY (EGD) WITH               PROPOFOL;  Surgeon: Midge Minium, MD;  Location: [facility]               ENDOSCOPY;  Service: Endoscopy;  Laterality: N/A; 08/18/2022: POLYPECTOMY     Comment:  Procedure: POLYPECTOMY;  Surgeon: Jaynie Collins,              DO;  Location: [facility] ENDOSCOPY;  Service:               Gastroenterology;; 04/12/2022: ROBOTIC ASSISTED TOTAL HYSTERECTOMY WITH BILATERAL  SALPINGO OOPHERECTOMY; Bilateral     Comment:  Procedure: XI ROBOTIC ASSISTED TOTAL HYSTERECTOMY WITH               BILATERAL SALPINGO OOPHORECTOMY;  Surgeon: Clide Cliff, MD;  Location: [facility] ORS;  Service: Gynecology;                Laterality: Bilateral; 04/12/2022: SENTINEL NODE BIOPSY;  N/A     Comment:  Procedure: SENTINEL NODE INJECTION;  Surgeon: Clide Cliff, MD;  Location: [facility] ORS;  Service: Gynecology;                Laterality: N/A; 2000: TUBAL LIGATION  BMI    Body Mass Index: 59.52 kg/m      Reproductive/Obstetrics negative OB ROS                             Anesthesia Physical Anesthesia Plan  ASA: 3  Anesthesia Plan: General   Post-op Pain Management:    Induction: Intravenous  PONV Risk Score and Plan: Propofol infusion and TIVA  Airway Management Planned: Natural Airway and Nasal Cannula  Additional Equipment:   Intra-op Plan:   Post-operative Plan:   Informed Consent: I have reviewed the patients History and Physical, chart, labs and discussed the procedure  including the risks, benefits and alternatives for the proposed anesthesia with the patient or authorized representative who has indicated his/her understanding and acceptance.     Dental Advisory Given  Plan Discussed with: Anesthesiologist, CRNA and Surgeon  Anesthesia Plan Comments: (Patient consented for risks of anesthesia including but not limited to:  - adverse reactions to medications - risk of airway placement if required - damage to eyes, teeth, lips or other oral mucosa - nerve damage due to positioning  - sore throat or hoarseness - Damage to heart, brain, nerves, lungs, other parts of body or loss of life  Patient voiced understanding.)       Anesthesia Quick Evaluation

## 2022-08-30 ENCOUNTER — Emergency Department: Payer: 59

## 2022-08-30 ENCOUNTER — Other Ambulatory Visit: Payer: Self-pay

## 2022-08-30 ENCOUNTER — Emergency Department
Admission: EM | Admit: 2022-08-30 | Discharge: 2022-08-30 | Disposition: A | Discharge status: Home / Self Care | Payer: 59 | Attending: Emergency Medicine | Admitting: Emergency Medicine

## 2022-08-30 DIAGNOSIS — R0789 Other chest pain: Secondary | ICD-10-CM

## 2022-08-30 DIAGNOSIS — Z7901 Long term (current) use of anticoagulants: Secondary | ICD-10-CM | POA: Diagnosis not present

## 2022-08-30 DIAGNOSIS — E119 Type 2 diabetes mellitus without complications: Secondary | ICD-10-CM | POA: Insufficient documentation

## 2022-08-30 DIAGNOSIS — R0602 Shortness of breath: Secondary | ICD-10-CM | POA: Insufficient documentation

## 2022-08-30 DIAGNOSIS — I10 Essential (primary) hypertension: Secondary | ICD-10-CM | POA: Insufficient documentation

## 2022-08-30 DIAGNOSIS — R0609 Other forms of dyspnea: Secondary | ICD-10-CM

## 2022-08-30 LAB — BASIC METABOLIC PANEL
Anion gap: 10 (ref 5–15)
BUN: 11 mg/dL (ref 6–20)
CO2: 25 mmol/L (ref 22–32)
Calcium: 8.8 mg/dL — ABNORMAL LOW (ref 8.9–10.3)
Chloride: 103 mmol/L (ref 98–111)
Creatinine, Ser: 0.68 mg/dL (ref 0.44–1.00)
GFR, Estimated: >60 mL/min (ref >60)
Glucose, Bld: 139 mg/dL — ABNORMAL HIGH (ref 70–99)
Potassium: 4 mmol/L (ref 3.5–5.1)
Sodium: 138 mmol/L (ref 135–145)

## 2022-08-30 LAB — PROTIME-INR
INR: 1.4 — ABNORMAL HIGH (ref 0.8–1.2)
Prothrombin Time: 17.7 seconds — ABNORMAL HIGH (ref 11.4–15.2)

## 2022-08-30 LAB — CBC
HCT: 34.7 % — ABNORMAL LOW (ref 36.0–46.0)
Hemoglobin: 12.8 g/dL (ref 12.0–15.0)
MCH: 34.2 pg — ABNORMAL HIGH (ref 26.0–34.0)
MCHC: 36.9 g/dL — ABNORMAL HIGH (ref 30.0–36.0)
MCV: 92.8 fL (ref 80.0–100.0)
Platelets: 195 10*3/uL (ref 150–400)
RBC: 3.74 MIL/uL — ABNORMAL LOW (ref 3.87–5.11)
RDW: 18.5 % — ABNORMAL HIGH (ref 11.5–15.5)
WBC: 6.3 10*3/uL (ref 4.0–10.5)
nRBC: 0 % (ref 0.0–0.2)

## 2022-08-30 LAB — TROPONIN I (HIGH SENSITIVITY)
Troponin I (High Sensitivity): 8 ng/L (ref <18)
Troponin I (High Sensitivity): 8 ng/L (ref <18)

## 2022-08-30 LAB — BRAIN NATRIURETIC PEPTIDE: B Natriuretic Peptide: 172.2 pg/mL — ABNORMAL HIGH (ref 0.0–100.0)

## 2022-08-30 LAB — APTT: aPTT: 38 seconds — ABNORMAL HIGH (ref 24–36)

## 2022-08-30 LAB — D-DIMER, QUANTITATIVE: D-Dimer, Quant: 0.82 ug/mL-FEU — ABNORMAL HIGH (ref 0.00–0.50)

## 2022-08-30 MED ORDER — IOHEXOL 350 MG/ML SOLN
75.0000 mL | Freq: Once | INTRAVENOUS | Status: AC | PRN
  Administered 2022-08-30: 75 mL via INTRAVENOUS

## 2022-08-30 NOTE — ED Provider Notes (Signed)
[facility] Provider Note    Event Date/Time   First MD Initiated Contact with Patient 08/30/22 1337     (approximate)   History   Chest Pain and Shortness of Breath   HPI  Joan Graves is a 52 y.o. female   Past medical history of hypertension, diabetes, hyperlipidemia, mild aortic valve stenosis, paroxysmal atrial fibrillation status post cardioversion last week, who presents to the emergency department with chest pressure and exertional shortness of breath ever since her cardioversion last week.  She inadvertently stopped her amiodarone for approximately 4 days but then restarted yesterday after meeting with her cardiologist informed her that she should continue this medication.  She has been continuing her Eliquis.  She denies any cough or fever.  Denies any GI or GU complaints.  She uses a vape but is unclear whether its nicotine or marijuana products.  External Medical Documents Reviewed: Cardiology note from 08/05/2022 addressing her paroxysmal atrial fibrillation with a plan for cardioversion and weaning of amiodarone      Physical Exam   Triage Vital Signs: ED Triage Vitals  Enc Vitals Group     BP 08/30/22 1333 129/71     Pulse Rate 08/30/22 1333 60     Resp 08/30/22 1333 18     Temp 08/30/22 1333 98 F (36.7 C)     Temp Source 08/30/22 1333 Oral     SpO2 08/30/22 1333 100 %     Weight 08/30/22 1331 (!) 330 lb (149.7 kg)     Height 08/30/22 1331 5\' 3"  (1.6 m)     Head Circumference --      Peak Flow --      Pain Score 08/30/22 1331 6     Pain Loc --      Pain Edu? --      Excl. in GC? --     Most recent vital signs: Vitals:   08/30/22 1333 08/30/22 1400  BP: 129/71 120/64  Pulse: 60 (!) 58  Resp: 18 19  Temp: 98 F (36.7 C)   SpO2: 100% 100%    General: Awake, no distress.  CV:  Good peripheral perfusion.  Resp:  Normal effort. Abd:  No distention.  Other:  Awake alert comfortable with normal vital signs no  respiratory distress clear lungs, soft nontender abdomen and appears euvolemic.  No hypoxemia on room air.   ED Results / Procedures / Treatments   Labs (all labs ordered are listed, but only abnormal results are displayed) Labs Reviewed  BASIC METABOLIC PANEL - Abnormal; Notable for the following components:      Result Value   Glucose, Bld 139 (*)    Calcium 8.8 (*)    All other components within normal limits  CBC - Abnormal; Notable for the following components:   RBC 3.74 (*)    HCT 34.7 (*)    MCH 34.2 (*)    MCHC 36.9 (*)    RDW 18.5 (*)    All other components within normal limits  D-DIMER, QUANTITATIVE - Abnormal; Notable for the following components:   D-Dimer, Quant 0.82 (*)    All other components within normal limits  APTT - Abnormal; Notable for the following components:   aPTT 38 (*)    All other components within normal limits  PROTIME-INR - Abnormal; Notable for the following components:   Prothrombin Time 17.7 (*)    INR 1.4 (*)    All other components within normal limits  BRAIN  NATRIURETIC PEPTIDE - Abnormal; Notable for the following components:   B Natriuretic Peptide 172.2 (*)    All other components within normal limits  TROPONIN I (HIGH SENSITIVITY)  TROPONIN I (HIGH SENSITIVITY)     I ordered and reviewed the above labs they are notable for a troponin is negative.  D-dimer elevated at 0.82  EKG  ED ECG REPORT I, Pilar Jarvis, the attending physician, personally viewed and interpreted this ECG.   Date: 08/30/2022  EKG Time: 1337  Rate: 60  Rhythm: nsr  Axis: nl  Intervals:none  ST&T Change: no stemi    RADIOLOGY I independently reviewed and interpreted chest x-ray and see no obvious focality or pneumothorax   PROCEDURES:  Critical Care performed: No  Procedures   MEDICATIONS ORDERED IN ED: Medications  iohexol (OMNIPAQUE) 350 MG/ML injection 75 mL (75 mLs Intravenous Contrast Given 08/30/22 1427)    IMPRESSION / MDM /  ASSESSMENT AND PLAN / ED COURSE  I reviewed the triage vital signs and the nursing notes.                                Patient's presentation is most consistent with acute presentation with potential threat to life or bodily function.  Differential diagnosis includes, but is not limited to, ACS, PE, bacterial pneumonia, respiratory infection, aortic stenosis   The patient is on the cardiac monitor to evaluate for evidence of arrhythmia and/or significant heart rate changes.  MDM: Patient with cardioversion now in normal sinus rhythm but since that procedure she has had some exertional dyspnea and chest pressure.  She has continued with her anticoagulation but has had some confusion with her other medications and discontinued amiodarone for a few days before restarting.  I considered ACS given her symptoms but EKG is nonischemic and initial troponin is normal, will follow-up with serial troponin.  I considered PE however I think this is less likely given her anticoagulated status.  Her symptoms do match with chest pressure and exertional shortness of breath, and D-dimer is elevated, CT angiogram was negative for PE however.  Patient with mild aortic stenosis with an echo last performed in 2023, consider this may be a factor in her symptoms.   Plan will be for ACS/PE workup as above, if negative and remains stable will follow-up closely with cardiology for further evaluation as needed.        FINAL CLINICAL IMPRESSION(S) / ED DIAGNOSES   Final diagnoses:  Exertional dyspnea  Atypical chest pain     Rx / DC Orders   ED Discharge Orders          Ordered    Ambulatory referral to Cardiology       Comments: If you have not heard from the Cardiology office within the next 72 hours please call [phone].   08/30/22 1541             Note:  This document was prepared using Dragon voice recognition software and may include unintentional dictation errors.    Pilar Jarvis, MD 08/30/22 [phone]

## 2022-08-30 NOTE — Discharge Instructions (Signed)
Call your cardiologist follow-up appointment as soon as possible.  Fortunately your testing today showed no signs of heart attack or blood clot to account for your symptoms.  Continue taking all medications as prescribed.

## 2022-08-30 NOTE — ED Triage Notes (Signed)
Pt arrived via Wheel chair from [facility]. Per pt she had a cardioversion done on 08/24/22 and since than pt sts that she has been having SOB and CP.

## 2022-08-30 NOTE — ED Notes (Signed)
Patient transported to CT 

## 2022-09-14 ENCOUNTER — Other Ambulatory Visit
Admission: RE | Admit: 2022-09-14 | Discharge: 2022-09-14 | Disposition: A | Discharge status: Home / Self Care | Payer: 59 | Source: Ambulatory Visit | Attending: Nurse Practitioner | Admitting: Nurse Practitioner

## 2022-09-14 DIAGNOSIS — R600 Localized edema: Secondary | ICD-10-CM | POA: Insufficient documentation

## 2022-09-14 LAB — BRAIN NATRIURETIC PEPTIDE: B Natriuretic Peptide: 64.3 pg/mL (ref 0.0–100.0)

## 2022-09-18 ENCOUNTER — Encounter: Payer: Self-pay | Admitting: Emergency Medicine

## 2022-09-18 ENCOUNTER — Ambulatory Visit
Admission: EM | Admit: 2022-09-18 | Discharge: 2022-09-18 | Disposition: A | Discharge status: Home / Self Care | Payer: 59 | Attending: Internal Medicine | Admitting: Internal Medicine

## 2022-09-18 DIAGNOSIS — L02212 Cutaneous abscess of back [any part, except buttock]: Secondary | ICD-10-CM

## 2022-09-18 DIAGNOSIS — L02219 Cutaneous abscess of trunk, unspecified: Secondary | ICD-10-CM | POA: Diagnosis not present

## 2022-09-18 MED ORDER — SULFAMETHOXAZOLE-TRIMETHOPRIM 800-160 MG PO TABS: 1.0000 | ORAL_TABLET | Freq: Two times a day (BID) | ORAL | 0 refills | Status: AC

## 2022-09-18 NOTE — Discharge Instructions (Signed)
You were seen today for an abscess of your right lower back and your left suprapubic area.  The area on your left suprapubic area was drained.  I am putting you on antibiotics twice daily for the next 10 days.  I recommend warm compresses 3 times daily.  Please follow-up with your PCP for evaluation tomorrow.

## 2022-09-18 NOTE — ED Triage Notes (Signed)
Pt has abscess in her vagina area and on her lower back. Started about a week ago. She states the vaginal abscess started draining this morning.

## 2022-09-18 NOTE — ED Provider Notes (Signed)
[facility]    CSN: [id number] Arrival date & time: 09/18/22  0865      History   Chief Complaint Chief Complaint  Patient presents with   Abscess    HPI Joan Graves is a 52 y.o. female with a history of HTN, HLD, DM2 who presents to urgent care today with complaint of an abscess on her right lower back and her upper vaginal area.  She noticed this 1 week ago.  She reports the 1 on her back has not drained.  The one on her upper vaginal area started to drain blood this morning.  She denies fever, chills, nausea, vomiting or diarrhea.  She has not tried anything OTC for this.  HPI  Past Medical History:  Diagnosis Date   Anemia    Anxiety    Aortic valve stenosis    Asthma    Bradycardia    Cancer (HCC)    endometrial cancer   Chronic pulmonary hypertension (HCC)    Depression    Diabetes mellitus without complication (HCC)    Dysrhythmia    Endometrial cancer (HCC)    GERD (gastroesophageal reflux disease)    History of radiation therapy    Endometrial- HDR- 06/13/22-07/11/22- Dr. Antony Blackbird   Hypercholesteremia    Hypertension    Paroxysmal atrial fibrillation (HCC)    Paroxysmal atrial fibrillation [facility])    Sleep apnea     Patient Active Problem List   Diagnosis Date Noted   Cellulitis of nose 03/26/2022   Endometrial cancer (HCC) 03/26/2022   Diabetes mellitus without complication (HCC) 03/26/2022   Morbid obesity with BMI of 50.0-59.9, adult (HCC) 03/26/2022   HLD (hyperlipidemia) 03/26/2022   Depression with anxiety 03/26/2022   Abscess of nose 03/26/2022   Family history of ovarian cancer 02/03/2022   PMB (postmenopausal bleeding) 02/03/2022   Esophageal dysphagia    Problems with swallowing and mastication    Paroxysmal atrial fibrillation (HCC) 12/09/2020   Chronic pulmonary hypertension (HCC) 06/11/2020   Moderate aortic valve stenosis 06/11/2020   Benign essential hypertension 12/02/2014   Hyperlipidemia, mixed  12/11/2013   Obstructive sleep apnea 12/11/2013   Asthma 12/09/2013   Diabetes (HCC) 12/09/2013   Gastroesophageal reflux disease 12/09/2013    Past Surgical History:  Procedure Laterality Date   CARDIOVERSION N/A 12/30/2020   Procedure: CARDIOVERSION;  Surgeon: Lamar Blinks, MD;  Location: [facility] ORS;  Service: Cardiovascular;  Laterality: N/A;   CARDIOVERSION N/A 08/24/2022   Procedure: CARDIOVERSION;  Surgeon: Alwyn Pea, MD;  Location: [facility] ORS;  Service: Cardiovascular;  Laterality: N/A;   CATARACT EXTRACTION Right 11/2021   CATARACT EXTRACTION Left 12/2021   COLONOSCOPY     COLONOSCOPY WITH PROPOFOL N/A 08/18/2022   Procedure: COLONOSCOPY WITH PROPOFOL;  Surgeon: Jaynie Collins, DO;  Location: [facility] ENDOSCOPY;  Service: Gastroenterology;  Laterality: N/A;  IDDM   ESOPHAGOGASTRODUODENOSCOPY (EGD) WITH PROPOFOL N/A 06/29/2021   Procedure: ESOPHAGOGASTRODUODENOSCOPY (EGD) WITH PROPOFOL;  Surgeon: Midge Minium, MD;  Location: [facility] ENDOSCOPY;  Service: Endoscopy;  Laterality: N/A;   POLYPECTOMY  08/18/2022   Procedure: POLYPECTOMY;  Surgeon: Jaynie Collins, DO;  Location: [facility] ENDOSCOPY;  Service: Gastroenterology;;   ROBOTIC ASSISTED TOTAL HYSTERECTOMY WITH BILATERAL SALPINGO OOPHERECTOMY Bilateral 04/12/2022   Procedure: XI ROBOTIC ASSISTED TOTAL HYSTERECTOMY WITH BILATERAL SALPINGO OOPHORECTOMY;  Surgeon: Clide Cliff, MD;  Location: [facility] ORS;  Service: Gynecology;  Laterality: Bilateral;   SENTINEL NODE BIOPSY N/A 04/12/2022   Procedure: SENTINEL NODE INJECTION;  Surgeon: Clide Cliff, MD;  Location: [facility] ORS;  Service: Gynecology;  Laterality: N/A;   TUBAL LIGATION  2000    OB History     Gravida  1   Para  1   Term  1   Preterm      AB      Living  1      SAB      IAB      Ectopic      Multiple      Live Births  1            Home Medications    Prior to Admission medications   Medication Sig Start Date End Date Taking?  Authorizing Provider  ADVAIR DISKUS 500-50 MCG/DOSE AEPB Inhale 1 puff into the lungs in the morning and at bedtime. 04/24/17  Yes [provider]  amiodarone (PACERONE) 200 MG tablet Take 200 mg by mouth daily.   Yes [provider]  amLODipine (NORVASC) 10 MG tablet Take 1 tablet by mouth daily. 10/17/18  Yes [provider]  apixaban (ELIQUIS) 5 MG TABS tablet Take 5 mg by mouth 2 (two) times daily.   Yes [provider]  atorvastatin (LIPITOR) 20 MG tablet Take 20 mg by mouth daily. 10/04/19  Yes [provider]  buPROPion (WELLBUTRIN SR) 100 MG 12 hr tablet Take 100 mg by mouth 2 (two) times daily. 08/10/20  Yes [provider]  enalapril (VASOTEC) 20 MG tablet Take 20 mg by mouth daily. 02/13/20  Yes [provider]  ferrous sulfate 325 (65 FE) MG EC tablet Take 650 mg by mouth daily with breakfast. 11/30/15  Yes [provider]  glipiZIDE (GLUCOTROL XL) 10 MG 24 hr tablet Take 10 mg by mouth daily. 06/21/22  Yes [provider]  Insulin Glargine (BASAGLAR KWIKPEN) 100 UNIT/ML Inject 20 Units into the skin at bedtime as needed (if CBG over 200). 03/28/22  Yes Marrion Coy, MD  JARDIANCE 25 MG TABS tablet Take 25 mg by mouth daily. 07/16/20  Yes [provider]  metFORMIN (GLUCOPHAGE-XR) 500 MG 24 hr tablet Take 1,000 mg by mouth 2 (two) times daily with a meal. 05/26/17  Yes [provider]  metoprolol tartrate (LOPRESSOR) 25 MG tablet Take 12.5 mg by mouth 3 (three) times daily. 03/25/22  Yes [provider]  omeprazole (PRILOSEC) 40 MG capsule Take 40 mg by mouth daily. 12/15/21  Yes [provider]  sulfamethoxazole-trimethoprim (BACTRIM DS) 800-160 MG tablet Take 1 tablet by mouth 2 (two) times daily for 10 days. 09/18/22 09/28/22 Yes Najiyah Paris, Salvadore Oxford, NP  ACCU-CHEK SOFTCLIX LANCETS lancets CHECK GLUCOSE TWICE DIALY FOR GLUCOSE MONITORING E11.9 06/09/17   [provider]   Clotrimazole 1 % LOTN Apply to red affected area in groin twice daily until redness/itching is gone. 08/22/22   Clide Cliff, MD  dicyclomine (BENTYL) 20 MG tablet Take 20 mg by mouth 2 (two) times daily. 05/04/22   [provider]  fluconazole (DIFLUCAN) 150 MG tablet Take 1 tablet (150 mg total) by mouth daily. Take second tablet 72 hours later Patient not taking: Reported on 08/18/2022 06/06/22   Antony Blackbird, MD  glucose blood test strip USE AS DIRECTED TWICE A DAY 07/08/15   [provider]    Family History Family History  Problem Relation Age of Onset   Ovarian cancer Mother        mid 76s   Lung cancer Mother 17   Throat cancer Maternal Grandmother    Colon  cancer Neg Hx    Breast cancer Neg Hx    Endometrial cancer Neg Hx    Pancreatic cancer Neg Hx    Prostate cancer Neg Hx     Social History Social History   Tobacco Use   Smoking status: Former    Types: Cigarettes    Passive exposure: Past   Smokeless tobacco: Never  Vaping Use   Vaping status: Every Day   Substances: Nicotine, Flavoring  Substance Use Topics   Alcohol use: Not Currently    Comment: occ   Drug use: Never     Allergies   Aspirin   Review of Systems Review of Systems  Constitutional:  Negative for chills and fever.  Gastrointestinal:  Negative for diarrhea, nausea and vomiting.  Skin:        Patient reports abscess to right lower back and upper vaginal area.  Neurological:  Negative for dizziness, weakness and headaches.     Physical Exam Triage Vital Signs ED Triage Vitals  Encounter Vitals Group     BP 09/18/22 0927 115/75     Systolic BP Percentile --      Diastolic BP Percentile --      Pulse Rate 09/18/22 0927 74     Resp 09/18/22 0927 16     Temp 09/18/22 0927 98.5 F (36.9 C)     Temp Source 09/18/22 0927 Oral     SpO2 09/18/22 0927 95 %     Weight 09/18/22 0925 (!) 330 lb 0.5 oz (149.7 kg)     Height 09/18/22 0925 5\' 3"  (1.6 m)     Head  Circumference --      Peak Flow --      Pain Score 09/18/22 0924 8     Pain Loc --      Pain Education --      Exclude from Growth Chart --    No data found.  Updated Vital Signs BP 115/75 (BP Location: Left Arm)   Pulse 74   Temp 98.5 F (36.9 C) (Oral)   Resp 16   Ht 5\' 3"  (1.6 m)   Wt (!) 330 lb 0.5 oz (149.7 kg)   LMP 10/06/2017 (Approximate) Comment: Pt did not have period for approx 4 years then started periods again this spring, irregular. neg preg test  SpO2 95%   BMI 58.46 kg/m   Visual Acuity Right Eye Distance:   Left Eye Distance:   Bilateral Distance:    Right Eye Near:   Left Eye Near:    Bilateral Near:     Physical Exam Constitutional:      General: She is not in acute distress.    Appearance: She is obese.  Cardiovascular:     Rate and Rhythm: Normal rate. Rhythm irregular.     Heart sounds: Normal heart sounds.  Pulmonary:     Effort: Pulmonary effort is normal.     Breath sounds: Normal breath sounds.  Skin:    Comments: 1.5 cm nonfluctuant abscess noted of right lower back.  3 cm fluctuant abscess noted in the left suprapubic area.  Both areas have surrounding areas of cellulitis.  Neurological:     Mental Status: She is alert and oriented to person, place, and time.      UC Treatments / Results  Labs   EKG   Radiology No results found.  Procedures Incision and Drainage  Date/Time: 09/18/2022 9:43 AM  Performed by: Lorre Munroe, NP Authorized by: Lorre Munroe,  NP   Consent:    Consent obtained:  Verbal   Consent given by:  Patient   Risks, benefits, and alternatives were discussed: yes     Risks discussed:  Bleeding, incomplete drainage, pain and infection   Alternatives discussed:  No treatment Universal protocol:    Procedure explained and questions answered to patient or proxy's satisfaction: yes     Relevant documents present and verified: no     Test results available : no     Imaging studies available: no      Required blood products, implants, devices, and special equipment available: no     Site/side marked: yes     Immediately prior to procedure, a time out was called: yes     Patient identity confirmed:  Verbally with patient and arm band Location:    Type:  Abscess   Size:  3 cm   Location: Suprapubic area. Pre-procedure details:    Skin preparation:  Povidone-iodine Sedation:    Sedation type:  None Anesthesia:    Anesthesia method:  Local infiltration   Local anesthetic:  Lidocaine 1% WITH epi Procedure type:    Complexity:  Simple Procedure details:    Ultrasound guidance: no     Needle aspiration: no     Incision types:  Stab incision   Incision depth:  Dermal   Wound management:  Probed and deloculated   Drainage:  Bloody and purulent   Drainage amount:  Moderate   Wound treatment:  Wound left open   Packing materials:  None Post-procedure details:    Procedure completion:  Tolerated well, no immediate complications  (including critical care time)  Medications Ordered in UC Medications - No data to display  Initial Impression / Assessment and Plan / UC Course  I have reviewed the triage vital signs and the nursing notes.  Pertinent labs & imaging results that were available during my care of the patient were reviewed by me and considered in my medical decision making (see chart for details).     52 year old female with 1 week history of abscess to right lower back and left suprapubic area.  Abscess on the back is unable to be drained at this time because it is so hard and nonfluctuant.  I&D performed on the abscess in the left suprapubic area.  Rx for Septra DS 1 tab p.o. twice daily x 10 days.  Encouraged warm compresses 3 times daily.  Advised her to follow-up with her PCP tomorrow for reevaluation of her symptoms.  Final Clinical Impressions(s) / UC Diagnoses   Final diagnoses:  Abscess of lower back  Abscess of suprapubic region     Discharge Instructions       You were seen today for an abscess of your right lower back and your left suprapubic area.  The area on your left suprapubic area was drained.  I am putting you on antibiotics twice daily for the next 10 days.  I recommend warm compresses 3 times daily.  Please follow-up with your PCP for evaluation tomorrow.     ED Prescriptions     Medication Sig Dispense Auth. Provider   sulfamethoxazole-trimethoprim (BACTRIM DS) 800-160 MG tablet Take 1 tablet by mouth 2 (two) times daily for 10 days. 20 tablet Lorre Munroe, NP      PDMP not reviewed this encounter.   Lorre Munroe, NP 09/18/22 [phone]

## 2022-09-20 ENCOUNTER — Other Ambulatory Visit: Payer: Self-pay | Admitting: Urology

## 2022-09-29 HISTORY — PX: OTHER SURGICAL HISTORY: SHX169

## 2022-10-03 NOTE — Patient Instructions (Signed)
SURGICAL WAITING ROOM VISITATION  Patients having surgery or a procedure may have no more than 2 support people in the waiting area - these visitors may rotate.    Children under the age of 57 must have an adult with them who is not the patient.  Due to an increase in RSV and influenza rates and associated hospitalizations, children ages 89 and under may not visit patients in [facility] hospitals.  If the patient needs to stay at the hospital during part of their recovery, the visitor guidelines for inpatient rooms apply. Pre-op nurse will coordinate an appropriate time for 1 support person to accompany patient in pre-op.  This support person may not rotate.    Please refer to the [facility] website for the visitor guidelines for Inpatients (after your surgery is over and you are in a regular room).       Your procedure is scheduled on: 10/18/22   Report to [facility] Main Entrance    Report to admitting at 7:15 AM   Call this number if you have problems the morning of surgery [phone]   Do not eat food :After Midnight       Oral Hygiene is also important to reduce your risk of infection.                                    Remember - BRUSH YOUR TEETH THE MORNING OF SURGERY WITH YOUR REGULAR TOOTHPASTE  DENTURES WILL BE REMOVED PRIOR TO SURGERY PLEASE DO NOT APPLY "Poly grip" OR ADHESIVES!!!   Do NOT smoke after Midnight   Stop all vitamins and herbal supplements 7 days before surgery.   Take these medicines the morning of surgery with A SIP OF WATER: Amiodorone, Amlodipine, Atorvastatin, Bupropion, Dicyclomine, Metoprolol, Omeprazole  DO NOT TAKE ANY ORAL DIABETIC MEDICATIONS DAY OF YOUR SURGERY. Hold Glipizide and Metformin the day of surgery.  Bring CPAP mask and tubing day of surgery.                              You may not have any metal on your body including hair pins, jewelry, and body piercing             Do not wear make-up, lotions, powders,  perfumes/cologne, or deodorant  Do not wear nail polish including gel and S&S, artificial/acrylic nails, or any other type of covering on natural nails including finger and toenails. If you have artificial nails, gel coating, etc. that needs to be removed by a nail salon please have this removed prior to surgery or surgery may need to be canceled/ delayed if the surgeon/ anesthesia feels like they are unable to be safely monitored.   Do not shave  48 hours prior to surgery.    Do not bring valuables to the hospital. [place] IS NOT             RESPONSIBLE   FOR VALUABLES.   Contacts, glasses, dentures or bridgework may not be worn into surgery.  DO NOT BRING YOUR HOME MEDICATIONS TO THE HOSPITAL. PHARMACY WILL DISPENSE MEDICATIONS LISTED ON YOUR MEDICATION LIST TO YOU DURING YOUR ADMISSION IN THE HOSPITAL!    Patients discharged on the day of surgery will not be allowed to drive home.  Someone NEEDS to stay with you for the first 24 hours after anesthesia.  Special Instructions: Bring a copy of your healthcare power of attorney and living will documents the day of surgery if you haven't scanned them before.              Please read over the following fact sheets you were given: IF YOU HAVE QUESTIONS ABOUT YOUR PRE-OP INSTRUCTIONS PLEASE CALL [phone] Joan Graves   If you received a COVID test during your pre-op visit  it is requested that you wear a mask when out in public, stay away from anyone that may not be feeling well and notify your surgeon if you develop symptoms. If you test positive for Covid or have been in contact with anyone that has tested positive in the last 10 days please notify you surgeon.    [facility] - Preparing for Surgery Before surgery, you can play an important role.  Because skin is not sterile, your skin needs to be as free of germs as possible.  You can reduce the number of germs on your skin by washing with CHG (chlorahexidine gluconate) soap before surgery.   CHG is an antiseptic cleaner which kills germs and bonds with the skin to continue killing germs even after washing. Please DO NOT use if you have an allergy to CHG or antibacterial soaps.  If your skin becomes reddened/irritated stop using the CHG and inform your nurse when you arrive at Short Stay. Do not shave (including legs and underarms) for at least 48 hours prior to the first CHG shower.  You may shave your face/neck.  Please follow these instructions carefully:  1.  Shower with CHG Soap the night before surgery and the  morning of surgery.  2.  If you choose to wash your hair, wash your hair first as usual with your normal  shampoo.  3.  After you shampoo, rinse your hair and body thoroughly to remove the shampoo.                             4.  Use CHG as you would any other liquid soap.  You can apply chg directly to the skin and wash.  Gently with a scrungie or clean washcloth.  5.  Apply the CHG Soap to your body ONLY FROM THE NECK DOWN.   Do   not use on face/ open                           Wound or open sores. Avoid contact with eyes, ears mouth and   genitals (private parts).                       Wash face,  Genitals (private parts) with your normal soap.             6.  Wash thoroughly, paying special attention to the area where your    surgery  will be performed.  7.  Thoroughly rinse your body with warm water from the neck down.  8.  DO NOT shower/wash with your normal soap after using and rinsing off the CHG Soap.                9.  Pat yourself dry with a clean towel.            10.  Wear clean pajamas.            11.  Place clean sheets  on your bed the night of your first shower and do not  sleep with pets. Day of Surgery : Do not apply any lotions/deodorants the morning of surgery.  Please wear clean clothes to the hospital/surgery center.  FAILURE TO FOLLOW THESE INSTRUCTIONS MAY RESULT IN THE CANCELLATION OF YOUR SURGERY  PATIENT  SIGNATURE_________________________________  NURSE SIGNATURE__________________________________  __How to Manage Your Diabetes Before and After Surgery  Why is it important to control my blood sugar before and after surgery? Improving blood sugar levels before and after surgery helps healing and can limit problems. A way of improving blood sugar control is eating a healthy diet by:  Eating less sugar and carbohydrates  Increasing activity/exercise  Talking with your doctor about reaching your blood sugar goals High blood sugars (greater than 180 mg/dL) can raise your risk of infections and slow your recovery, so you will need to focus on controlling your diabetes during the weeks before surgery. Make sure that the doctor who takes care of your diabetes knows about your planned surgery including the date and location.  How do I manage my blood sugar before surgery? Check your blood sugar at least 4 times a day, starting 2 days before surgery, to make sure that the level is not too high or low. Check your blood sugar the morning of your surgery when you wake up and every 2 hours until you get to the Short Stay unit. If your blood sugar is less than 70 mg/dL, you will need to treat for low blood sugar: Do not take insulin. Treat a low blood sugar (less than 70 mg/dL) with  cup of clear juice (cranberry or apple), 4 glucose tablets, OR glucose gel. Recheck blood sugar in 15 minutes after treatment (to make sure it is greater than 70 mg/dL). If your blood sugar is not greater than 70 mg/dL on recheck, call [phone] for further instructions. Report your blood sugar to the short stay nurse when you get to Short Stay.  If you are admitted to the hospital after surgery: Your blood sugar will be checked by the staff and you will probably be given insulin after surgery (instead of oral diabetes medicines) to make sure you have good blood sugar levels. The goal for blood sugar control after surgery  is 80-180 mg/dL.   WHAT DO I DO ABOUT MY DIABETES MEDICATION?  Do not take oral diabetes medicines (pills) the morning of surgery.Hold Glipizide and Metformin the day of surgery.    HOLD JARDIANCE FOR 72 Hours prior to surgery.  THE NIGHT BEFORE SURGERY, take  only take half of  normal insulin dose If CBG> 220.     DO NOT TAKE THE FOLLOWING 7 DAYS PRIOR TO SURGERY: Ozempic, Wegovy, Rybelsus (Semaglutide), Byetta (exenatide), Bydureon (exenatide ER), Victoza, Saxenda (liraglutide), or Trulicity (dulaglutide) Mounjaro (Tirzepatide) Adlyxin (Lixisenatide), Polyethylene Glycol Loxenatide.  If your CBG is greater than 220 mg/dL, you may take  of your sliding scale  (correction) dose of insulin.    Patient Signature:  Date:   Nurse Signature:  Date:   Reviewed and Endorsed by [facility] Patient Education Committee, August 2015

## 2022-10-06 ENCOUNTER — Other Ambulatory Visit: Payer: Self-pay

## 2022-10-06 ENCOUNTER — Encounter (HOSPITAL_COMMUNITY)
Admission: RE | Admit: 2022-10-06 | Discharge: 2022-10-06 | Disposition: A | Discharge status: Home / Self Care | Payer: 59 | Source: Ambulatory Visit | Attending: Urology | Admitting: Urology

## 2022-10-06 ENCOUNTER — Encounter (HOSPITAL_COMMUNITY): Payer: Self-pay

## 2022-10-06 DIAGNOSIS — I10 Essential (primary) hypertension: Secondary | ICD-10-CM

## 2022-10-06 DIAGNOSIS — Z01818 Encounter for other preprocedural examination: Secondary | ICD-10-CM

## 2022-10-06 DIAGNOSIS — E119 Type 2 diabetes mellitus without complications: Secondary | ICD-10-CM

## 2022-10-06 HISTORY — DX: Unspecified osteoarthritis, unspecified site: M19.90

## 2022-10-06 HISTORY — DX: Cardiac murmur, unspecified: R01.1

## 2022-10-06 NOTE — Progress Notes (Signed)
Called pt. At home to do pre op interview and instructions. Pt. Very slow to answer questions. Not sure if there is a developmental delay. Went over preop instructions as pt. Said she got a paper and pen to take notes. Some of the instructions in depth due to pt's DM2. After phone call completed, a copy of the instructions placed in envelope and taken to mail room to be mailed to pt.

## 2022-10-06 NOTE — Progress Notes (Addendum)
COVID Vaccine received:  []  No [x]  Yes Date of any COVID positive Test in last 90 days: no [facility]CP - [facility] - no  Chest x-ray -  EKG -  08/31/22 Epic Stress Test -  ECHO -  Cardiac Cath -   Bowel Prep - [x]  No  []   Yes ______  Pacemaker / ICD device [x]  No []  Yes   Spinal Cord Stimulator:[x]  No []  Yes       History of Sleep Apnea? []  No [x]  Yes   CPAP used?- []  No [x]  Yes    Does the patient monitor blood sugar?          []  No [x]  Yes  []  N/A  Patient has: []  NO Hx DM   []  Pre-DM                 []  DM1  [x]   DM2 Does patient have a [employer] or Dexacom? [x]  No []  Yes   Fasting Blood Sugar Ranges- 140-200 Checks Blood Sugar ___2__ times a day  GLP1 agonist / usual dose - no GLP1 instructions:  SGLT-2 inhibitors / usual dose - no SGLT-2 instructions:   Blood Thinner / Instructions:will  stop eliquis  on 8/17 Per MD( 2 days prior to surgery) per pt.  Comments:   Activity level: Patient is able to climb a flight of stairs without difficulty; [x]  No CP  [x]  No SOB, but would have ___   Patient can perform ADLs without assistance.   Anesthesia review: DM, HTN, Aortic valve stenosis, A-fib: Cardioversion done June 2024 OSA  Patient denies shortness of breath, fever, cough and chest pain at PAT appointment.  Patient verbalized understanding and agreement to the Pre-Surgical Instructions that were given to them at this PAT appointment. Patient was also educated of the need to review these PAT instructions again prior to his/her surgery.I reviewed the appropriate phone numbers to call if they have any and questions or concerns.

## 2022-10-08 ENCOUNTER — Emergency Department
Admission: EM | Admit: 2022-10-08 | Discharge: 2022-10-08 | Disposition: A | Discharge status: Home / Self Care | Payer: 59 | Attending: Emergency Medicine | Admitting: Emergency Medicine

## 2022-10-08 ENCOUNTER — Encounter: Payer: Self-pay | Admitting: *Deleted

## 2022-10-08 ENCOUNTER — Other Ambulatory Visit: Payer: Self-pay

## 2022-10-08 DIAGNOSIS — E119 Type 2 diabetes mellitus without complications: Secondary | ICD-10-CM | POA: Diagnosis not present

## 2022-10-08 DIAGNOSIS — Z7901 Long term (current) use of anticoagulants: Secondary | ICD-10-CM | POA: Diagnosis not present

## 2022-10-08 DIAGNOSIS — I83891 Varicose veins of right lower extremities with other complications: Secondary | ICD-10-CM | POA: Insufficient documentation

## 2022-10-08 NOTE — ED Triage Notes (Signed)
Per EMS report, patient scrapped off a scab on her RLE and could not control the bleeding. Patient is on Eliquist. EMT reports bleeding is controlled with a pressure dressing applied by EMT and a ice bag. Dr. Fanny Bien at bedside.

## 2022-10-08 NOTE — ED Notes (Signed)
Two pinpoint areas have been covered with glue by Dr. Fanny Bien. No bleeding noted. Patient tolerated procedure well.

## 2022-10-08 NOTE — ED Notes (Signed)
Dr. Fanny Bien applied glue over two pinpoint sites that had bleeding controlled upon arrival. No bleeding noted.

## 2022-10-08 NOTE — ED Provider Notes (Addendum)
[facility] Provider Note    Event Date/Time   First MD Initiated Contact with Patient 10/08/22 1137     (approximate)   History   Extremity Laceration   HPI  Joan Graves is a 52 y.o. female who was celebrating her birthday today, has a history of pulmonary hypertension diabetes, blood clots, and is currently on Eliquis  Patient reports that today she took a shower, one of the small scabs on her right lower leg broke open and she could not get the bleeding to stop.  Her blood moderately in the shower, she will use to slow down but then I would just never stop it just kept losing.  She called EMS because of ongoing issue getting the bleeding to stop.  No fevers no chills otherwise in her normal health.  She was planning to do laundry today and celebrate her birthday       Physical Exam   Triage Vital Signs: ED Triage Vitals [10/08/22 1136]  Encounter Vitals Group     BP (!) 121/105     Systolic BP Percentile      Diastolic BP Percentile      Pulse Rate 69     Resp 18     Temp 98 F (36.7 C)     Temp Source Oral     SpO2 100 %     Weight      Height      Head Circumference      Peak Flow      Pain Score      Pain Loc      Pain Education      Exclude from Growth Chart     Most recent vital signs: Vitals:   10/08/22 1136 10/08/22 1140  BP: (!) 121/105 134/67  Pulse: 69   Resp: 18   Temp: 98 F (36.7 C)   SpO2: 100%      General: Awake, no distress.  CV:  Good peripheral perfusion.  Resp:  Normal effort.  Abd:  No distention.  Other:  Strong right lower extremity dorsalis pedis posterior tibial pulses.  Small amount of dried blood has been pulled on by gravity over the right lower leg.  Leg atraumatic except for 2 small punctate areas that appear to be small scabs overlying likely small varicose veins.  Patient reports history of similar.  There is no active bleeding at this time after EMS bandaging. Mild changes of  venous stasis to lower extremities bilaterally.  No unilateral edema.  No erythema or warmth.  No fluctuance mass or evidence of abscess.  No cellulitic changes  ED Results / Procedures / Treatments   Labs (all labs ordered are listed, but only abnormal results are displayed) Labs Reviewed - No data to display   EKG     RADIOLOGY     PROCEDURES:  Critical Care performed: No  ..Laceration Repair  Date/Time: 10/08/2022 11:39 AM  Performed by: Sharyn Creamer, MD Authorized by: Sharyn Creamer, MD   Consent:    Consent obtained:  Verbal   Consent given by:  Patient   Alternatives discussed:  No treatment Universal protocol:    Patient identity confirmed:  Verbally with patient Laceration details:    Location:  Leg   Leg location:  R lower leg   Length (cm):  0.2   Depth (mm):  1 Skin repair:    Repair method:  Tissue adhesive Approximation:    Approximation:  Close Repair type:  Repair type:  Simple Comments:     Applied 1 to 2 drops of Dermabond over each small area of punctate now scabbed over varicosity.  Patient no longer has active bleeding, but feel that Dermabond may help to alleviate risk for rebleeding.    MEDICATIONS ORDERED IN ED: Medications - No data to display   IMPRESSION / MDM / ASSESSMENT AND PLAN / ED COURSE  I reviewed the triage vital signs and the nursing notes.                              Differential diagnosis includes, but is not limited to, bleeding abrasion, pimple, varicose vein, etc.  No evidence of complication.  Patient actually has had bleeding stopped after EMS applied pressure.  No evidence of major bleeding.  She is awake alert well-oriented.  Vascularly intact in right lower extremity.  Small amount of Dermabond placed over each of 2 punctate areas that represent I suspect likely small varicosities that bled.  No further bleeding.  Discussed careful return precautions also.  Bleeding care and treatment with the patient, and I did  provide her gauze dressing that she can take home with her in the event she has further bleeding she can trial pressure, direct pressure, and utilize EMS if needed.  She is very agreeable with the plan.  Patient's presentation is most consistent with acute, uncomplicated illness.     Return precautions and treatment recommendations and follow-up discussed with the patient who is agreeable with the plan.      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   Sharyn Creamer, MD 10/08/22 1142    Sharyn Creamer, MD 10/08/22 1143

## 2022-10-11 NOTE — Progress Notes (Signed)
Anesthesia Chart Review   Case: [id number] Date/Time: 10/18/22 0915   Procedures:      Leane Platt IMPLANT SECOND STAGE - NEEDS TO BE INTUBATED WITH NO MUSCLE RELAXANT   NEEDS 75 MINS FOR THIS CASE     INTERSTIM IMPLANT FIRST STAGE   Anesthesia type: Monitor Anesthesia Care   Pre-op diagnosis: REFRACTORY URGENCY INCONTINENCE   Location: [facility] ROOM 04 / [facility]   Surgeons: Alfredo Martinez, MD       DISCUSSION:52 y.o. former smoker with h/o HTN, sleep apnea on CPAP, DM II, PAF s/p DCCV, mild AV stenosis, refractory urgency incontinence scheduled for above procedure 10/18/2022 with Dr. Alfredo Martinez.   S/p cardioversion 08/24/2022. Last seen by cardiology 10/05/2022, stable at this visit.  VS: LMP 10/06/2017 (Approximate) Comment: Pt did not have period for approx 4 years then started periods again this spring, irregular. neg preg test  PROVIDERS: Center, [facility], Jorja Loa, MD is Cardiologist  LABS: Labs reviewed: Acceptable for surgery. (all labs ordered are listed, but only abnormal results are displayed)  Labs Reviewed - No data to display   IMAGES:   EKG:   CV: Echo 12/07/2021 NTERPRETATION  NORMAL LEFT VENTRICULAR SYSTOLIC FUNCTION   WITH MILD LVH  NORMAL RIGHT VENTRICULAR SYSTOLIC FUNCTION  TRIVIAL REGURGITATION NOTED (See above)  MILD VALVULAR STENOSIS (See above)  IRREGULAR HEART RHYTHM CAPTURED THROUGHOUT EXAM  ESTIMATED LVEF >55%  Aortic: TRIVIAL AI; MILD AS  AS: MAX VEL. 2.10m/s (*prev. exam 3.73m/s)  AVA: 1.1cm^2 (prev. exam 1.7cm^2)  Mitral: TRIVIAL MR *SIGNIFICANT MAC  Tricuspid: TRIVIAL TR  Pulmonic: TRIVIAL PI  MODERATE LAE   Myocardial Perfusion 01/06/2021     Findings are consistent with no prior ischemia. The study is low risk.   No ST deviation was noted.   LV perfusion is equivocal. There is no evidence of ischemia.   Left ventricular function is normal. Nuclear stress EF: 55 %. The left ventricular ejection  fraction is normal (55-65%). End diastolic cavity size is normal. End systolic cavity size is normal.   Small fixed apical defect most consistent with apical thinning, no evidence for ischemia.  Echo 12/15/2020 1. Left ventricular ejection fraction, by estimation, is 55 to 60%. The  left ventricle has normal function. The left ventricle has no regional  wall motion abnormalities. Left ventricular diastolic parameters were  normal.   2. Right ventricular systolic function is normal. The right ventricular  size is normal.   3. Left atrial size was mildly dilated.   4. Right atrial size was mildly dilated.   5. The mitral valve is normal in structure. Mild mitral valve  regurgitation.   6. The aortic valve is normal in structure. Aortic valve regurgitation is  trivial. Mild aortic valve stenosis.  Past Medical History:  Diagnosis Date   Anemia    Anxiety    Aortic valve stenosis    Arthritis    Asthma    Bradycardia    Cancer (HCC)    endometrial cancer   Chronic pulmonary hypertension (HCC)    Depression    Diabetes mellitus without complication (HCC)    Dysrhythmia    Endometrial cancer (HCC)    GERD (gastroesophageal reflux disease)    Heart murmur    History of radiation therapy    Endometrial- HDR- 06/13/22-07/11/22- Dr. Antony Blackbird   Hypercholesteremia    Hypertension    Paroxysmal atrial fibrillation (HCC)    Paroxysmal atrial fibrillation (HCC)    Sleep apnea  Past Surgical History:  Procedure Laterality Date   CARDIOVERSION N/A 12/30/2020   Procedure: CARDIOVERSION;  Surgeon: Lamar Blinks, MD;  Location: [facility] ORS;  Service: Cardiovascular;  Laterality: N/A;   CARDIOVERSION N/A 08/24/2022   Procedure: CARDIOVERSION;  Surgeon: Alwyn Pea, MD;  Location: [facility] ORS;  Service: Cardiovascular;  Laterality: N/A;   CATARACT EXTRACTION Right 11/2021   CATARACT EXTRACTION Left 12/2021   COLONOSCOPY     COLONOSCOPY WITH PROPOFOL N/A 08/18/2022    Procedure: COLONOSCOPY WITH PROPOFOL;  Surgeon: Jaynie Collins, DO;  Location: [facility] ENDOSCOPY;  Service: Gastroenterology;  Laterality: N/A;  IDDM   ESOPHAGOGASTRODUODENOSCOPY (EGD) WITH PROPOFOL N/A 06/29/2021   Procedure: ESOPHAGOGASTRODUODENOSCOPY (EGD) WITH PROPOFOL;  Surgeon: Midge Minium, MD;  Location: [facility] ENDOSCOPY;  Service: Endoscopy;  Laterality: N/A;   POLYPECTOMY  08/18/2022   Procedure: POLYPECTOMY;  Surgeon: Jaynie Collins, DO;  Location: [facility] ENDOSCOPY;  Service: Gastroenterology;;   ROBOTIC ASSISTED TOTAL HYSTERECTOMY WITH BILATERAL SALPINGO OOPHERECTOMY Bilateral 04/12/2022   Procedure: XI ROBOTIC ASSISTED TOTAL HYSTERECTOMY WITH BILATERAL SALPINGO OOPHORECTOMY;  Surgeon: Clide Cliff, MD;  Location: [facility];  Service: Gynecology;  Laterality: Bilateral;   SENTINEL NODE BIOPSY N/A 04/12/2022   Procedure: SENTINEL NODE INJECTION;  Surgeon: Clide Cliff, MD;  Location: [facility];  Service: Gynecology;  Laterality: N/A;   TUBAL LIGATION  2000    MEDICATIONS:  ACCU-CHEK SOFTCLIX LANCETS lancets   ADVAIR DISKUS 500-50 MCG/DOSE AEPB   amiodarone (PACERONE) 200 MG tablet   amLODipine (NORVASC) 10 MG tablet   apixaban (ELIQUIS) 5 MG TABS tablet   atorvastatin (LIPITOR) 20 MG tablet   buPROPion (WELLBUTRIN SR) 100 MG 12 hr tablet   enalapril (VASOTEC) 20 MG tablet   ferrous sulfate 325 (65 FE) MG EC tablet   furosemide (LASIX) 20 MG tablet   glipiZIDE (GLUCOTROL XL) 10 MG 24 hr tablet   glucose blood test strip   hydrochlorothiazide (MICROZIDE) 12.5 MG capsule   Insulin Glargine (BASAGLAR KWIKPEN) 100 UNIT/ML   JARDIANCE 25 MG TABS tablet   loratadine (CLARITIN) 10 MG tablet   metFORMIN (GLUCOPHAGE-XR) 500 MG 24 hr tablet   metoprolol tartrate (LOPRESSOR) 25 MG tablet   naproxen sodium (ALEVE) 220 MG tablet   omeprazole (PRILOSEC) 40 MG capsule   No current facility-administered medications for this encounter.    Jodell Cipro Ward, PA-C [facility]  Pre-Surgical Testing [phone]

## 2022-10-14 NOTE — H&P (Signed)
T: Leaks without awareness.  Has had urodynamic and failed Myrbetriq.  Had a residual of 196 mL after the study but a normal residual prior.  Was given Flomax.  Failed oxybutynin  Today Patient has small-volume urge incontinence.  She says she leaks when she is in a chair.  I could not tell if it was leakage well awareness in a chair or it occurs when she sits.  History was challenging.  No leaking with coughing sneezing.  Has moderately severe bedwetting.  Wears more than 5 pads a day moderately wet  She voids every 60 to 90 minutes.  She gets up 5 times a night.  She has ankle edema.  Flow is reasonable  She is an insulin-dependent diabetic.  No hysterectomy   On urodynamics she did not void and was catheterized for a few milliliters.  Maximum bladder capacity was 440 mL.  Bladder was stable.  She had no stress incontinence generating a Valsalva pressure 103 cm water.  During voluntary voiding she voided 244 mL with maximal flow 12 mils per second.  Max voiding pressure 37 cm of water.  Residual was 196 mL.  Bladder neck descended less than 1 cm.  She did a lot of straining to void and it was prolonged interrupted pattern.   Based upon the presentation appears that 90% of the patient's problem or greater is refractory overactive bladder.  If she does have stress incontinence it would be mild and does not explain leaking when she sits in a chair.  Treatments for stress incontinence would be challenging with body habitus.  Call if culture positive.  Reassess for pelvic examination cystoscopy on Gemtesa in 5 to 6 weeks.  Bedwetting due to overactive bladder.  No recent urine culture.  Body habitus may also make some of the refractory OAB therapies challenging    Last urine was positive for low count of Streptococcus And positive today for blood and bacteria. Patient failed Gemtesa Patient is on Eliquis and used vape in the past but no aspirin   Cystoscopy: Patient underwent flexible cystoscopy.   Urethra was very tender but overall she did very well tolerating the procedure.  Bladder mucosa and trigone were normal.  No carcinoma.  No cystitis.     Based on body habitus refractory treatments are to be difficult.  She would not be able to catheterize and certainly we would not be able to do Botox in the office due to tenderness.  She would also need to stop her blood thinners.  Recognizing limitations I gave her Vesicare 5 mg 3x11 and reassess in 6 weeks.  I will have her stand and assess for possible InterStim.  Body habitus may be an issue.  I will order CT scan because of microscopic hematuria and call if abnormal.   Upon further discussion I had the patient's stand and she has a lot of distance in her lower back.  I went over percutaneous tibial nerve stimulation in detail and she would not have to stop her blood thinner.  She want to hear about InterStim and I went through it in detail with full template and she would have to be off the blood thinner for the test done in [place] and surgery done here in [place].  We would need medical clearance for both from her doctor.  She is on Eliquis for atrial fibrillation diagnosed last year.  She understands that I would not be doing this with a bridge.  I urged her to try  the ankle treatments and Vesicare but she did not want to try another medication and certainly the chances of it working is much lower   I do not think she should have Botox based on body habitus and Eliquis and atrial fibrillation.  We spent a long time discussing the options.  Patient shows percutaneous tibial nerve stimulation.  She should be encouraged to try the 12 weeks since again limitations are limited and more challenging     Today Patient recently had a hysterectomy for endometrial cancer.  The respond to percutaneous tibial nerve stimulation.  She reported having cervical cancer.  She did well.  She stopped her Eliquis for 2 days prior and started it 1 day after  surgery.   She still leaks without awareness with urge incontinence and frequency and would really like to have the sacral neuromodulation.  She has not done percutaneous tibial nerve stimulation for about 2 months while she was healing.   I examined her today especially her lower back and she understands that it could be challenging to place the leads and or InterStim device but I think it is possible with a longer 5 inch needles.  We talked about stopping and starting blood thinners before and after each procedure both the test stimulation implant eating medical clearance     PMH:     Past Medical History:  Diagnosis Date   Anemia     Anxiety     Aortic valve stenosis     Asthma     Cancer (HCC)      endometrial cancer   Chronic pulmonary hypertension (HCC)     Depression     Diabetes mellitus without complication (HCC)     GERD (gastroesophageal reflux disease)     Hypercholesteremia     Hypertension     Paroxysmal atrial fibrillation (HCC)     Sleep apnea            Surgical History:      Past Surgical History:  Procedure Laterality Date   CARDIOVERSION N/A 12/30/2020    Procedure: CARDIOVERSION;  Surgeon: Lamar Blinks, MD;  Location: [facility] ORS;  Service: Cardiovascular;  Laterality: N/A;   CATARACT EXTRACTION Right 11/2021   CATARACT EXTRACTION Left 12/2021   COLONOSCOPY       ESOPHAGOGASTRODUODENOSCOPY (EGD) WITH PROPOFOL N/A 06/29/2021    Procedure: ESOPHAGOGASTRODUODENOSCOPY (EGD) WITH PROPOFOL;  Surgeon: Midge Minium, MD;  Location: [facility] END[facility]COPY;  Service: Endoscopy;  Laterality: N/A;   ROBOTIC ASSISTED TOTAL HYSTERECTOMY WITH BILATERAL SALPINGO OOPHERECTOMY Bilateral 04/12/2022    Procedure: XI ROBOTIC ASSISTED TOTAL HYSTERECTOMY WITH BILATERAL SALPINGO OOPHORECTOMY;  Surgeon: Clide Cliff, MD;  Location: [facility];  Service: Gynecology;  Laterality: Bilateral;   SENTINEL NODE BIOPSY N/A 04/12/2022    Procedure: SENTINEL NODE INJECTION;  Surgeon: Clide Cliff, MD;  Location: [facility] OR[facility];  Service: Gynecology;  Laterality: N/A;   TUBAL LIGATION   2000          Home Medications:  Allergies as of 07/18/2022         Reactions    Aspirin Diarrhea    "high doses" gives her diarrhea            Medication List           Accurate as of Jul 18, 2022  9:07 AM. If you have any questions, ask your nurse or doctor.              Accu-Chek Softclix Lancets lancets CHECK GLUCOSE  TWICE DIALY FOR GLUCOSE MONITORING E11.9    Advair Diskus 500-50 MCG/ACT Aepb Generic drug: fluticasone-salmeterol Inhale 1 puff into the lungs in the morning and at bedtime.    amLODipine 10 MG tablet Commonly known as: NORVASC Take 1 tablet by mouth daily.    atorvastatin 20 MG tablet Commonly known as: LIPITOR Take 20 mg by mouth daily.    Basaglar KwikPen 100 UNIT/ML Inject 20 Units into the skin at bedtime as needed (if CBG over 200).    buPROPion ER 100 MG 12 hr tablet Commonly known as: WELLBUTRIN SR Take 100 mg by mouth 2 (two) times daily.    cefdinir 300 MG capsule Commonly known as: OMNICEF Take 1 capsule (300 mg total) by mouth 2 (two) times daily.    dicyclomine 20 MG tablet Commonly known as: BENTYL Take 20 mg by mouth 2 (two) times daily.    Eliquis 5 MG Tabs tablet Generic drug: apixaban Take 5 mg by mouth 2 (two) times daily.    enalapril 20 MG tablet Commonly known as: VASOTEC Take 20 mg by mouth daily.    ferrous sulfate 325 (65 FE) MG EC tablet Take 650 mg by mouth daily with breakfast.    fluconazole 150 MG tablet Commonly known as: Diflucan Take 1 tablet (150 mg total) by mouth daily. Take second tablet 72 hours later    glipiZIDE 10 MG 24 hr tablet Commonly known as: GLUCOTROL XL Take 10 mg by mouth daily.    glucose blood test strip USE AS DIRECTED TWICE A DAY    Jardiance 25 MG Tabs tablet Generic drug: empagliflozin Take 25 mg by mouth daily.    metFORMIN 500 MG 24 hr tablet Commonly known as:  GLUCOPHAGE-XR Take 1,000 mg by mouth 2 (two) times daily with a meal.    metoprolol tartrate 25 MG tablet Commonly known as: LOPRESSOR Take 12.5 mg by mouth 3 (three) times daily.    omeprazole 40 MG capsule Commonly known as: PRILOSEC Take 40 mg by mouth daily.             Allergies:  Allergies       Allergies  Allergen Reactions   Aspirin Diarrhea      "high doses" gives her diarrhea        Family History:      Family History  Problem Relation Age of Onset   Ovarian cancer Mother          mid 52s   Lung cancer Mother 19   Throat cancer Maternal Grandmother     Colon cancer Neg Hx     Breast cancer Neg Hx     Endometrial cancer Neg Hx     Pancreatic cancer Neg Hx     Prostate cancer Neg Hx            Social History:  reports that she has quit smoking. Her smoking use included cigarettes. She has been exposed to tobacco smoke. She has never used smokeless tobacco. She reports that she does not currently use alcohol. She reports that she does not use drugs.   ROS:                           Physical Exam: BP 112/82   Pulse 75   Ht 5\' 4"  (1.626 m)   Wt (!) 146.1 kg   LMP 10/06/2017 (Approximate) Comment: Pt did not have period for approx 4 years then started periods again  this spring, irregular. neg preg test  BMI 55.27 kg/m   Constitutional:  Alert and oriented, No acute distress. HEENT: [place] AT, moist mucus membranes.  Trachea midline, no masses.     Laboratory Data: Recent Labs       Lab Results  Component Value Date    WBC 10.5 05/30/2022    HGB 14.8 05/30/2022    HCT 48.0 (H) 05/30/2022    MCV 87.6 05/30/2022    PLT 277 05/30/2022        Recent Labs       Lab Results  Component Value Date    CREATININE 0.74 05/30/2022        Recent Labs  No results found for: "PSA"     Recent Labs  No results found for: "TESTOSTERONE"     Recent Labs       Lab Results  Component Value Date    HGBA1C 6.7 (H) 03/02/2022         Urinalysis Labs (Brief)           Component Value Date/Time    COLORURINE RED (A) 05/30/2022 2125    APPEARANCEUR TURBID (A) 05/30/2022 2125    APPEARANCEUR Clear 11/15/2021 1450    LABSPEC 1.039 (H) 05/30/2022 2125    PHURINE   05/30/2022 2125      TEST NOT REPORTED DUE TO COLOR INTERFERENCE OF URINE PIGMENT    GLUCOSEU (A) 05/30/2022 2125      TEST NOT REPORTED DUE TO COLOR INTERFERENCE OF URINE PIGMENT    HGBUR (A) 05/30/2022 2125      TEST NOT REPORTED DUE TO COLOR INTERFERENCE OF URINE PIGMENT    BILIRUBINUR (A) 05/30/2022 2125      TEST NOT REPORTED DUE TO COLOR INTERFERENCE OF URINE PIGMENT    BILIRUBINUR Negative 11/15/2021 1450    KETONESUR (A) 05/30/2022 2125      TEST NOT REPORTED DUE TO COLOR INTERFERENCE OF URINE PIGMENT    PROTEINUR (A) 05/30/2022 2125      TEST NOT REPORTED DUE TO COLOR INTERFERENCE OF URINE PIGMENT    NITRITE (A) 05/30/2022 2125      TEST NOT REPORTED DUE TO COLOR INTERFERENCE OF URINE PIGMENT    LEUKOCYTESUR (A) 05/30/2022 2125      TEST NOT REPORTED DUE TO COLOR INTERFERENCE OF URINE PIGMENT        Pertinent Imaging:     Assessment & Plan: Send urine for culture.  Schedule peripheral nerve evaluation.  InterStim could then be done here locally

## 2022-10-18 ENCOUNTER — Encounter (HOSPITAL_COMMUNITY)
Admission: RE | Disposition: A | Discharge status: Home / Self Care | Payer: Self-pay | Source: Ambulatory Visit | Attending: Urology

## 2022-10-18 ENCOUNTER — Ambulatory Visit (HOSPITAL_COMMUNITY): Payer: 59 | Admitting: Physician Assistant

## 2022-10-18 ENCOUNTER — Encounter (HOSPITAL_COMMUNITY): Payer: Self-pay | Admitting: Urology

## 2022-10-18 ENCOUNTER — Ambulatory Visit (HOSPITAL_COMMUNITY)
Admission: RE | Admit: 2022-10-18 | Discharge: 2022-10-18 | Disposition: A | Discharge status: Home / Self Care | Payer: 59 | Source: Ambulatory Visit | Attending: Urology | Admitting: Urology

## 2022-10-18 ENCOUNTER — Ambulatory Visit (HOSPITAL_COMMUNITY): Payer: 59

## 2022-10-18 ENCOUNTER — Other Ambulatory Visit: Payer: Self-pay

## 2022-10-18 ENCOUNTER — Ambulatory Visit (HOSPITAL_BASED_OUTPATIENT_CLINIC_OR_DEPARTMENT_OTHER): Payer: 59

## 2022-10-18 DIAGNOSIS — Z87891 Personal history of nicotine dependence: Secondary | ICD-10-CM

## 2022-10-18 DIAGNOSIS — I272 Pulmonary hypertension, unspecified: Secondary | ICD-10-CM | POA: Diagnosis not present

## 2022-10-18 DIAGNOSIS — Z7951 Long term (current) use of inhaled steroids: Secondary | ICD-10-CM | POA: Insufficient documentation

## 2022-10-18 DIAGNOSIS — I10 Essential (primary) hypertension: Secondary | ICD-10-CM | POA: Insufficient documentation

## 2022-10-18 DIAGNOSIS — J45909 Unspecified asthma, uncomplicated: Secondary | ICD-10-CM

## 2022-10-18 DIAGNOSIS — N3941 Urge incontinence: Secondary | ICD-10-CM | POA: Diagnosis not present

## 2022-10-18 DIAGNOSIS — K219 Gastro-esophageal reflux disease without esophagitis: Secondary | ICD-10-CM | POA: Insufficient documentation

## 2022-10-18 DIAGNOSIS — Z6841 Body Mass Index (BMI) 40.0 and over, adult: Secondary | ICD-10-CM | POA: Insufficient documentation

## 2022-10-18 DIAGNOSIS — Z8542 Personal history of malignant neoplasm of other parts of uterus: Secondary | ICD-10-CM | POA: Diagnosis not present

## 2022-10-18 DIAGNOSIS — E119 Type 2 diabetes mellitus without complications: Secondary | ICD-10-CM | POA: Diagnosis not present

## 2022-10-18 DIAGNOSIS — Z01818 Encounter for other preprocedural examination: Secondary | ICD-10-CM

## 2022-10-18 DIAGNOSIS — Z7984 Long term (current) use of oral hypoglycemic drugs: Secondary | ICD-10-CM | POA: Diagnosis not present

## 2022-10-18 DIAGNOSIS — I480 Paroxysmal atrial fibrillation: Secondary | ICD-10-CM | POA: Diagnosis not present

## 2022-10-18 DIAGNOSIS — G4730 Sleep apnea, unspecified: Secondary | ICD-10-CM | POA: Insufficient documentation

## 2022-10-18 DIAGNOSIS — Z7901 Long term (current) use of anticoagulants: Secondary | ICD-10-CM | POA: Insufficient documentation

## 2022-10-18 DIAGNOSIS — Z794 Long term (current) use of insulin: Secondary | ICD-10-CM

## 2022-10-18 HISTORY — PX: INTERSTIM IMPLANT PLACEMENT: SHX5130

## 2022-10-18 LAB — BASIC METABOLIC PANEL
Anion gap: 14 (ref 5–15)
BUN: 18 mg/dL (ref 6–20)
CO2: 23 mmol/L (ref 22–32)
Calcium: 8.5 mg/dL — ABNORMAL LOW (ref 8.9–10.3)
Chloride: 99 mmol/L (ref 98–111)
Creatinine, Ser: 1.08 mg/dL — ABNORMAL HIGH (ref 0.44–1.00)
GFR, Estimated: >60 mL/min (ref >60)
Glucose, Bld: 120 mg/dL — ABNORMAL HIGH (ref 70–99)
Potassium: 3.7 mmol/L (ref 3.5–5.1)
Sodium: 136 mmol/L (ref 135–145)

## 2022-10-18 LAB — CBC
HCT: 38.1 % (ref 36.0–46.0)
Hemoglobin: 11.6 g/dL — ABNORMAL LOW (ref 12.0–15.0)
MCH: 26.9 pg (ref 26.0–34.0)
MCHC: 30.4 g/dL (ref 30.0–36.0)
MCV: 88.2 fL (ref 80.0–100.0)
Platelets: 190 10*3/uL (ref 150–400)
RBC: 4.32 MIL/uL (ref 3.87–5.11)
RDW: 15.6 % — ABNORMAL HIGH (ref 11.5–15.5)
WBC: 6.4 10*3/uL (ref 4.0–10.5)
nRBC: 0 % (ref 0.0–0.2)

## 2022-10-18 LAB — HEMOGLOBIN A1C
Hgb A1c MFr Bld: 8 % — ABNORMAL HIGH (ref 4.8–5.6)
Mean Plasma Glucose: 182.9 mg/dL

## 2022-10-18 LAB — SURGICAL PCR SCREEN
MRSA, PCR: POSITIVE — AB
Staphylococcus aureus: POSITIVE — AB

## 2022-10-18 LAB — GLUCOSE, CAPILLARY
Glucose-Capillary: 136 mg/dL — ABNORMAL HIGH (ref 70–99)
Glucose-Capillary: 156 mg/dL — ABNORMAL HIGH (ref 70–99)

## 2022-10-18 SURGERY — INSERTION, SACRAL NERVE STIMULATOR, INTERSTIM, STAGE 2
Anesthesia: General

## 2022-10-18 MED ORDER — LIDOCAINE HCL (PF) 2 % IJ SOLN
INTRAMUSCULAR | Status: AC
  Filled 2022-10-18: qty 5

## 2022-10-18 MED ORDER — OXYCODONE HCL 5 MG/5ML PO SOLN: 5.0000 mg | Freq: Once | ORAL | Status: AC | PRN

## 2022-10-18 MED ORDER — LIDOCAINE HCL (CARDIAC) PF 100 MG/5ML IV SOSY
PREFILLED_SYRINGE | INTRAVENOUS | Status: DC | PRN
  Administered 2022-10-18: 60 mg via INTRAVENOUS

## 2022-10-18 MED ORDER — GENTAMICIN SULFATE 40 MG/ML IJ SOLN
5.0000 mg/kg | INTRAVENOUS | Status: AC
  Administered 2022-10-18: 450 mg via INTRAVENOUS
  Filled 2022-10-18: qty 11.25

## 2022-10-18 MED ORDER — MIDAZOLAM HCL 2 MG/2ML IJ SOLN
INTRAMUSCULAR | Status: AC
  Filled 2022-10-18: qty 2

## 2022-10-18 MED ORDER — LIDOCAINE-EPINEPHRINE (PF) 1 %-1:200000 IJ SOLN
INTRAMUSCULAR | Status: DC | PRN
  Administered 2022-10-18: 9 mL

## 2022-10-18 MED ORDER — GLYCOPYRROLATE 0.2 MG/ML IJ SOLN
INTRAMUSCULAR | Status: DC | PRN
  Administered 2022-10-18 (×2): .1 mg via INTRAVENOUS

## 2022-10-18 MED ORDER — SUCCINYLCHOLINE CHLORIDE 200 MG/10ML IV SOSY
PREFILLED_SYRINGE | INTRAVENOUS | Status: DC | PRN
Start: 2022-10-18 — End: 2022-10-18
  Administered 2022-10-18: 120 mg via INTRAVENOUS

## 2022-10-18 MED ORDER — INSULIN ASPART 100 UNIT/ML IJ SOLN: 0.0000 [IU] | INTRAMUSCULAR | Status: DC | PRN

## 2022-10-18 MED ORDER — STERILE WATER FOR IRRIGATION IR SOLN
Status: DC | PRN
Start: 2022-10-18 — End: 2022-10-18
  Administered 2022-10-18: 500 mL

## 2022-10-18 MED ORDER — ORAL CARE MOUTH RINSE: 15.0000 mL | Freq: Once | OROMUCOSAL | Status: AC

## 2022-10-18 MED ORDER — PROPOFOL 10 MG/ML IV BOLUS
INTRAVENOUS | Status: DC | PRN
  Administered 2022-10-18: 70 mg via INTRAVENOUS

## 2022-10-18 MED ORDER — CHLORHEXIDINE GLUCONATE 0.12 % MT SOLN
15.0000 mL | Freq: Once | OROMUCOSAL | Status: AC
  Administered 2022-10-18: 15 mL via OROMUCOSAL

## 2022-10-18 MED ORDER — LIDOCAINE HCL (PF) 1 % IJ SOLN
INTRAMUSCULAR | Status: AC
  Filled 2022-10-18: qty 30

## 2022-10-18 MED ORDER — ROCURONIUM BROMIDE 10 MG/ML (PF) SYRINGE
PREFILLED_SYRINGE | INTRAVENOUS | Status: AC
  Filled 2022-10-18: qty 10

## 2022-10-18 MED ORDER — PHENYLEPHRINE HCL (PRESSORS) 10 MG/ML IV SOLN
INTRAVENOUS | Status: DC | PRN
  Administered 2022-10-18: 160 ug via INTRAVENOUS
  Administered 2022-10-18: 80 ug via INTRAVENOUS
  Administered 2022-10-18: 160 ug via INTRAVENOUS
  Administered 2022-10-18: 80 ug via INTRAVENOUS

## 2022-10-18 MED ORDER — OXYCODONE HCL 5 MG PO TABS
ORAL_TABLET | ORAL | Status: AC
  Filled 2022-10-18: qty 1

## 2022-10-18 MED ORDER — FENTANYL CITRATE PF 50 MCG/ML IJ SOSY: 25.0000 ug | PREFILLED_SYRINGE | INTRAMUSCULAR | Status: DC | PRN

## 2022-10-18 MED ORDER — ROCURONIUM BROMIDE 100 MG/10ML IV SOLN
INTRAVENOUS | Status: DC | PRN
  Administered 2022-10-18: 10 mg via INTRAVENOUS

## 2022-10-18 MED ORDER — ONDANSETRON HCL 4 MG/2ML IJ SOLN
INTRAMUSCULAR | Status: DC | PRN
  Administered 2022-10-18: 4 mg via INTRAVENOUS

## 2022-10-18 MED ORDER — EPHEDRINE 5 MG/ML INJ
INTRAVENOUS | Status: AC
  Filled 2022-10-18: qty 5

## 2022-10-18 MED ORDER — PHENYLEPHRINE 80 MCG/ML (10ML) SYRINGE FOR IV PUSH (FOR BLOOD PRESSURE SUPPORT)
PREFILLED_SYRINGE | INTRAVENOUS | Status: AC
  Filled 2022-10-18: qty 10

## 2022-10-18 MED ORDER — BUPIVACAINE-EPINEPHRINE 0.5% -1:200000 IJ SOLN
INTRAMUSCULAR | Status: DC | PRN
  Administered 2022-10-18: 9 mL

## 2022-10-18 MED ORDER — LACTATED RINGERS IV SOLN: INTRAVENOUS | Status: DC

## 2022-10-18 MED ORDER — EPHEDRINE SULFATE (PRESSORS) 50 MG/ML IJ SOLN
INTRAMUSCULAR | Status: DC | PRN
  Administered 2022-10-18: 10 mg via INTRAVENOUS
  Administered 2022-10-18: 5 mg via INTRAVENOUS
  Administered 2022-10-18: 10 mg via INTRAVENOUS

## 2022-10-18 MED ORDER — OXYCODONE HCL 5 MG PO TABS
5.0000 mg | ORAL_TABLET | Freq: Once | ORAL | Status: AC | PRN
  Administered 2022-10-18: 5 mg via ORAL

## 2022-10-18 MED ORDER — SUCCINYLCHOLINE CHLORIDE 200 MG/10ML IV SOSY
PREFILLED_SYRINGE | INTRAVENOUS | Status: AC
  Filled 2022-10-18: qty 10

## 2022-10-18 MED ORDER — FENTANYL CITRATE (PF) 100 MCG/2ML IJ SOLN
INTRAMUSCULAR | Status: AC
  Filled 2022-10-18: qty 2

## 2022-10-18 MED ORDER — DEXAMETHASONE SODIUM PHOSPHATE 10 MG/ML IJ SOLN
INTRAMUSCULAR | Status: AC
  Filled 2022-10-18: qty 1

## 2022-10-18 MED ORDER — ONDANSETRON HCL 4 MG/2ML IJ SOLN: 4.0000 mg | Freq: Four times a day (QID) | INTRAMUSCULAR | Status: DC | PRN

## 2022-10-18 MED ORDER — MIDAZOLAM HCL 5 MG/5ML IJ SOLN
INTRAMUSCULAR | Status: DC | PRN
  Administered 2022-10-18: 2 mg via INTRAVENOUS

## 2022-10-18 MED ORDER — GLYCOPYRROLATE 0.2 MG/ML IJ SOLN
INTRAMUSCULAR | Status: AC
  Filled 2022-10-18: qty 1

## 2022-10-18 MED ORDER — PROPOFOL 10 MG/ML IV BOLUS
INTRAVENOUS | Status: AC
  Filled 2022-10-18: qty 20

## 2022-10-18 MED ORDER — BUPIVACAINE-EPINEPHRINE (PF) 0.5% -1:200000 IJ SOLN
INTRAMUSCULAR | Status: AC
  Filled 2022-10-18: qty 30

## 2022-10-18 MED ORDER — ONDANSETRON HCL 4 MG/2ML IJ SOLN
INTRAMUSCULAR | Status: AC
  Filled 2022-10-18: qty 2

## 2022-10-18 MED ORDER — SULFAMETHOXAZOLE-TRIMETHOPRIM 800-160 MG PO TABS: 1.0000 | ORAL_TABLET | Freq: Two times a day (BID) | ORAL | 0 refills | Status: DC

## 2022-10-18 MED ORDER — CIPROFLOXACIN IN D5W 400 MG/200ML IV SOLN
400.0000 mg | INTRAVENOUS | Status: AC
  Administered 2022-10-18: 400 mg via INTRAVENOUS
  Filled 2022-10-18: qty 200

## 2022-10-18 MED ORDER — FENTANYL CITRATE (PF) 100 MCG/2ML IJ SOLN
INTRAMUSCULAR | Status: DC | PRN
  Administered 2022-10-18: 100 ug via INTRAVENOUS

## 2022-10-18 MED ORDER — DEXAMETHASONE SODIUM PHOSPHATE 10 MG/ML IJ SOLN
INTRAMUSCULAR | Status: DC | PRN
  Administered 2022-10-18: 4 mg via INTRAVENOUS

## 2022-10-18 MED ORDER — HYDROCODONE-ACETAMINOPHEN 5-325 MG PO TABS: 1.0000 | ORAL_TABLET | Freq: Four times a day (QID) | ORAL | 0 refills | Status: DC | PRN

## 2022-10-18 SURGICAL SUPPLY — 61 items
ADH SKN CLS APL DERMABOND .7 (GAUZE/BANDAGES/DRESSINGS) ×1
APL PRP STRL LF DISP 70% ISPRP (MISCELLANEOUS) ×1
APL SKNCLS STERI-STRIP NONHPOA (GAUZE/BANDAGES/DRESSINGS)
BAG COUNTER SPONGE SURGICOUNT (BAG) IMPLANT
BAG SPNG CNTER NS LX DISP (BAG)
BELT PT INTERSTIM MICRO SYSTEM (MISCELLANEOUS) ×1 IMPLANT
BELT SPRT LRG NS LF REUSE (MISCELLANEOUS) ×1
BENZOIN TINCTURE PRP APPL 2/3 (GAUZE/BANDAGES/DRESSINGS) IMPLANT
BLADE HEX COATED 2.75 (ELECTRODE) ×1 IMPLANT
BLADE SURG 15 STRL LF DISP TIS (BLADE) ×1 IMPLANT
BLADE SURG 15 STRL SS (BLADE) ×1
BLADE SURG SZ10 CARB STEEL (BLADE) IMPLANT
CHLORAPREP W/TINT 26 (MISCELLANEOUS) ×1 IMPLANT
COVER TRANSDUCER ULTRASND GEL (DISPOSABLE) ×1 IMPLANT
DERMABOND ADVANCED .7 DNX12 (GAUZE/BANDAGES/DRESSINGS) IMPLANT
DRAPE C-ARM 42X120 X-RAY (DRAPES) ×1 IMPLANT
DRAPE C-ARMOR (DRAPES) ×1 IMPLANT
DRAPE INCISE 23X17 STRL (DRAPES) ×1 IMPLANT
DRAPE INCISE IOBAN 23X17 STRL (DRAPES) ×1
DRAPE LAPAROSCOPIC ABDOMINAL (DRAPES) ×1 IMPLANT
DRAPE SHEET LG 3/4 BI-LAMINATE (DRAPES) IMPLANT
DRSG TEGADERM 2-3/8X2-3/4 SM (GAUZE/BANDAGES/DRESSINGS) ×1 IMPLANT
DRSG TEGADERM 4X4.75 (GAUZE/BANDAGES/DRESSINGS) ×2 IMPLANT
DRSG TEGADERM 6X8 (GAUZE/BANDAGES/DRESSINGS) IMPLANT
DRSG TELFA PLUS 4X6 ADH ISLAND (GAUZE/BANDAGES/DRESSINGS) ×1 IMPLANT
GAUZE 4X4 16PLY ~~LOC~~+RFID DBL (SPONGE) ×1 IMPLANT
GAUZE SPONGE 4X4 12PLY STRL (GAUZE/BANDAGES/DRESSINGS) ×1 IMPLANT
GLOVE SURG LX STRL 7.5 STRW (GLOVE) ×2 IMPLANT
GOWN STRL REUS W/ TWL XL LVL3 (GOWN DISPOSABLE) ×1 IMPLANT
GOWN STRL REUS W/TWL XL LVL3 (GOWN DISPOSABLE) ×1
KIT BASIN OR (CUSTOM PROCEDURE TRAY) ×1 IMPLANT
KIT HANDSET INTERSTIM COMM (NEUROSURGERY SUPPLIES) ×1 IMPLANT
KIT RECHARGE INTERSTIM (NEUROSURGERY SUPPLIES) ×1 IMPLANT
KIT TURNOVER KIT A (KITS) IMPLANT
LEAD INTERSTIM 4.32 33 L (Lead) IMPLANT
NDL FORAMN 5 20GA (NEUROSURGERY SUPPLIES) IMPLANT
NDL HYPO 25X1 1.5 SAFETY (NEEDLE) ×1 IMPLANT
NEEDLE FORAMN 5 20GA (NEUROSURGERY SUPPLIES) ×1
NEEDLE HYPO 25X1 1.5 SAFETY (NEEDLE) ×1
NEUROSTIMULATOR 1.7X2X.06 (UROLOGICAL SUPPLIES) ×1 IMPLANT
NS IRRIG 1000ML POUR BTL (IV SOLUTION) ×1 IMPLANT
PACK BASIC VI WITH GOWN DISP (CUSTOM PROCEDURE TRAY) ×1 IMPLANT
PACK GENERAL/GYN (CUSTOM PROCEDURE TRAY) ×1 IMPLANT
PAD TELFA 2X3 NADH STRL (GAUZE/BANDAGES/DRESSINGS) IMPLANT
PENCIL SMOKE EVACUATOR (MISCELLANEOUS) IMPLANT
SPIKE FLUID TRANSFER (MISCELLANEOUS) ×1 IMPLANT
STAPLER VISISTAT 35W (STAPLE) IMPLANT
STIMULATOR INTERSTIM 2X1.7X.3 (Miscellaneous) IMPLANT
SUT MNCRL AB 4-0 PS2 18 (SUTURE) ×2 IMPLANT
SUT SILK 2 0 (SUTURE)
SUT SILK 2 0 SH (SUTURE) IMPLANT
SUT SILK 2-0 18XBRD TIE 12 (SUTURE) IMPLANT
SUT VIC AB 2-0 UR6 27 (SUTURE) ×2 IMPLANT
SUT VIC AB 3-0 SH 27 (SUTURE) ×2
SUT VIC AB 3-0 SH 27X BRD (SUTURE) ×2 IMPLANT
SUT VIC AB 4-0 PS2 27 (SUTURE) ×2 IMPLANT
SYR CONTROL 10ML LL (SYRINGE) ×1 IMPLANT
TOWEL OR 17X26 10 PK STRL BLUE (TOWEL DISPOSABLE) ×1 IMPLANT
TOWEL OR NON WOVEN STRL DISP B (DISPOSABLE) ×1 IMPLANT
TRAY FOLEY MTR SLVR 16FR STAT (SET/KITS/TRAYS/PACK) IMPLANT
WATER STERILE IRR 1000ML POUR (IV SOLUTION) ×1 IMPLANT

## 2022-10-18 NOTE — Op Note (Addendum)
Preoperative diagnosis: Refractory urgency incontinence Postoperative diagnosis: Refractory urgency incontinence Surgery: Stage I and stage II InterStim placement plus impedance check Surgeon: Dr. Lorin Picket Brianca Fortenberry  The patient has the above diagnosis and consented the above procedure.  A team of people did an excellent job placing her in the prone position based upon body habitus and weight.  She was intubated because of the previous experience and for safety.  No muscle relaxants utilized during the surgery  We used fluoroscopy to mark the S3 foramina.  It was very challenging to get high-quality x-ray lateral but the AP films were very good.  This was based on scatter.  I would actually use a reverse image which was even better on the lateral x-rays.  In the case  A 5 inch foramen needle was used to find the S3 foramina on the right.  10 cc of the lidocaine epinephrine mixture was used prior.  I placed it twice to make certain I was medial and at 90 degrees and radiographically it was in excellent position.  She had good toe response at low amplitude  Inner aspect of foramen needle was removed and guide was placed the appropriate depth being careful because of visibility.  Scalpel incision was made.  White trocar was placed and easier to see to the appropriate depth.  Lead well-prepared coud shaped was placed to the appropriate depth and she had excellent toe and bellows response at all 4 settings at low amplitude  Importantly the 32 inch lead was utilized and definitely needed for the case based on body habitus  Under fluoroscopic guidance I removed the inner aspect of the lead as described.  AP and lateral x-rays were taken but kept in the hospital system and printed copies were not available  Using soft tissue and bony landmarks I marked a 4 and half centimeter right upper buttock incision.  I used 10 cc of lidocaine epinephrine mixture.  I used scalpel and cautery dissect down to the  appropriate depth and then finger dissected a nice pocket.  I brought the lead from medial to lateral at the appropriate depth with a passer.  Lead was connected to the IPG with screwdriver as described.  It was placed on the pocket tension free  Impedance check was normal in all 4 leads impressions  I closed the midline incision with 3 interrupted 4-0 Vicryl.  I closed right buttock incision with 3-0 Vicryl subcutaneous followed by 4-0 Monocryl.  Sterile dressing was applied  I was very pleased with the surgery based upon the challenges of body size and x-ray.  Hopefully she will get an excellent response

## 2022-10-18 NOTE — Anesthesia Preprocedure Evaluation (Signed)
Anesthesia Evaluation  Patient identified by MRN, date of birth, ID band Patient awake    Reviewed: Allergy & Precautions, H&P , NPO status , Patient's Chart, lab work & pertinent test results  Airway Mallampati: II   Neck ROM: full    Dental   Pulmonary asthma , sleep apnea , former smoker   breath sounds clear to auscultation       Cardiovascular hypertension, + dysrhythmias Atrial Fibrillation  Rhythm:regular Rate:Normal  TTE (2022): EF 55-60%, mild AS.   Neuro/Psych  PSYCHIATRIC DISORDERS Anxiety Depression       GI/Hepatic ,GERD  ,,  Endo/Other  diabetes, Type 2  Morbid obesity  Renal/GU      Musculoskeletal  (+) Arthritis ,    Abdominal   Peds  Hematology   Anesthesia Other Findings   Reproductive/Obstetrics                             Anesthesia Physical Anesthesia Plan  ASA: 3  Anesthesia Plan: General   Post-op Pain Management:    Induction: Intravenous  PONV Risk Score and Plan: 3 and Ondansetron, Dexamethasone, Midazolam and Treatment may vary due to age or medical condition  Airway Management Planned: Oral ETT  Additional Equipment:   Intra-op Plan:   Post-operative Plan: Extubation in OR  Informed Consent: I have reviewed the patients History and Physical, chart, labs and discussed the procedure including the risks, benefits and alternatives for the proposed anesthesia with the patient or authorized representative who has indicated his/her understanding and acceptance.     Dental advisory given  Plan Discussed with: CRNA, Anesthesiologist and Surgeon  Anesthesia Plan Comments:        Anesthesia Quick Evaluation

## 2022-10-18 NOTE — Discharge Instructions (Signed)
I have reviewed discharge instructions in detail with the patient. They will follow-up with me or their physician as scheduled. My nurse will also be calling the patients as per protocol.   

## 2022-10-18 NOTE — Progress Notes (Signed)
CRITICAL RESULT PROVIDER NOTIFICATION  Test performed and critical result:  Surgical PCR positive for MRSA  Date and time result received:  10/18/2022  1:05 PM   Provider name/title: Dr Alfredo Martinez  Date and time provider notified: 10/18/2022 1:05 PM  Date and time provider responded: 10/18/2022  1:06 PM  Provider response:No new orders, Provider made aware and stated he would take care of it

## 2022-10-18 NOTE — Anesthesia Postprocedure Evaluation (Signed)
Anesthesia Post Note  Patient: Joan Graves  Procedure(s) Performed: Leane Platt IMPLANT SECOND STAGE INTERSTIM IMPLANT FIRST STAGE     Patient location during evaluation: PACU Anesthesia Type: General Level of consciousness: awake and alert Pain management: pain level controlled Vital Signs Assessment: post-procedure vital signs reviewed and stable Respiratory status: spontaneous breathing, nonlabored ventilation, respiratory function stable and patient connected to nasal cannula oxygen Cardiovascular status: blood pressure returned to baseline and stable Postop Assessment: no apparent nausea or vomiting Anesthetic complications: no   No notable events documented.  Last Vitals:  Vitals:   10/18/22 1200 10/18/22 1215  BP: 108/62 119/63  Pulse: 67 68  Resp: 16 18  Temp:  (!) 36.4 C  SpO2: 97% 93%    Last Pain:  Vitals:   10/18/22 1215  TempSrc:   PainSc: 3                  Azarius Lambson S

## 2022-10-18 NOTE — Interval H&P Note (Signed)
History and Physical Interval Note:  10/18/2022 9:25 AM  Joan Graves  has presented today for surgery, with the diagnosis of REFRACTORY URGENCY INCONTINENCE.  The various methods of treatment have been discussed with the patient and family. After consideration of risks, benefits and other options for treatment, the patient has consented to  Procedure(s) with comments: INTERSTIM IMPLANT SECOND STAGE (N/A) - NEEDS TO BE INTUBATED WITH NO MUSCLE RELAXANT   NEEDS 75 MINS FOR THIS CASE INTERSTIM IMPLANT FIRST STAGE (N/A) as a surgical intervention.  The patient's history has been reviewed, patient examined, no change in status, stable for surgery.  I have reviewed the patient's chart and labs.  Questions were answered to the patient's satisfaction.     Tamorah Hada A Pamalee Marcoe

## 2022-10-18 NOTE — Anesthesia Procedure Notes (Signed)
Procedure Name: Intubation Date/Time: 10/18/2022 9:36 AM  Performed by: Lezlie Lye, CRNAPre-anesthesia Checklist: Patient identified, Emergency Drugs available, Suction available and Patient being monitored Patient Re-evaluated:Patient Re-evaluated prior to induction Oxygen Delivery Method: Circle System Utilized Preoxygenation: Pre-oxygenation with 100% oxygen Induction Type: IV induction and Rapid sequence Laryngoscope Size: Miller and 3 Grade View: Grade II Tube type: Oral Tube size: 7.5 mm Number of attempts: 1 Airway Equipment and Method: Stylet and Oral airway Placement Confirmation: ETT inserted through vocal cords under direct vision, positive ETCO2 and breath sounds checked- equal and bilateral Secured at: 22 cm Tube secured with: Tape Dental Injury: Teeth and Oropharynx as per pre-operative assessment

## 2022-10-18 NOTE — Transfer of Care (Signed)
Immediate Anesthesia Transfer of Care Note  Patient: Joan Graves  Procedure(s) Performed: Leane Platt IMPLANT SECOND STAGE INTERSTIM IMPLANT FIRST STAGE  Patient Location: PACU  Anesthesia Type:General  Level of Consciousness: awake and drowsy  Airway & Oxygen Therapy: Patient Spontanous Breathing and Patient connected to face mask oxygen  Post-op Assessment: Report given to RN and Post -op Vital signs reviewed and stable  Post vital signs: Reviewed and stable  Last Vitals:  Vitals Value Taken Time  BP 102/88 10/18/22 1105  Temp    Pulse 71 10/18/22 1107  Resp 19 10/18/22 1107  SpO2 97 % 10/18/22 1107  Vitals shown include unfiled device data.  Last Pain:  Vitals:   10/18/22 0804  TempSrc:   PainSc: 0-No pain         Complications: No notable events documented.

## 2022-10-20 ENCOUNTER — Encounter (HOSPITAL_COMMUNITY): Payer: Self-pay | Admitting: Urology

## 2022-11-07 ENCOUNTER — Encounter: Payer: Self-pay | Admitting: Emergency Medicine

## 2022-11-07 ENCOUNTER — Emergency Department: Payer: 59

## 2022-11-07 ENCOUNTER — Emergency Department
Admission: EM | Admit: 2022-11-07 | Discharge: 2022-11-07 | Disposition: A | Discharge status: Home / Self Care | Payer: 59 | Attending: Emergency Medicine | Admitting: Emergency Medicine

## 2022-11-07 ENCOUNTER — Other Ambulatory Visit: Payer: Self-pay

## 2022-11-07 DIAGNOSIS — Z7901 Long term (current) use of anticoagulants: Secondary | ICD-10-CM | POA: Diagnosis not present

## 2022-11-07 DIAGNOSIS — L03115 Cellulitis of right lower limb: Secondary | ICD-10-CM | POA: Diagnosis not present

## 2022-11-07 DIAGNOSIS — S91301A Unspecified open wound, right foot, initial encounter: Secondary | ICD-10-CM | POA: Insufficient documentation

## 2022-11-07 DIAGNOSIS — X58XXXA Exposure to other specified factors, initial encounter: Secondary | ICD-10-CM | POA: Diagnosis not present

## 2022-11-07 DIAGNOSIS — E119 Type 2 diabetes mellitus without complications: Secondary | ICD-10-CM | POA: Insufficient documentation

## 2022-11-07 LAB — CBC WITH DIFFERENTIAL/PLATELET
Abs Immature Granulocytes: 0.01 10*3/uL (ref 0.00–0.07)
Basophils Absolute: 0 10*3/uL (ref 0.0–0.1)
Basophils Relative: 0 %
Eosinophils Absolute: 0.1 10*3/uL (ref 0.0–0.5)
Eosinophils Relative: 2 %
HCT: 33 % — ABNORMAL LOW (ref 36.0–46.0)
Hemoglobin: 11.9 g/dL — ABNORMAL LOW (ref 12.0–15.0)
Immature Granulocytes: 0 %
Lymphocytes Relative: 19 %
Lymphs Abs: 0.7 10*3/uL (ref 0.7–4.0)
MCH: 33.5 pg (ref 26.0–34.0)
MCHC: 36.1 g/dL — ABNORMAL HIGH (ref 30.0–36.0)
MCV: 93 fL (ref 80.0–100.0)
Monocytes Absolute: 0.3 10*3/uL (ref 0.1–1.0)
Monocytes Relative: 8 %
Neutro Abs: 2.8 10*3/uL (ref 1.7–7.7)
Neutrophils Relative %: 71 %
Platelets: 213 10*3/uL (ref 150–400)
RBC: 3.55 MIL/uL — ABNORMAL LOW (ref 3.87–5.11)
RDW: 17.9 % — ABNORMAL HIGH (ref 11.5–15.5)
WBC: 4 10*3/uL (ref 4.0–10.5)
nRBC: 0 % (ref 0.0–0.2)

## 2022-11-07 LAB — BASIC METABOLIC PANEL
Anion gap: 13 (ref 5–15)
BUN: 23 mg/dL — ABNORMAL HIGH (ref 6–20)
CO2: 24 mmol/L (ref 22–32)
Calcium: 8.8 mg/dL — ABNORMAL LOW (ref 8.9–10.3)
Chloride: 99 mmol/L (ref 98–111)
Creatinine, Ser: 0.97 mg/dL (ref 0.44–1.00)
GFR, Estimated: >60 mL/min (ref >60)
Glucose, Bld: 212 mg/dL — ABNORMAL HIGH (ref 70–99)
Potassium: 3.9 mmol/L (ref 3.5–5.1)
Sodium: 136 mmol/L (ref 135–145)

## 2022-11-07 LAB — POC URINE PREG, ED: Preg Test, Ur: NEGATIVE

## 2022-11-07 MED ORDER — CEPHALEXIN 500 MG PO CAPS: 500.0000 mg | ORAL_CAPSULE | Freq: Four times a day (QID) | ORAL | 0 refills | Status: AC

## 2022-11-07 MED ORDER — CEPHALEXIN 500 MG PO CAPS
500.0000 mg | ORAL_CAPSULE | Freq: Once | ORAL | Status: AC
  Administered 2022-11-07: 500 mg via ORAL
  Filled 2022-11-07: qty 1

## 2022-11-07 NOTE — Discharge Instructions (Signed)
Take antibiotics for the full course as prescribed.  Take Tylenol 650 mg every 6 hours for pain.  Call Dr. Excell Seltzer of podiatry for a follow-up appointment to check on that wound on your heel that has had difficulty healing over the last 1 month.  Thank you for choosing [place] for your health care today!  Please see your primary doctor this week for a follow up appointment.   If you have any new, worsening, or unexpected symptoms call your doctor right away or come back to the emergency department for reevaluation.  It was my pleasure to care for you today.   Daneil Dan Modesto Charon, MD

## 2022-11-07 NOTE — ED Triage Notes (Addendum)
First Nurse Note: Patient to ED via [facility] from home for right lower leg pain when walking. Swelling noted in ankle. Ongoing x2 days. Denies fevers. Hx of diabetes. VS WNL Leg noted to be bright red in color.

## 2022-11-07 NOTE — Group Note (Deleted)

## 2022-11-07 NOTE — ED Notes (Signed)
Pt verbalizes understanding of discharge instructions. Opportunity for questioning and answers were provided. Pt discharged from ED to home.   ? ?

## 2022-11-07 NOTE — ED Provider Notes (Signed)
[facility] Provider Note    Event Date/Time   First MD Initiated Contact with Patient 11/07/22 1023     (approximate)   History   Leg Swelling   HPI  Joan Graves is a 52 y.o. female   Past medical history of pulmonary hypertension, diabetes, atrial fibrillation on Eliquis who presents emergency department with right shin redness, pain, over the last several days.  She has a nonhealing wound on her right heel as well.  She denies fever or chills.  No other acute medical complaints.   External Medical Documents Reviewed: [facility] Burke clinical summary reviewing past medical history medications      Physical Exam   Triage Vital Signs: ED Triage Vitals  Encounter Vitals Group     BP 11/07/22 1003 (!) 118/99     Systolic BP Percentile --      Diastolic BP Percentile --      Pulse Rate 11/07/22 1003 (!) 59     Resp 11/07/22 1003 18     Temp 11/07/22 1003 (!) 97.5 F (36.4 C)     Temp Source 11/07/22 1003 Oral     SpO2 11/07/22 1003 97 %     Weight 11/07/22 0958 (!) 327 lb (148.3 kg)     Height 11/07/22 0958 5\' 3"  (1.6 m)     Head Circumference --      Peak Flow --      Pain Score 11/07/22 0958 8     Pain Loc --      Pain Education --      Exclude from Growth Chart --     Most recent vital signs: Vitals:   11/07/22 1003  BP: (!) 118/99  Pulse: (!) 59  Resp: 18  Temp: (!) 97.5 F (36.4 C)  SpO2: 97%    General: Awake, no distress.  CV:  Good peripheral perfusion.  Resp:  Normal effort.  Abd:  No distention.  Other:  Redness, warmth to the right lower leg, and a small laceration to the heel of the right foot, there is approximately 5 mm deep, I can see the wound edges, and there is no purulence pain out of proportion or fluctuance surrounding that wound.  There is no pain out of proportion or crepitus to the lower leg.  She has strong pedal pulse to the affected lower extremity.   ED Results / Procedures / Treatments    Labs (all labs ordered are listed, but only abnormal results are displayed) Labs Reviewed  CBC WITH DIFFERENTIAL/PLATELET - Abnormal; Notable for the following components:      Result Value   RBC 3.55 (*)    Hemoglobin 11.9 (*)    HCT 33.0 (*)    MCHC 36.1 (*)    RDW 17.9 (*)    All other components within normal limits  BASIC METABOLIC PANEL - Abnormal; Notable for the following components:   Glucose, Bld 212 (*)    BUN 23 (*)    Calcium 8.8 (*)    All other components within normal limits  POC URINE PREG, ED     I ordered and reviewed the above labs they are notable for white blood cell count within normal limits and H&H at baseline.    RADIOLOGY I independently reviewed and interpreted x-ray of the foot and see no obvious bony erosive changes near the heel I also reviewed radiologist's formal read.   PROCEDURES:  Critical Care performed: No  Procedures   MEDICATIONS  ORDERED IN ED: Medications  cephALEXin (KEFLEX) capsule 500 mg (500 mg Oral Given 11/07/22 1105)    IMPRESSION / MDM / ASSESSMENT AND PLAN / ED COURSE  I reviewed the triage vital signs and the nursing notes.                                Patient's presentation is most consistent with acute presentation with potential threat to life or bodily function.  Differential diagnosis includes, but is not limited to, cellulitis, claudication/ischemia, osteomyelitis, DVT, necrotizing fasciitis   The patient is on the cardiac monitor to evaluate for evidence of arrhythmia and/or significant heart rate changes.  MDM:    Cellulitic changes with a wound to the right heel with poor wound healing in this diabetic patient.  Treat with cellulitis coverage Keflex.  Obtain x-ray for signs of osteomyelitis to the affected heel, I see no signs of this condition pending final read from radiology.  HD appropriate reassuring she is nontoxic-appearing and no leukocytosis I doubt sepsis.  I considered DVT but  doubt because she is on Eliquis.  I considered claudication/ischemia but doubtful given cellulitic changes seen on clinical exam and bounding pedal pulses noted on exam.  Also considered necrotizing fasciitis but doubt given no pain out of proportion or crepitus.  Pending final read of x-ray, if normal plan for outpatient treatment of cellulitis, referral to podiatry for further assessment of this nonhealing heel wound.         FINAL CLINICAL IMPRESSION(S) / ED DIAGNOSES   Final diagnoses:  Cellulitis of right leg  Non-healing open wound of heel, right, initial encounter     Rx / DC Orders   ED Discharge Orders          Ordered    cephALEXin (KEFLEX) 500 MG capsule  4 times daily        11/07/22 1047             Note:  This document was prepared using Dragon voice recognition software and may include unintentional dictation errors.    Pilar Jarvis, MD 11/07/22 [phone]

## 2022-11-14 ENCOUNTER — Ambulatory Visit: Payer: Self-pay | Admitting: Radiation Oncology

## 2022-12-28 ENCOUNTER — Ambulatory Visit
Admission: EM | Admit: 2022-12-28 | Discharge: 2022-12-28 | Disposition: A | Discharge status: Home / Self Care | Payer: 59 | Attending: Family Medicine | Admitting: Family Medicine

## 2022-12-28 DIAGNOSIS — N764 Abscess of vulva: Secondary | ICD-10-CM

## 2022-12-28 MED ORDER — DOXYCYCLINE HYCLATE 100 MG PO CAPS: 100.0000 mg | ORAL_CAPSULE | Freq: Two times a day (BID) | ORAL | 0 refills | Status: DC

## 2022-12-28 NOTE — ED Triage Notes (Addendum)
Patein states that she has a bump on her vagina. 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 He states that it hurts to touch. Noticed it today.

## 2022-12-28 NOTE — ED Provider Notes (Signed)
[facility]    CSN: [id number] Arrival date & time: 12/28/22  0906      History   Chief Complaint Chief Complaint  Patient presents with   vaginal issue    HPI Joan Graves is a 52 y.o. female.   HPI  Joan Graves presents for vaginal bump on the left side of her vagina.  She found it this morning in the shower but was feeling bad yesterday.  Reports history of MRSA infections.  Endorses body aches, chills and fatigue.  No fever.    Past Medical History:  Diagnosis Date   Anemia    Anxiety    Aortic valve stenosis    Arthritis    Asthma    Bradycardia    Cancer (HCC)    endometrial cancer   Chronic pulmonary hypertension (HCC)    Depression    Diabetes mellitus without complication (HCC)    Dysrhythmia    Endometrial cancer (HCC)    GERD (gastroesophageal reflux disease)    Heart murmur    History of radiation therapy    Endometrial- HDR- 06/13/22-07/11/22- Dr. Antony Blackbird   Hypercholesteremia    Hypertension    Paroxysmal atrial fibrillation (HCC)    Paroxysmal atrial fibrillation [facility])    Sleep apnea     Patient Active Problem List   Diagnosis Date Noted   Cellulitis of nose 03/26/2022   Endometrial cancer (HCC) 03/26/2022   Diabetes mellitus without complication (HCC) 03/26/2022   Morbid obesity with BMI of 50.0-59.9, adult (HCC) 03/26/2022   HLD (hyperlipidemia) 03/26/2022   Depression with anxiety 03/26/2022   Abscess of nose 03/26/2022   Family history of ovarian cancer 02/03/2022   PMB (postmenopausal bleeding) 02/03/2022   Esophageal dysphagia    Problems with swallowing and mastication    Paroxysmal atrial fibrillation (HCC) 12/09/2020   Chronic pulmonary hypertension (HCC) 06/11/2020   Moderate aortic valve stenosis 06/11/2020   Benign essential hypertension 12/02/2014   Hyperlipidemia, mixed 12/11/2013   Obstructive sleep apnea 12/11/2013   Asthma 12/09/2013   Diabetes (HCC) 12/09/2013   Gastroesophageal reflux  disease 12/09/2013    Past Surgical History:  Procedure Laterality Date   CARDIOVERSION N/A 12/30/2020   Procedure: CARDIOVERSION;  Surgeon: Lamar Blinks, MD;  Location: [facility] ORS;  Service: Cardiovascular;  Laterality: N/A;   CARDIOVERSION N/A 08/24/2022   Procedure: CARDIOVERSION;  Surgeon: Alwyn Pea, MD;  Location: [facility] ORS;  Service: Cardiovascular;  Laterality: N/A;   CATARACT EXTRACTION Right 11/2021   CATARACT EXTRACTION Left 12/2021   COLONOSCOPY     COLONOSCOPY WITH PROPOFOL N/A 08/18/2022   Procedure: COLONOSCOPY WITH PROPOFOL;  Surgeon: Jaynie Collins, DO;  Location: [facility] ENDOSCOPY;  Service: Gastroenterology;  Laterality: N/A;  IDDM   ESOPHAGOGASTRODUODENOSCOPY (EGD) WITH PROPOFOL N/A 06/29/2021   Procedure: ESOPHAGOGASTRODUODENOSCOPY (EGD) WITH PROPOFOL;  Surgeon: Midge Minium, MD;  Location: [facility] ENDOSCOPY;  Service: Endoscopy;  Laterality: N/A;   INTERSTIM IMPLANT PLACEMENT N/A 10/18/2022   Procedure: Leane Platt IMPLANT SECOND STAGE;  Surgeon: Alfredo Martinez, MD;  Location: [facility] ORS;  Service: Urology;  Laterality: N/A;  NEEDS TO BE INTUBATED WITH NO MUSCLE RELAXANT   NEEDS 75 MINS FOR THIS CASE   INTERSTIM IMPLANT PLACEMENT N/A 10/18/2022   Procedure: Leane Platt IMPLANT FIRST STAGE;  Surgeon: Alfredo Martinez, MD;  Location: [facility] ORS;  Service: Urology;  Laterality: N/A;   POLYPECTOMY  08/18/2022   Procedure: POLYPECTOMY;  Surgeon: Jaynie Collins, DO;  Location: [facility] ENDOSCOPY;  Service: Gastroenterology;;   ROBOTIC ASSISTED  TOTAL HYSTERECTOMY WITH BILATERAL SALPINGO OOPHERECTOMY Bilateral 04/12/2022   Procedure: XI ROBOTIC ASSISTED TOTAL HYSTERECTOMY WITH BILATERAL SALPINGO OOPHORECTOMY;  Surgeon: Clide Cliff, MD;  Location: [facility] ORS;  Service: Gynecology;  Laterality: Bilateral;   SENTINEL NODE BIOPSY N/A 04/12/2022   Procedure: SENTINEL NODE INJECTION;  Surgeon: Clide Cliff, MD;  Location: [facility] ORS;  Service: Gynecology;  Laterality: N/A;   TUBAL  LIGATION  2000    OB History     Gravida  1   Para  1   Term  1   Preterm      AB      Living  1      SAB      IAB      Ectopic      Multiple      Live Births  1            Home Medications    Prior to Admission medications   Medication Sig Start Date End Date Taking? Authorizing Provider  ACCU-CHEK SOFTCLIX LANCETS lancets CHECK GLUCOSE TWICE DIALY FOR GLUCOSE MONITORING E11.9 06/09/17  Yes [provider]  ADVAIR DISKUS 500-50 MCG/DOSE AEPB Inhale 2 puffs into the lungs daily. 04/24/17  Yes [provider]  amiodarone (PACERONE) 200 MG tablet Take 400 mg by mouth daily.   Yes [provider]  amLODipine (NORVASC) 10 MG tablet Take 1 tablet by mouth daily. 10/17/18  Yes [provider]  apixaban (ELIQUIS) 5 MG TABS tablet Take 10 mg by mouth daily.   Yes [provider]  atorvastatin (LIPITOR) 20 MG tablet Take 20 mg by mouth daily. 10/04/19  Yes [provider]  buPROPion (WELLBUTRIN SR) 100 MG 12 hr tablet Take 200 mg by mouth daily. 08/10/20  Yes [provider]  doxycycline (VIBRAMYCIN) 100 MG capsule Take 1 capsule (100 mg total) by mouth 2 (two) times daily. 12/28/22  Yes Sebert Stollings, DO  enalapril (VASOTEC) 20 MG tablet Take 20 mg by mouth daily. 02/13/20  Yes [provider]  ferrous sulfate 325 (65 FE) MG EC tablet Take 650 mg by mouth daily with breakfast. 11/30/15  Yes [provider]  furosemide (LASIX) 20 MG tablet Take 20 mg by mouth daily.   Yes [provider]  glipiZIDE (GLUCOTROL XL) 10 MG 24 hr tablet Take 10 mg by mouth daily. 06/21/22  Yes [provider]  glucose blood test strip USE AS DIRECTED TWICE A DAY 07/08/15  Yes [provider]  hydrochlorothiazide (MICROZIDE) 12.5 MG capsule Take 12.5 mg by mouth daily.   Yes [provider]  Insulin Glargine (BASAGLAR KWIKPEN) 100 UNIT/ML Inject 20 Units into the skin at bedtime as  needed (if CBG over 200). 03/28/22  Yes Marrion Coy, MD  JARDIANCE 25 MG TABS tablet Take 25 mg by mouth daily. 07/16/20  Yes [provider]  loratadine (CLARITIN) 10 MG tablet Take 10 mg by mouth daily.   Yes [provider]  metFORMIN (GLUCOPHAGE-XR) 500 MG 24 hr tablet Take 2,000 mg by mouth daily. 05/26/17  Yes [provider]  metoprolol tartrate (LOPRESSOR) 25 MG tablet Take 37.5 mg by mouth daily. 03/25/22  Yes [provider]  naproxen sodium (ALEVE) 220 MG tablet Take 220 mg by mouth daily as needed (headaches).   Yes [provider]  omeprazole (PRILOSEC) 40 MG capsule Take 40 mg by mouth daily. 12/15/21  Yes [provider]  HYDROcodone-acetaminophen (NORCO) 5-325 MG tablet Take 1-2 tablets by mouth every 6 (  six) hours as needed for moderate pain. 10/18/22   Alfredo Martinez, MD    Family History Family History  Problem Relation Age of Onset   Ovarian cancer Mother        mid 11s   Lung cancer Mother 18   Throat cancer Maternal Grandmother    Colon cancer Neg Hx    Breast cancer Neg Hx    Endometrial cancer Neg Hx    Pancreatic cancer Neg Hx    Prostate cancer Neg Hx     Social History Social History   Tobacco Use   Smoking status: Former    Types: Cigarettes    Passive exposure: Past   Smokeless tobacco: Never  Vaping Use   Vaping status: Every Day   Substances: Nicotine, Flavoring  Substance Use Topics   Alcohol use: Yes    Alcohol/week: 2.0 standard drinks of alcohol    Types: 1 Glasses of wine, 1 Shots of liquor per week    Comment: occ   Drug use: Never     Allergies   Aspirin   Review of Systems Review of Systems :negative unless otherwise stated in HPI.      Physical Exam Triage Vital Signs ED Triage Vitals  Encounter Vitals Group     BP 12/28/22 0924 126/72     Systolic BP Percentile --      Diastolic BP Percentile --      Pulse Rate 12/28/22 0924 (!) 57     Resp 12/28/22 0924 18      Temp 12/28/22 0924 98.5 F (36.9 C)     Temp Source 12/28/22 0924 Oral     SpO2 12/28/22 0924 98 %     Weight --      Height --      Head Circumference --      Peak Flow --      Pain Score 12/28/22 0922 5     Pain Loc --      Pain Education --      Exclude from Growth Chart --    No data found.  Updated Vital Signs BP 126/72 (BP Location: Right Arm)   Pulse (!) 57   Temp 98.5 F (36.9 C) (Oral)   Resp 18   LMP 10/06/2017 (Approximate) Comment: Pt did not have period for approx 4 years then started periods again this spring, irregular. neg preg test  SpO2 98%   Visual Acuity Right Eye Distance:   Left Eye Distance:   Bilateral Distance:    Right Eye Near:   Left Eye Near:    Bilateral Near:     Physical Exam  GEN: alert, well appearing obese female, in no acute distress  EYES: extra occular movements intact, no scleral injection RESP: no increased work of breathing GU: External vaginal exam performed, chaperoned by [employer]; left vulva with 3 cm area of edema with induration without fluctuance or discharge.  SKIN: warm and dry; see GU above    UC Treatments / Results  Labs (all labs ordered are listed, but only abnormal results are displayed) Labs Reviewed - No data to display  EKG   Radiology No results found.  Procedures Procedures (including critical care time)  Medications Ordered in UC Medications - No data to display  Initial Impression / Assessment and Plan / UC Course  I have reviewed the triage vital signs and the nursing notes.  Pertinent labs & imaging results that were available during my care of the patient were  reviewed by me and considered in my medical decision making (see chart for details).     Patient is a 52 y.o. femalewho presents for vaginal lesion.  Overall, patient is well-appearing and well-hydrated.  Vital signs stable.  Joan Graves is afebrile.  Exam concerning for early abscess.  No I&D recommended for now.  Patient to return to  the urgent care for I&D if pain does not improve or spontaneous drainage in the next 72 hours.  Treat with doxycycline as she has history of MRSA infections.   Reviewed expectations regarding course of current medical issues.  All questions asked were answered.  Outlined signs and symptoms indicating need for more acute intervention. Patient verbalized understanding. After Visit Summary given.   Final Clinical Impressions(s) / UC Diagnoses   Final diagnoses:  Vulval boil     Discharge Instructions      Stop by the pharmacy to pick up your antibiotics.  Take your antibiotics twice a day for the next 10 days.  Use warm compresses 3-4 times a day to get the boil to come to ahead.  It may spontaneously drain.  If the pain does not improve after 72 hours, return to the urgent care or go to the emergency department to have this drained.     ED Prescriptions     Medication Sig Dispense Auth. Provider   doxycycline (VIBRAMYCIN) 100 MG capsule Take 1 capsule (100 mg total) by mouth 2 (two) times daily. 20 capsule Katha Cabal, DO      PDMP not reviewed this encounter.              Katha Cabal, DO 12/28/22 1026

## 2022-12-28 NOTE — Discharge Instructions (Addendum)
Stop by the pharmacy to pick up your antibiotics.  Take your antibiotics twice a day for the next 10 days.  Use warm compresses 3-4 times a day to get the boil to come to ahead.  It may spontaneously drain.  If the pain does not improve after 72 hours, return to the urgent care or go to the emergency department to have this drained.

## 2022-12-29 ENCOUNTER — Emergency Department
Admission: EM | Admit: 2022-12-29 | Discharge: 2022-12-30 | Disposition: A | Discharge status: Home / Self Care | Payer: 59 | Attending: Emergency Medicine | Admitting: Emergency Medicine

## 2022-12-29 ENCOUNTER — Other Ambulatory Visit: Payer: Self-pay

## 2022-12-29 ENCOUNTER — Emergency Department: Payer: 59

## 2022-12-29 DIAGNOSIS — I87321 Chronic venous hypertension (idiopathic) with inflammation of right lower extremity: Secondary | ICD-10-CM | POA: Diagnosis not present

## 2022-12-29 DIAGNOSIS — I890 Lymphedema, not elsewhere classified: Secondary | ICD-10-CM | POA: Insufficient documentation

## 2022-12-29 DIAGNOSIS — M7989 Other specified soft tissue disorders: Secondary | ICD-10-CM | POA: Diagnosis present

## 2022-12-29 DIAGNOSIS — I872 Venous insufficiency (chronic) (peripheral): Secondary | ICD-10-CM

## 2022-12-29 NOTE — ED Triage Notes (Addendum)
Pt to ED via [facility] c/o right leg swellingx1 day. Pt reports right leg below the knee has been swelling since eysterday. Seen yesterdat at [facility] for MRSA in vagina. Prescribed doxycyline. No pain unless pressed on. No warmth. Redness to both lower legs. Pt ambulatory. Denies fevers at home

## 2022-12-30 DIAGNOSIS — I87321 Chronic venous hypertension (idiopathic) with inflammation of right lower extremity: Secondary | ICD-10-CM | POA: Diagnosis not present

## 2022-12-30 MED ORDER — CEPHALEXIN 500 MG PO CAPS
500.0000 mg | ORAL_CAPSULE | Freq: Once | ORAL | Status: AC
  Administered 2022-12-30: 500 mg via ORAL
  Filled 2022-12-30: qty 1

## 2022-12-30 MED ORDER — CEPHALEXIN 500 MG PO CAPS: 500.0000 mg | ORAL_CAPSULE | Freq: Four times a day (QID) | ORAL | 0 refills | Status: DC

## 2022-12-30 NOTE — ED Notes (Signed)
Patient given discharge instructions including prescriptions x1 and importance of follow up appt with stated understanding. Patient stable and ambulatory with steady even gait on dispo.

## 2022-12-30 NOTE — ED Provider Notes (Signed)
[facility] Provider Note    Event Date/Time   First MD Initiated Contact with Patient 12/29/22 2354     (approximate)   History   Leg Swelling   HPI Joan Graves is a 52 y.o. female who presents for evaluation of right lower leg swelling.  The symptoms started relatively recently.  She was just seen and diagnosed with an early vaginal abscess and placed on doxycycline.  She said that her legs are always a little bit swollen but that the right lower leg has gotten more swollen and become red.  Not particularly painful, just a little bit.  This has happened before and she was told that she had cellulitis.  She denies any fever, chest pain, shortness of breath, nausea, vomiting, and abdominal pain.     Physical Exam   Triage Vital Signs: ED Triage Vitals  Encounter Vitals Group     BP 12/29/22 2011 111/64     Systolic BP Percentile --      Diastolic BP Percentile --      Pulse Rate 12/29/22 2011 72     Resp 12/29/22 2011 18     Temp 12/29/22 2011 98.4 F (36.9 C)     Temp Source 12/30/22 0036 Axillary     SpO2 12/29/22 2011 98 %     Weight 12/29/22 2009 (!) 152 kg (335 lb)     Height 12/29/22 2009 1.6 m (5\' 3" )     Head Circumference --      Peak Flow --      Pain Score 12/29/22 2009 0     Pain Loc --      Pain Education --      Exclude from Growth Chart --     Most recent vital signs: Vitals:   12/30/22 0036 12/30/22 0100  BP: 133/66 118/69  Pulse: 66 61  Resp: 18   Temp: 97.6 F (36.4 C)   SpO2: 98% 100%    General: Awake, no distress.  CV:  Good peripheral perfusion.  Resp:  Normal effort. Speaking easily and comfortably, no accessory muscle usage nor intercostal retractions.   Abd:  No distention.  Obese. Other:  Sequela of chronic peripheral edema/lymphedema with skin thickening and skin darkening suggestive of chronic venous stasis.  Her right leg does appear slightly more swollen than the left and there is erythema  consistent with either venous stasis dermatitis or developing cellulitis.  This does not extend up as far as the knee although it is close.  No significant tenderness to palpation.  No ulcers or sores or blisters.   ED Results / Procedures / Treatments   Labs (all labs ordered are listed, but only abnormal results are displayed) Labs Reviewed - No data to display   RADIOLOGY I viewed and interpreted the patient's ultrasound and I see no obvious abnormality.  Radiology confirms no DVT.   PROCEDURES:  Critical Care performed: No  Procedures    IMPRESSION / MDM / ASSESSMENT AND PLAN / ED COURSE  I reviewed the triage vital signs and the nursing notes.                              Differential diagnosis includes, but is not limited to, cellulitis, DVT, chronic venous stasis, venous stasis dermatitis.  Patient's presentation is most consistent with acute presentation with potential threat to life or bodily function.  Labs/studies ordered: Venous Doppler ultrasound of  the right lower extremity  Interventions/Medications given:  Medications  cephALEXin (KEFLEX) capsule 500 mg (500 mg Oral Given 12/30/22 0034)    (Note:  hospital course my include additional interventions and/or labs/studies not listed above.)   Although typically I would expect that the presentation is "just" venous stasis dermatitis, the erythema on the medial aspect of the right lower leg appears more consistent with cellulitis while the more distal portions of the right lower leg appear more chronic.  Given the concern that untreated cellulitis will spread rapidly, I will start her on antibiotics empirically.  She was given Keflex 500 mg by mouth in the ED and I gave her prescription and instructed her to complete the full course.  I strongly encouraged close outpatient follow-up and gave her my other usual and customary management recommendations and return precautions.  She states that she understands and  agrees with the plan.         FINAL CLINICAL IMPRESSION(S) / ED DIAGNOSES   Final diagnoses:  Lymphedema  Venous stasis dermatitis of right lower extremity     Rx / DC Orders   ED Discharge Orders          Ordered    cephALEXin (KEFLEX) 500 MG capsule  4 times daily        12/30/22 0100             Note:  This document was prepared using Dragon voice recognition software and may include unintentional dictation errors.   Loleta Rose, MD 12/30/22 [phone]

## 2022-12-30 NOTE — Discharge Instructions (Signed)
We believe that your leg is swollen and inflamed more because of difficulty with your circulation and inflammation rather than infection, but we will be safe and treat you for possible cellulitis.  Please take the full course of doxycycline you are prescribed previously as well as the full course of cephalexin (Keflex) that you were prescribed during this visit.  Follow-up with your primary care doctor at the next available opportunity.  Please read through the included information and try keeping your legs elevated when possible.

## 2023-01-09 DIAGNOSIS — I3481 Nonrheumatic mitral (valve) annulus calcification: Secondary | ICD-10-CM | POA: Insufficient documentation

## 2023-01-27 IMAGING — MG MM DIGITAL SCREENING BILAT W/ TOMO AND CAD
6 of 10 series · 6 of 30 positions shown · non-contrast
Comparison: Previous exam(s).

ACR Breast Density Category a: The breast tissue is almost entirely
fatty.

CLINICAL DATA: Screening.

EXAM:
DIGITAL SCREENING BILATERAL MAMMOGRAM WITH TOMOSYNTHESIS AND CAD
TECHNIQUE: Bilateral screening digital craniocaudal and mediolateral oblique
mammograms were obtained. Bilateral screening digital breast
tomosynthesis was performed. The images were evaluated with
computer-aided detection.

[R CV synth-2D]
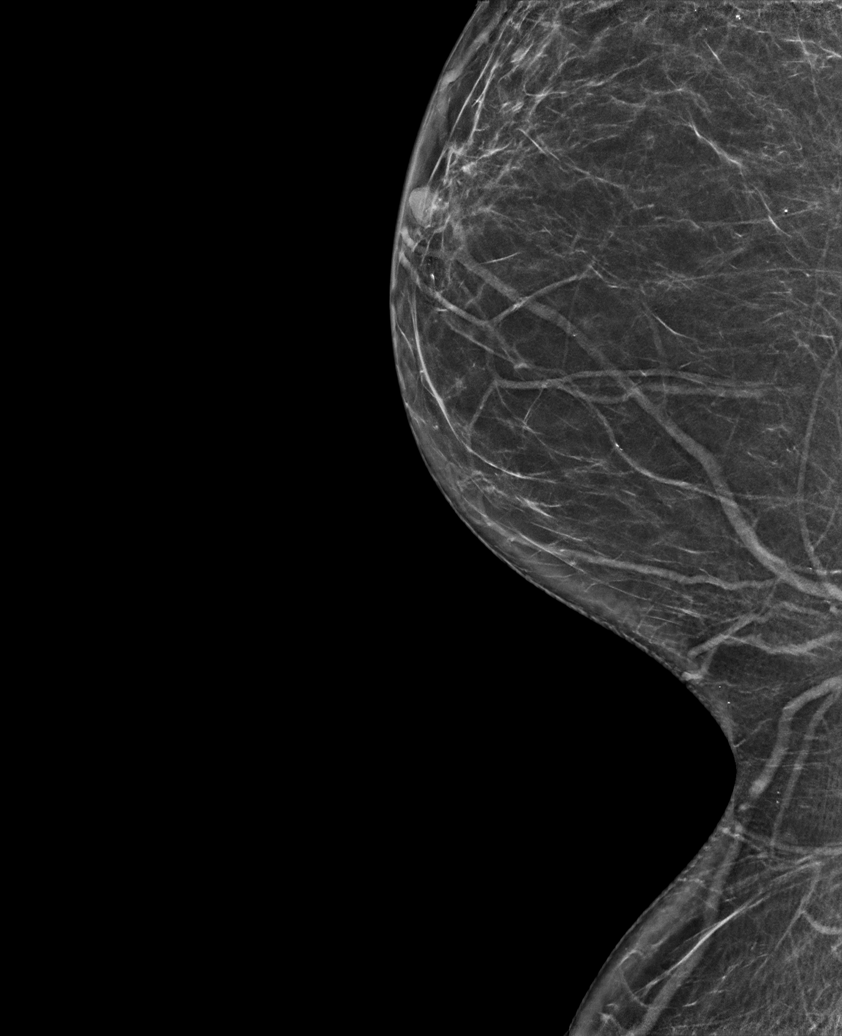

[L CC synth-2D]
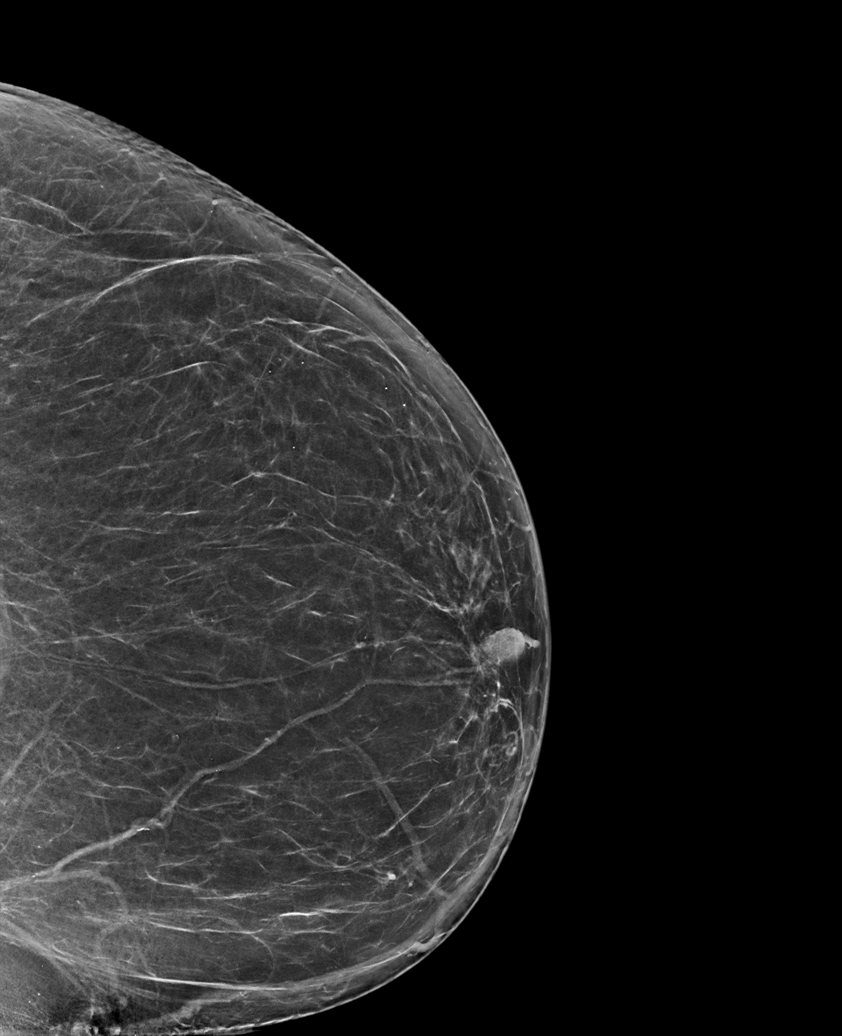

[L MLO synth-2D]
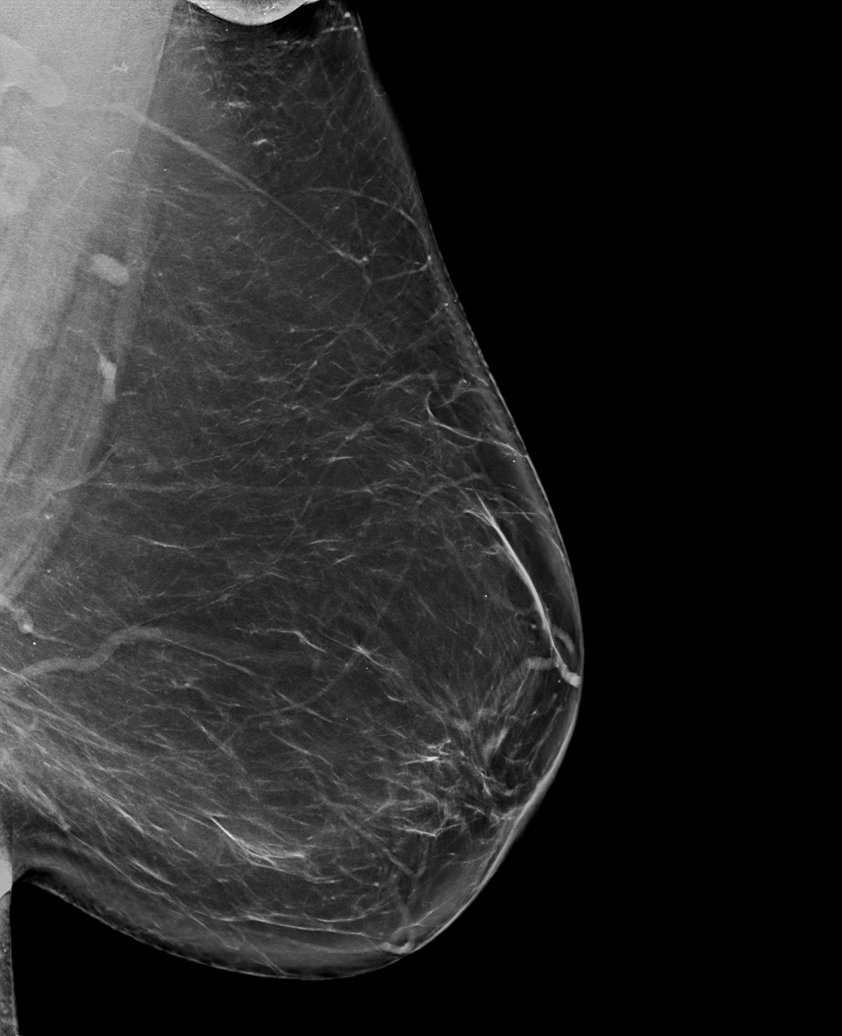

[R CC synth-2D]
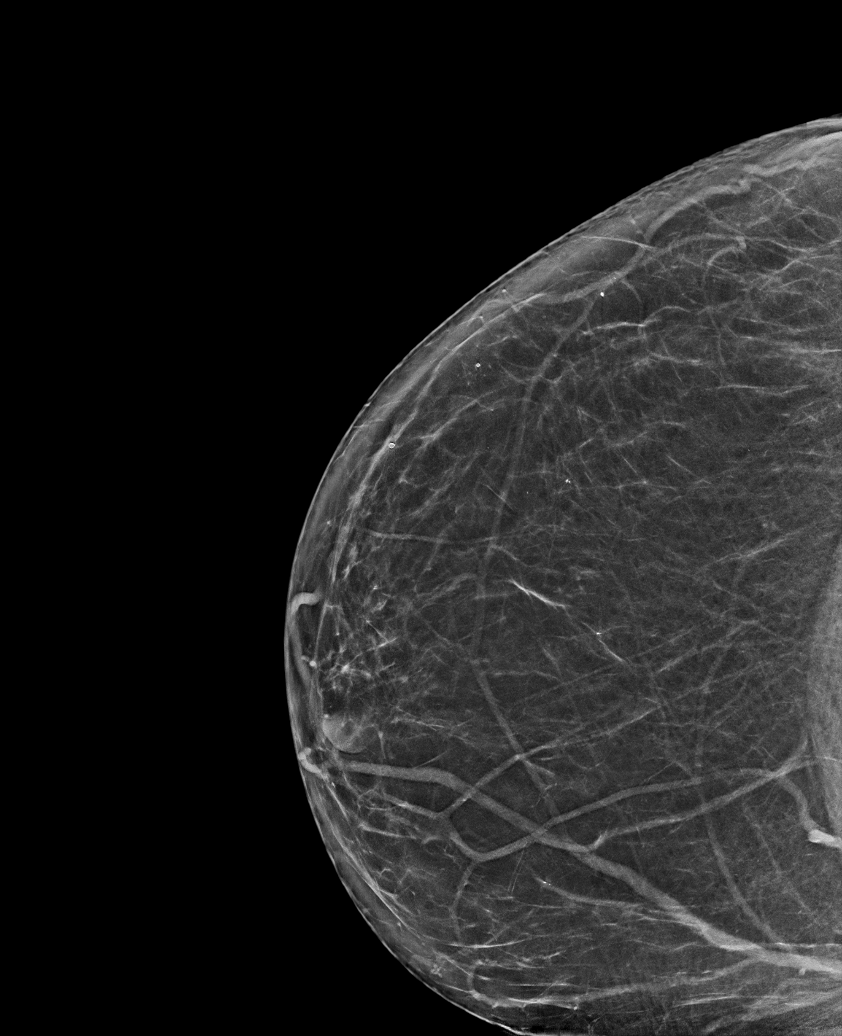

[R MLO synth-2D]
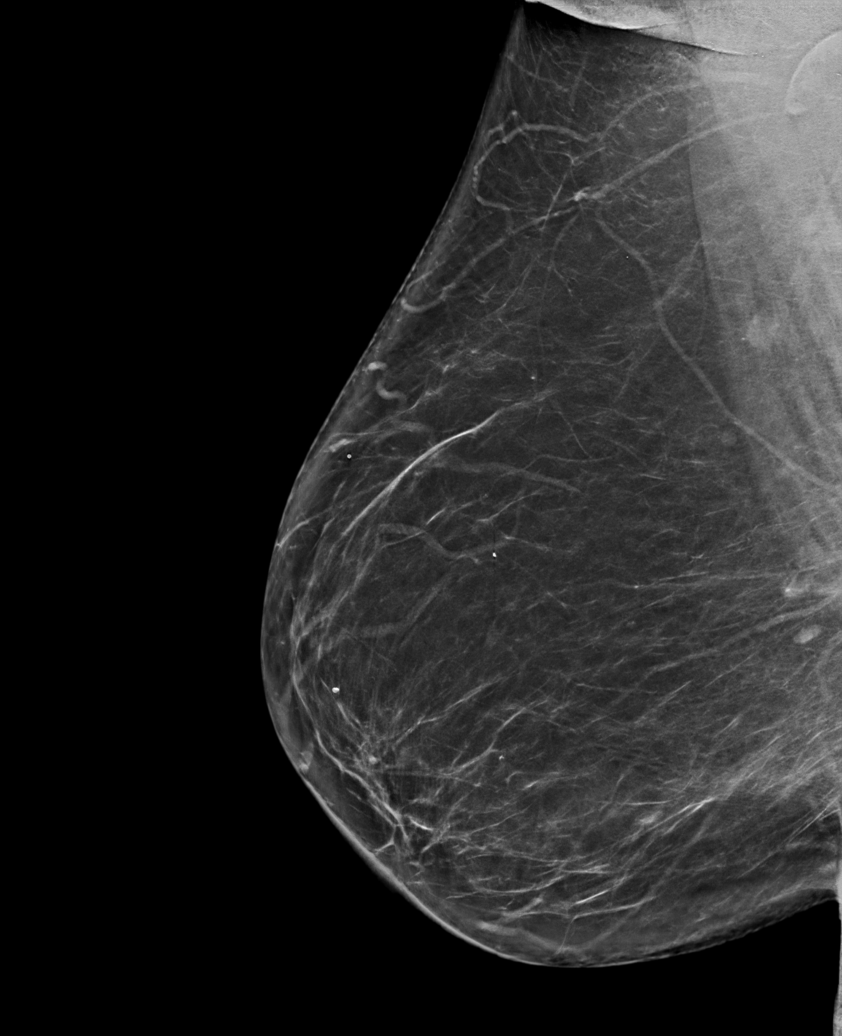

[L MLO tomo · tomo slice 50/99.0]
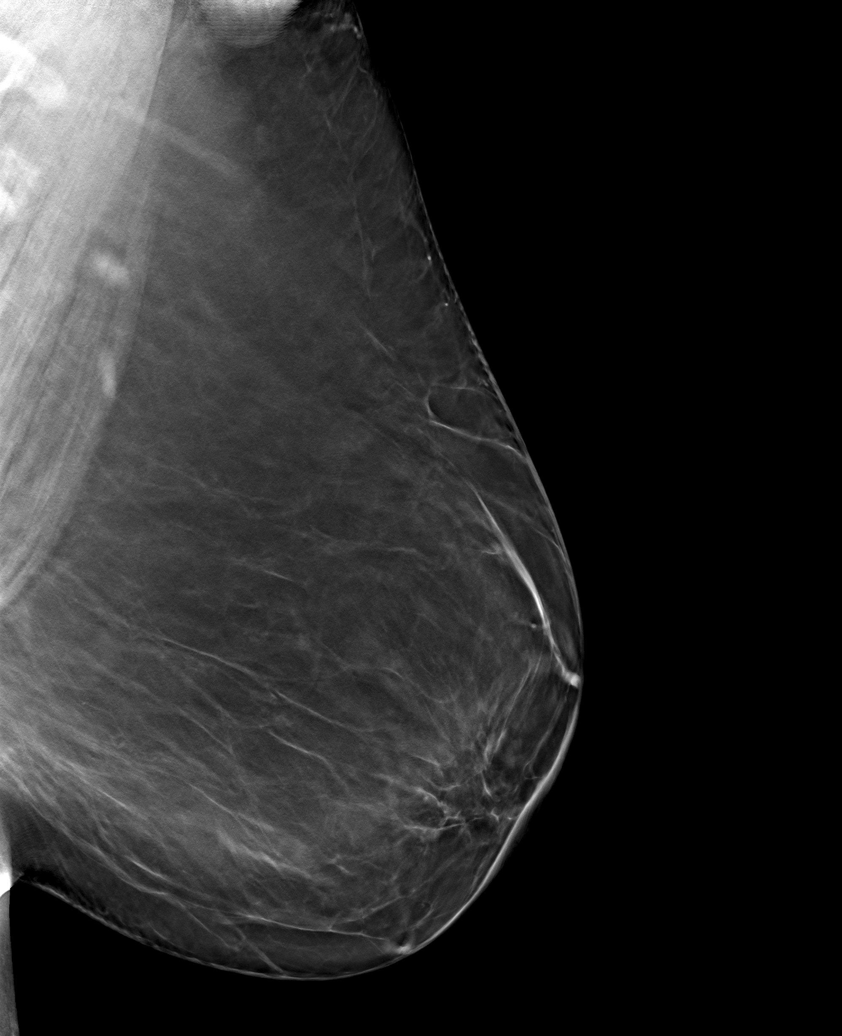

[6 of 30 positions shown; findings below may reference images not displayed]

FINDINGS: There are no findings suspicious for malignancy.
IMPRESSION: No mammographic evidence of malignancy. A result letter of this
screening mammogram will be mailed directly to the patient.

RECOMMENDATION:
Screening mammogram in one year. (Code:0E-3-N98)

BI-RADS CATEGORY  1: Negative.

## 2023-02-13 ENCOUNTER — Encounter (HOSPITAL_COMMUNITY): Payer: Self-pay

## 2023-02-13 ENCOUNTER — Ambulatory Visit (HOSPITAL_COMMUNITY)
Admission: RE | Admit: 2023-02-13 | Discharge: 2023-02-13 | Disposition: A | Discharge status: Home / Self Care | Payer: 59 | Source: Ambulatory Visit | Attending: Psychiatry | Admitting: Psychiatry

## 2023-02-13 DIAGNOSIS — C541 Malignant neoplasm of endometrium: Secondary | ICD-10-CM | POA: Diagnosis present

## 2023-02-13 MED ORDER — IOHEXOL 300 MG/ML  SOLN
30.0000 mL | Freq: Once | INTRAMUSCULAR | Status: AC | PRN
  Administered 2023-02-13: 30 mL via ORAL

## 2023-02-13 MED ORDER — IOHEXOL 300 MG/ML  SOLN
100.0000 mL | Freq: Once | INTRAMUSCULAR | Status: AC | PRN
  Administered 2023-02-13: 100 mL via INTRAVENOUS

## 2023-02-20 ENCOUNTER — Ambulatory Visit: Payer: 59 | Admitting: Psychiatry

## 2023-02-27 ENCOUNTER — Encounter: Payer: Self-pay | Admitting: Psychiatry

## 2023-02-27 ENCOUNTER — Inpatient Hospital Stay: Payer: 59 | Attending: Psychiatry | Admitting: Psychiatry

## 2023-02-27 VITALS — BP 134/56 | HR 60 | Temp 97.8°F | Resp 19 | Wt 340.2 lb

## 2023-02-27 DIAGNOSIS — Z90722 Acquired absence of ovaries, bilateral: Secondary | ICD-10-CM | POA: Diagnosis not present

## 2023-02-27 DIAGNOSIS — Z90710 Acquired absence of both cervix and uterus: Secondary | ICD-10-CM | POA: Insufficient documentation

## 2023-02-27 DIAGNOSIS — Z9079 Acquired absence of other genital organ(s): Secondary | ICD-10-CM | POA: Insufficient documentation

## 2023-02-27 DIAGNOSIS — Z8542 Personal history of malignant neoplasm of other parts of uterus: Secondary | ICD-10-CM | POA: Insufficient documentation

## 2023-02-27 DIAGNOSIS — C541 Malignant neoplasm of endometrium: Secondary | ICD-10-CM

## 2023-02-27 NOTE — Progress Notes (Addendum)
Gynecologic Oncology Return Clinic Visit  Date of Service: 02/27/2023 Referring Provider: Nicholaus Bloom, MD   Assessment & Plan: Joan Graves is a 52 y.o. woman with incompletely staged Stage IB (FIGO 2023 staging) FIGO grade 2 endometrioid endometrial cancer (no LVSI, 64% MI, p53wt, MMRp, POLE neg), s/p RA-TLH, BSO on 04/12/22 and completed VBT 07/11/22. Presents today for surveillance.  Endometrial cancer: - NED on exam today. - CT from 02/13/23 reviewed and NED. - Signs/symptoms of recurrence reviewed. - Continue surveillance with follow-up q43mo initially with Gyn Onc. Pt prefers to defer Rad Onc follow-up unless particular need. After 1 year NED, can consider spacing to q41mo for surveillance (pt prefers this).  - Reviewed that after 5 years NED, will be safe to return to Ob/Gyn. - POLE testing negative. - Pt declined genetics referral.  - Given unable to surgically assess lymph nodes, recommend repeat CT in 67mo for surveillance.    RTC 3 months.  Clide Cliff, MD Gynecologic Oncology  Medical Decision Making I personally spent  TOTAL 20 minutes face-to-face and non-face-to-face in the care of this patient, which includes all pre, intra, and post visit time on the date of service.   ----------------------- Reason for Visit: Surveillance  Treatment History: Oncology History  Endometrial cancer (HCC)  02/15/2022 Initial Biopsy   Endometrial biopsy: FIGO grade 2 endometrioid adenocarcinoma   03/26/2022 Initial Diagnosis   Endometrial cancer (HCC)   04/12/2022 Surgery   Robotic-assisted total laparoscopic hysterectomy, bilateral salpingo-oophorectomy, bilateral sentinel lymph node evaluation (no biopsy due to morbid obesity and inability to visualize lymph channels/sentinel lymph node)   04/12/2022 Pathology Results   A. UTERUS WITH RIGHT AND LEFT FALLOPIAN TUBE AND OVARY, HYSTERECTOMY AND  BILATERAL SALPINGO-OOPHORECTOMY:  Invasive well to moderately differentiated  endometrioid adenocarcinoma  with squamous morular metaplasia, FIGO 2  Tumor invades greater than 50% of the myometrium within the lower  uterine segment (7 of 11 mm, 64%) (pT1b)  Margins free  Bilateral acute and chronic salpingitis, proximally  Benign ovaries   An immunohistochemical stain for p53 performed on the biopsy  ([id number]) exhibited a wild-type staining pattern.   IHC EXPRESSION RESULTS  TEST           RESULT  MLH1:          Preserved nuclear expression  MSH2:          Preserved nuclear expression  MSH6:          Preserved nuclear expression  PMS2:          Preserved nuclear expression    04/12/2022 Cancer Staging   Staging form: Corpus Uteri - Carcinoma and Carcinosarcoma, AJCC 8th Edition - Pathologic stage from 04/12/2022: FIGO Stage IB, calculated as Stage Unknown (pT1b, pNX, cM0) - Signed by Clide Cliff, MD on 03/26/2023 Histopathologic type: Endometrioid adenocarcinoma, NOS Stage prefix: Initial diagnosis Histologic grade (G): G2 Histologic grading system: 3 grade system Lymph-vascular invasion (LVI): LVI not present (absent)/not identified     Interval History: Patient reports that she has been experiencing some chest pain and shortness of breath for which she has been referred to cardiology, pulmonology and vascular.  She reports having undergone an echo and PVLs.  She is also worn a heart monitor with follow-up for that upcoming.  She otherwise denies any new vaginal bleeding, abdominal/pelvic pain, unintentional weight loss, change in bowel or bladder habits, early satiety, bloating, nausea/vomiting.  Reports that skin rashes have resolved.   Past Medical/Surgical History: Past Medical History:  Diagnosis Date  Anemia    Anxiety    Aortic valve stenosis    Arthritis    Asthma    Bradycardia    Cancer (HCC)    endometrial cancer   Chronic pulmonary hypertension (HCC)    Depression    Diabetes mellitus without complication (HCC)    Dysrhythmia     Endometrial cancer (HCC)    GERD (gastroesophageal reflux disease)    Heart murmur    History of radiation therapy    Endometrial- HDR- 06/13/22-07/11/22- Dr. Antony Blackbird   Hypercholesteremia    Hypertension    Paroxysmal atrial fibrillation (HCC)    Paroxysmal atrial fibrillation (HCC)    Sleep apnea     Past Surgical History:  Procedure Laterality Date   CARDIOVERSION N/A 12/30/2020   Procedure: CARDIOVERSION;  Surgeon: Lamar Blinks, MD;  Location: [facility] ORS;  Service: Cardiovascular;  Laterality: N/A;   CARDIOVERSION N/A 08/24/2022   Procedure: CARDIOVERSION;  Surgeon: Alwyn Pea, MD;  Location: [facility];  Service: Cardiovascular;  Laterality: N/A;   CATARACT EXTRACTION Right 11/2021   CATARACT EXTRACTION Left 12/2021   COLONOSCOPY     COLONOSCOPY WITH PROPOFOL N/A 08/18/2022   Procedure: COLONOSCOPY WITH PROPOFOL;  Surgeon: Jaynie Collins, DO;  Location: [facility] ENDOSCOPY;  Service: Gastroenterology;  Laterality: N/A;  IDDM   ESOPHAGOGASTRODUODENOSCOPY (EGD) WITH PROPOFOL N/A 06/29/2021   Procedure: ESOPHAGOGASTRODUODENOSCOPY (EGD) WITH PROPOFOL;  Surgeon: Midge Minium, MD;  Location: [facility] ENDOSCOPY;  Service: Endoscopy;  Laterality: N/A;   INTERSTIM IMPLANT PLACEMENT N/A 10/18/2022   Procedure: Leane Platt IMPLANT SECOND STAGE;  Surgeon: Alfredo Martinez, MD;  Location: [facility];  Service: Urology;  Laterality: N/A;  NEEDS TO BE INTUBATED WITH NO MUSCLE RELAXANT   NEEDS 75 MINS FOR THIS CASE   INTERSTIM IMPLANT PLACEMENT N/A 10/18/2022   Procedure: Leane Platt IMPLANT FIRST STAGE;  Surgeon: Alfredo Martinez, MD;  Location: [facility];  Service: Urology;  Laterality: N/A;   POLYPECTOMY  08/18/2022   Procedure: POLYPECTOMY;  Surgeon: Jaynie Collins, DO;  Location: [facility];  Service: Gastroenterology;;   ROBOTIC ASSISTED TOTAL HYSTERECTOMY WITH BILATERAL SALPINGO OOPHERECTOMY Bilateral 04/12/2022   Procedure: XI ROBOTIC ASSISTED TOTAL HYSTERECTOMY WITH  BILATERAL SALPINGO OOPHORECTOMY;  Surgeon: Clide Cliff, MD;  Location: [facility];  Service: Gynecology;  Laterality: Bilateral;   SENTINEL NODE BIOPSY N/A 04/12/2022   Procedure: SENTINEL NODE INJECTION;  Surgeon: Clide Cliff, MD;  Location: [facility];  Service: Gynecology;  Laterality: N/A;   TUBAL LIGATION  2000    Family History  Problem Relation Age of Onset   Ovarian cancer Mother        mid 55s   Lung cancer Mother 37   Throat cancer Maternal Grandmother    Colon cancer Neg Hx    Breast cancer Neg Hx    Endometrial cancer Neg Hx    Pancreatic cancer Neg Hx    Prostate cancer Neg Hx     Social History   Socioeconomic History   Marital status: Married    Spouse name: Not on file   Number of children: Not on file   Years of education: Not on file   Highest education level: Not on file  Occupational History   Not on file  Tobacco Use   Smoking status: Former    Types: Cigarettes    Passive exposure: Past   Smokeless tobacco: Never  Vaping Use   Vaping status: Every Day   Substances: Nicotine, Flavoring  Substance and Sexual Activity   Alcohol use:  Yes    Alcohol/week: 2.0 standard drinks of alcohol    Types: 1 Glasses of wine, 1 Shots of liquor per week    Comment: occ   Drug use: Never   Sexual activity: Yes    Birth control/protection: Surgical    Comment: Tubal Ligation  Other Topics Concern   Not on file  Social History Narrative   Lives with husband, Lyman Bishop.  Also lives with ex-husband.    Social Drivers of Corporate investment banker Strain: Not on file  Food Insecurity: Patient Declined (03/26/2022)   Hunger Vital Sign    Worried About Running Out of Food in the Last Year: Patient declined    Ran Out of Food in the Last Year: Patient declined  Transportation Needs: Not on file  Physical Activity: Not on file  Stress: Not on file  Social Connections: Not on file    Current Medications:  Current Outpatient Medications:    ACCU-CHEK  SOFTCLIX LANCETS lancets, CHECK GLUCOSE TWICE DIALY FOR GLUCOSE MONITORING E11.9, Disp: , Rfl: 5   ADVAIR DISKUS 500-50 MCG/DOSE AEPB, Inhale 2 puffs into the lungs daily., Disp: , Rfl: 5   amiodarone (PACERONE) 200 MG tablet, Take 400 mg by mouth daily., Disp: , Rfl:    amLODipine (NORVASC) 10 MG tablet, Take 1 tablet by mouth daily., Disp: , Rfl:    apixaban (ELIQUIS) 5 MG TABS tablet, Take 10 mg by mouth daily., Disp: , Rfl:    atorvastatin (LIPITOR) 20 MG tablet, Take 20 mg by mouth daily., Disp: , Rfl:    buPROPion (WELLBUTRIN SR) 100 MG 12 hr tablet, Take 200 mg by mouth daily., Disp: , Rfl:    cephALEXin (KEFLEX) 500 MG capsule, Take 1 capsule (500 mg total) by mouth 4 (four) times daily., Disp: 28 capsule, Rfl: 0   doxycycline (VIBRAMYCIN) 100 MG capsule, Take 1 capsule (100 mg total) by mouth 2 (two) times daily., Disp: 20 capsule, Rfl: 0   enalapril (VASOTEC) 20 MG tablet, Take 20 mg by mouth daily., Disp: , Rfl:    ferrous sulfate 325 (65 FE) MG EC tablet, Take 650 mg by mouth daily with breakfast., Disp: , Rfl:    furosemide (LASIX) 20 MG tablet, Take 20 mg by mouth daily., Disp: , Rfl:    glipiZIDE (GLUCOTROL XL) 10 MG 24 hr tablet, Take 10 mg by mouth daily., Disp: , Rfl:    glucose blood test strip, USE AS DIRECTED TWICE A DAY, Disp: , Rfl:    hydrochlorothiazide (MICROZIDE) 12.5 MG capsule, Take 12.5 mg by mouth daily., Disp: , Rfl:    HYDROcodone-acetaminophen (NORCO) 5-325 MG tablet, Take 1-2 tablets by mouth every 6 (six) hours as needed for moderate pain., Disp: 14 tablet, Rfl: 0   Insulin Glargine (BASAGLAR KWIKPEN) 100 UNIT/ML, Inject 20 Units into the skin at bedtime as needed (if CBG over 200)., Disp: , Rfl:    JARDIANCE 25 MG TABS tablet, Take 25 mg by mouth daily., Disp: , Rfl:    loratadine (CLARITIN) 10 MG tablet, Take 10 mg by mouth daily., Disp: , Rfl:    metFORMIN (GLUCOPHAGE-XR) 500 MG 24 hr tablet, Take 2,000 mg by mouth daily., Disp: , Rfl: 1   metoprolol  tartrate (LOPRESSOR) 25 MG tablet, Take 37.5 mg by mouth daily., Disp: , Rfl:    naproxen sodium (ALEVE) 220 MG tablet, Take 220 mg by mouth daily as needed (headaches)., Disp: , Rfl:    omeprazole (PRILOSEC) 40 MG capsule, Take 40 mg  by mouth daily., Disp: , Rfl:   Review of Symptoms: Complete 10-system review is positive for: Wheezing, chest pain, swelling of legs, anxiety, depression, knee joint pain  Physical Exam: BP (!) 134/56 (BP Location: Left Arm, Patient Position: Sitting) Comment: Notified RN  Pulse 60   Temp 97.8 F (36.6 C) (Oral)   Resp 19   Wt (!) 340 lb 3.2 oz (154.3 kg)   LMP 10/06/2017 (Approximate) Comment: Pt did not have period for approx 4 years then started periods again this spring, irregular. neg preg test  SpO2 98%   BMI 60.26 kg/m  General: Alert, oriented, no acute distress. HEENT: Normocephalic, atraumatic. Neck symmetric without masses. Sclera anicteric.  Chest: Normal work of breathing. Clear to auscultation bilaterally.   Cardiovascular: Regular rate and rhythm, no murmurs. Abdomen: Soft, nontender.  Normoactive bowel sounds.   Robotic incisions healed.   Extremities: Grossly normal range of motion.  Warm, well perfused. Chronic skin changes over both lower extremities Skin: No skin lesions, resolution of erythema of groins, vulva Lymphatics: No cervical, supraclavicular, or inguinal lymphadenopathy GU: Normal external genitalia.  Speculum exam with normal vaginal cuff with 2 mm area of granulation tissue treated with silver nitrate.  Bimanual exam with smooth vaginal cuff, no nodularity, no pelvic mass, exam limited by body habitus. Exam chaperoned by Hector Shade, NT    Laboratory & Radiologic Studies: CT Abdomen Pelvis W Contrast 02/13/2023  Narrative CLINICAL DATA:  Genital cancer, monitor. Endometrial cancer. * Tracking Code: BO *  EXAM: CT ABDOMEN AND PELVIS WITH CONTRAST  TECHNIQUE: Multidetector CT imaging of the abdomen and pelvis  was performed using the standard protocol following bolus administration of intravenous contrast.  RADIATION DOSE REDUCTION: This exam was performed according to the departmental dose-optimization program which includes automated exposure control, adjustment of the mA and/or kV according to patient size and/or use of iterative reconstruction technique.  CONTRAST:  OMNIPAQUE IOHEXOL 300 MG/ML  SOLN  COMPARISON:  Multiple priors including CT May 31, 2022  FINDINGS: Lower chest: No acute abnormality.  Hepatobiliary: Hepatomegaly with diffuse hepatic steatosis. Gallbladder is unremarkable. No biliary ductal dilation.  Pancreas: Pancreatic ductal dilation or evidence of acute inflammation.  Spleen: No splenomegaly.  Adrenals/Urinary Tract: Bilateral adrenal glands appear normal. No hydronephrosis. Urinary bladder is nondistended limiting evaluation.  Stomach/Bowel: Radiopaque enteric contrast material traverses the descending colon. Stomach is minimally distended limiting evaluation. No pathologic dilation of small or large bowel. No evidence of acute bowel inflammation.  Vascular/Lymphatic: Normal caliber abdominal aorta. Smooth IVC contours. The portal, splenic and superior mesenteric veins are patent. No pathologically enlarged abdominal or pelvic lymph nodes.  Reproductive: Status post hysterectomy no suspicious enhancing soft tissue nodularity along the vaginal cuff. No discrete adnexal masses.  Other: No significant abdominopelvic free fluid. Small fat containing ventral hernia.  Musculoskeletal: Right posterior lumbar stimulating generator with lead anterior to the sacrum. No aggressive lytic or blastic lesion of bone. Chronic bilateral L5 pars defects with minimal grade 1 anterolisthesis.  IMPRESSION: 1. Status post hysterectomy without evidence of recurrent or metastatic disease in the abdomen or pelvis. 2. Hepatomegaly with diffuse hepatic  steatosis.   Electronically Signed By: Maudry Mayhew M.D. On: 02/18/2023 09:48

## 2023-02-27 NOTE — Patient Instructions (Signed)
It was a pleasure to see you in clinic today. - Normal exam today - Return visit planned for 3 months  Thank you very much for allowing me to provide care for you today.  I appreciate your confidence in choosing our Gynecologic Oncology team at [facility].  If you have any questions about your visit today please call our office or send [place] a MyChart message and we will get back to you as soon as possible.

## 2023-03-09 ENCOUNTER — Ambulatory Visit (INDEPENDENT_AMBULATORY_CARE_PROVIDER_SITE_OTHER): Payer: 59 | Admitting: Nurse Practitioner

## 2023-03-09 ENCOUNTER — Encounter (INDEPENDENT_AMBULATORY_CARE_PROVIDER_SITE_OTHER): Payer: Self-pay | Admitting: Nurse Practitioner

## 2023-03-09 VITALS — BP 138/72 | HR 58 | Resp 16 | Ht 63.0 in | Wt 331.0 lb

## 2023-03-09 DIAGNOSIS — M7989 Other specified soft tissue disorders: Secondary | ICD-10-CM | POA: Diagnosis not present

## 2023-03-09 DIAGNOSIS — L03119 Cellulitis of unspecified part of limb: Secondary | ICD-10-CM

## 2023-03-09 DIAGNOSIS — L02419 Cutaneous abscess of limb, unspecified: Secondary | ICD-10-CM

## 2023-03-09 DIAGNOSIS — M79671 Pain in right foot: Secondary | ICD-10-CM | POA: Diagnosis not present

## 2023-03-09 DIAGNOSIS — I10 Essential (primary) hypertension: Secondary | ICD-10-CM

## 2023-03-09 DIAGNOSIS — M79672 Pain in left foot: Secondary | ICD-10-CM

## 2023-03-09 MED ORDER — CEPHALEXIN 500 MG PO CAPS: 500.0000 mg | ORAL_CAPSULE | Freq: Four times a day (QID) | ORAL | 0 refills | Status: DC

## 2023-03-10 ENCOUNTER — Encounter (INDEPENDENT_AMBULATORY_CARE_PROVIDER_SITE_OTHER): Payer: Self-pay | Admitting: Nurse Practitioner

## 2023-03-10 NOTE — Progress Notes (Signed)
 Subjective:    Patient ID: Joan Graves, female    DOB: Dec 19, 1970, 53 y.o.   MRN: [id number] Chief Complaint  Patient presents with   New Patient (Initial Visit)    Ref Elodie consult ble edema/pain    The patient is a 53 year old female who presents today for evaluation of bilateral lower extremity edema and pain.  The patient notes that the right leg seems to be worse but she has swelling in her bilateral lower extremities.  The swelling has been worse since her recent emergency room visit this summer.  She was to return to the emergency room due to cellulitis and stasis dermatitis of the right lower extremity.  This was on 12/29/2022 and they ruled out right lower extremity DVT.  This was successfully treated with Keflex .  She also endorses having pain in her feet.  She could not quite describe the consistency of the pain or the exact location.  Currently there are no open wounds or ulcerations.    Review of Systems  Cardiovascular:  Positive for leg swelling.  Musculoskeletal:  Positive for gait problem.  All other systems reviewed and are negative.      Objective:   Physical Exam Vitals reviewed.  HENT:     Head: Normocephalic.  Cardiovascular:     Rate and Rhythm: Normal rate.  Pulmonary:     Effort: Pulmonary effort is normal.  Musculoskeletal:     Right lower leg: Edema present.     Left lower leg: Edema present.  Skin:    General: Skin is warm and dry.  Neurological:     Mental Status: She is alert and oriented to person, place, and time.  Psychiatric:        Mood and Affect: Mood normal.        Behavior: Behavior normal.        Thought Content: Thought content normal.        Judgment: Judgment normal.     BP 138/72   Pulse (!) 58   Resp 16   Ht 5' 3 (1.6 m)   Wt (!) 331 lb (150.1 kg)   LMP 10/06/2017 (Approximate) Comment: Pt did not have period for approx 4 years then started periods again this spring, irregular. neg preg test  BMI 58.63  kg/m   Past Medical History:  Diagnosis Date   Anemia    Anxiety    Aortic valve stenosis    Arthritis    Asthma    Bradycardia    Cancer (HCC)    endometrial cancer   Chronic pulmonary hypertension (HCC)    Depression    Diabetes mellitus without complication (HCC)    Dysrhythmia    Endometrial cancer (HCC)    GERD (gastroesophageal reflux disease)    Heart murmur    History of radiation therapy    Endometrial- HDR- 06/13/22-07/11/22- Dr. Lynwood Nasuti   Hypercholesteremia    Hypertension    Paroxysmal atrial fibrillation (HCC)    Paroxysmal atrial fibrillation (HCC)    Sleep apnea     Social History   Socioeconomic History   Marital status: Married    Spouse name: Not on file   Number of children: Not on file   Years of education: Not on file   Highest education level: Not on file  Occupational History   Not on file  Tobacco Use   Smoking status: Former    Types: Cigarettes    Passive exposure: Past   Smokeless tobacco: Never  Vaping Use   Vaping status: Every Day   Substances: Nicotine, Flavoring  Substance and Sexual Activity   Alcohol use: Yes    Alcohol/week: 2.0 standard drinks of alcohol    Types: 1 Glasses of wine, 1 Shots of liquor per week    Comment: occ   Drug use: Never   Sexual activity: Yes    Birth control/protection: Surgical    Comment: Tubal Ligation  Other Topics Concern   Not on file  Social History Narrative   Lives with husband, Jerilynn.  Also lives with ex-husband.    Social Drivers of Corporate Investment Banker Strain: Not on file  Food Insecurity: Patient Declined (03/26/2022)   Hunger Vital Sign    Worried About Running Out of Food in the Last Year: Patient declined    Ran Out of Food in the Last Year: Patient declined  Transportation Needs: Not on file  Physical Activity: Not on file  Stress: Not on file  Social Connections: Not on file  Intimate Partner Violence: Not on file    Past Surgical History:  Procedure  Laterality Date   CARDIOVERSION N/A 12/30/2020   Procedure: CARDIOVERSION;  Surgeon: Hester Wolm PARAS, MD;  Location: [facility] ORS;  Service: Cardiovascular;  Laterality: N/A;   CARDIOVERSION N/A 08/24/2022   Procedure: CARDIOVERSION;  Surgeon: Florencio Cara BIRCH, MD;  Location: [facility] OR[facility];  Service: Cardiovascular;  Laterality: N/A;   CATARACT EXTRACTION Right 11/2021   CATARACT EXTRACTION Left 12/2021   COLONOSCOPY     COLONOSCOPY WITH PROPOFOL  N/A 08/18/2022   Procedure: COLONOSCOPY WITH PROPOFOL ;  Surgeon: Onita Elspeth Sharper, DO;  Location: [facility] ENDOSCOPY;  Service: Gastroenterology;  Laterality: N/A;  IDDM   ESOPHAGOGASTRODUODENOSCOPY (EGD) WITH PROPOFOL  N/A 06/29/2021   Procedure: ESOPHAGOGASTRODUODENOSCOPY (EGD) WITH PROPOFOL ;  Surgeon: Jinny Carmine, MD;  Location: [facility] ENDOSCOPY;  Service: Endoscopy;  Laterality: N/A;   INTERSTIM IMPLANT PLACEMENT N/A 10/18/2022   Procedure: RENNA IMPLANT SECOND STAGE;  Surgeon: Gaston Hamilton, MD;  Location: [facility];  Service: Urology;  Laterality: N/A;  NEEDS TO BE INTUBATED WITH NO MUSCLE RELAXANT   NEEDS 75 MINS FOR THIS CASE   INTERSTIM IMPLANT PLACEMENT N/A 10/18/2022   Procedure: RENNA IMPLANT FIRST STAGE;  Surgeon: Gaston Hamilton, MD;  Location: [facility];  Service: Urology;  Laterality: N/A;   POLYPECTOMY  08/18/2022   Procedure: POLYPECTOMY;  Surgeon: Onita Elspeth Sharper, DO;  Location: [facility]COPY;  Service: Gastroenterology;;   ROBOTIC ASSISTED TOTAL HYSTERECTOMY WITH BILATERAL SALPINGO OOPHERECTOMY Bilateral 04/12/2022   Procedure: XI ROBOTIC ASSISTED TOTAL HYSTERECTOMY WITH BILATERAL SALPINGO OOPHORECTOMY;  Surgeon: Eldonna Mays, MD;  Location: [facility];  Service: Gynecology;  Laterality: Bilateral;   SENTINEL NODE BIOPSY N/A 04/12/2022   Procedure: SENTINEL NODE INJECTION;  Surgeon: Eldonna Mays, MD;  Location: [facility];  Service: Gynecology;  Laterality: N/A;   TUBAL LIGATION  2000    Family History  Problem Relation Age  of Onset   Ovarian cancer Mother        mid 21s   Lung cancer Mother 18   Throat cancer Maternal Grandmother    Colon cancer Neg Hx    Breast cancer Neg Hx    Endometrial cancer Neg Hx    Pancreatic cancer Neg Hx    Prostate cancer Neg Hx     Allergies  Allergen Reactions   Aspirin Diarrhea    high doses gives her diarrhea       Latest Ref Rng & Units 11/07/2022   10:05 AM 10/18/2022  8:05 AM 08/30/2022    1:34 PM  CBC  WBC 4.0 - 10.5 K/uL 4.0  6.4  6.3   Hemoglobin 12.0 - 15.0 g/dL 88.0  88.3  87.1   Hematocrit 36.0 - 46.0 % 33.0  38.1  34.7   Platelets 150 - 400 K/uL 213  190  195       CMP     Component Value Date/Time   NA 136 11/07/2022 1005   K 3.9 11/07/2022 1005   CL 99 11/07/2022 1005   CO2 24 11/07/2022 1005   GLUCOSE 212 (H) 11/07/2022 1005   BUN 23 (H) 11/07/2022 1005   CREATININE 0.97 11/07/2022 1005   CREATININE 0.69 03/02/2022 1215   CALCIUM  8.8 (L) 11/07/2022 1005   PROT 8.0 05/30/2022 2303   ALBUMIN 3.7 05/30/2022 2303   AST 25 05/30/2022 2303   AST 15 03/02/2022 1215   ALT 26 05/30/2022 2303   ALT 17 03/02/2022 1215   ALKPHOS 113 05/30/2022 2303   BILITOT 0.6 05/30/2022 2303   BILITOT 0.4 03/02/2022 1215   GFRNONAA >60 11/07/2022 1005   GFRNONAA >60 03/02/2022 1215     No results found.     Assessment & Plan:    1. Leg swelling (Primary) Recommend:  I have had a long discussion with the patient regarding swelling and why it  causes symptoms.  The patient is advised to try to utilize medical grade compression socks.  The patient will  wear the stockings first thing in the morning and removing them in the evening. The patient is instructed specifically not to sleep in the stockings.   In addition, behavioral modification will be initiated.  This will include frequent elevation, use of over the counter pain medications and exercise such as walking.  Consideration for a lymph pump will also be made based upon the effectiveness of  conservative therapy.  This would help to improve the edema control and prevent sequela such as ulcers and infections   Patient should undergo duplex ultrasound of the venous system to ensure that DVT or reflux is not present.  The patient will follow-up with me after the ultrasound.   2. Cellulitis and abscess of leg She had a small area of boil on the upper leg.  She notes that these typically resulted in cellulitis for her.  This in turn Keflex  which has worked well for her.  She is advised that if it worsens or does not improve she should follow-up with PCP for further evaluation  3. Benign essential hypertension Continue antihypertensive medications as already ordered, these medications have been reviewed and there are no changes at this time.  4. Pain in both feet In regards to the pain in her feet the patient is a somewhat poor historian.  She cannot really describe the pain fully but I have a suspicion it may be neuropathy however given her difficulty describing the pain and issue, I feel would be best to have the patient evaluated by podiatry   Current Outpatient Medications on File Prior to Visit  Medication Sig Dispense Refill   ACCU-CHEK SOFTCLIX LANCETS lancets CHECK GLUCOSE TWICE DIALY FOR GLUCOSE MONITORING E11.9  5   ADVAIR DISKUS 500-50 MCG/DOSE AEPB Inhale 2 puffs into the lungs daily.  5   amiodarone (PACERONE) 200 MG tablet Take 400 mg by mouth daily.     amLODipine  (NORVASC ) 10 MG tablet Take 1 tablet by mouth daily.     apixaban  (ELIQUIS ) 5 MG TABS tablet Take 10  mg by mouth daily.     atorvastatin  (LIPITOR) 20 MG tablet Take 20 mg by mouth daily.     buPROPion  (WELLBUTRIN  SR) 100 MG 12 hr tablet Take 200 mg by mouth daily.     doxycycline  (VIBRAMYCIN ) 100 MG capsule Take 1 capsule (100 mg total) by mouth 2 (two) times daily. 20 capsule 0   enalapril  (VASOTEC ) 20 MG tablet Take 20 mg by mouth daily.     ferrous sulfate 325 (65 FE) MG EC tablet Take 650 mg by mouth  daily with breakfast.     furosemide (LASIX) 20 MG tablet Take 20 mg by mouth daily.     glipiZIDE (GLUCOTROL XL) 10 MG 24 hr tablet Take 10 mg by mouth daily.     glucose blood test strip USE AS DIRECTED TWICE A DAY     hydrochlorothiazide (MICROZIDE) 12.5 MG capsule Take 12.5 mg by mouth daily.     HYDROcodone -acetaminophen  (NORCO) 5-325 MG tablet Take 1-2 tablets by mouth every 6 (six) hours as needed for moderate pain. 14 tablet 0   Insulin  Glargine (BASAGLAR  KWIKPEN) 100 UNIT/ML Inject 20 Units into the skin at bedtime as needed (if CBG over 200).     JARDIANCE 25 MG TABS tablet Take 25 mg by mouth daily.     loratadine (CLARITIN) 10 MG tablet Take 10 mg by mouth daily.     metFORMIN (GLUCOPHAGE-XR) 500 MG 24 hr tablet Take 2,000 mg by mouth daily.  1   metoprolol  tartrate (LOPRESSOR ) 25 MG tablet Take 37.5 mg by mouth daily.     naproxen sodium (ALEVE) 220 MG tablet Take 220 mg by mouth daily as needed (headaches).     omeprazole (PRILOSEC) 40 MG capsule Take 40 mg by mouth daily.     No current facility-administered medications on file prior to visit.    There are no Patient Instructions on file for this visit. No follow-ups on file.   Kerri-Anne Haeberle E Ileanna Gemmill, NP

## 2023-03-25 ENCOUNTER — Ambulatory Visit
Admission: EM | Admit: 2023-03-25 | Discharge: 2023-03-25 | Disposition: A | Discharge status: Home / Self Care | Payer: 59 | Attending: Family Medicine | Admitting: Family Medicine

## 2023-03-25 ENCOUNTER — Other Ambulatory Visit (INDEPENDENT_AMBULATORY_CARE_PROVIDER_SITE_OTHER): Payer: Self-pay | Admitting: Nurse Practitioner

## 2023-03-25 DIAGNOSIS — L02214 Cutaneous abscess of groin: Secondary | ICD-10-CM

## 2023-03-25 MED ORDER — DOXYCYCLINE HYCLATE 100 MG PO CAPS: 100.0000 mg | ORAL_CAPSULE | Freq: Two times a day (BID) | ORAL | 0 refills | Status: DC

## 2023-03-25 NOTE — ED Triage Notes (Addendum)
Pt c/o bump on vagina x1 day. Denies any drainage or fevers. States tender to touch.

## 2023-03-25 NOTE — ED Provider Notes (Signed)
[facility]    CSN: [id number] Arrival date & time: 03/25/23  1035      History   Chief Complaint Chief Complaint  Patient presents with   Vaginal Problem     HPI HPI KERSTYN CORYELL is a 53 y.o. female.    Levon Penning Griffin-Harris presents for bump on her vaginal that she noticed when she took a shower.  Had similar symptoms in the past but needed antibiotics and at 1 time needed to have it drained on her legs.  Tried nothing for her symptoms prior to arrival.  No fever, vaginal discharge, vaginal odor, dysuria.    Past Medical History:  Diagnosis Date   Anemia    Anxiety    Aortic valve stenosis    Arthritis    Asthma    Bradycardia    Cancer (HCC)    endometrial cancer   Chronic pulmonary hypertension (HCC)    Depression    Diabetes mellitus without complication (HCC)    Dysrhythmia    Endometrial cancer (HCC)    GERD (gastroesophageal reflux disease)    Heart murmur    History of radiation therapy    Endometrial- HDR- 06/13/22-07/11/22- Dr. Antony Blackbird   Hypercholesteremia    Hypertension    Paroxysmal atrial fibrillation (HCC)    Paroxysmal atrial fibrillation [facility])    Sleep apnea     Patient Active Problem List   Diagnosis Date Noted   Cellulitis of nose 03/26/2022   Endometrial cancer (HCC) 03/26/2022   Diabetes mellitus without complication (HCC) 03/26/2022   Morbid obesity with BMI of 50.0-59.9, adult (HCC) 03/26/2022   HLD (hyperlipidemia) 03/26/2022   Depression with anxiety 03/26/2022   Abscess of nose 03/26/2022   Family history of ovarian cancer 02/03/2022   PMB (postmenopausal bleeding) 02/03/2022   Esophageal dysphagia    Problems with swallowing and mastication    Paroxysmal atrial fibrillation (HCC) 12/09/2020   Chronic pulmonary hypertension (HCC) 06/11/2020   Moderate aortic valve stenosis 06/11/2020   Benign essential hypertension 12/02/2014   Hyperlipidemia, mixed 12/11/2013   Obstructive sleep apnea  12/11/2013   Asthma 12/09/2013   Diabetes (HCC) 12/09/2013   Gastroesophageal reflux disease 12/09/2013    Past Surgical History:  Procedure Laterality Date   CARDIOVERSION N/A 12/30/2020   Procedure: CARDIOVERSION;  Surgeon: Lamar Blinks, MD;  Location: [facility] ORS;  Service: Cardiovascular;  Laterality: N/A;   CARDIOVERSION N/A 08/24/2022   Procedure: CARDIOVERSION;  Surgeon: Alwyn Pea, MD;  Location: [facility] ORS;  Service: Cardiovascular;  Laterality: N/A;   CATARACT EXTRACTION Right 11/2021   CATARACT EXTRACTION Left 12/2021   COLONOSCOPY     COLONOSCOPY WITH PROPOFOL N/A 08/18/2022   Procedure: COLONOSCOPY WITH PROPOFOL;  Surgeon: Jaynie Collins, DO;  Location: [facility] ENDOSCOPY;  Service: Gastroenterology;  Laterality: N/A;  IDDM   ESOPHAGOGASTRODUODENOSCOPY (EGD) WITH PROPOFOL N/A 06/29/2021   Procedure: ESOPHAGOGASTRODUODENOSCOPY (EGD) WITH PROPOFOL;  Surgeon: Midge Minium, MD;  Location: [facility] ENDOSCOPY;  Service: Endoscopy;  Laterality: N/A;   INTERSTIM IMPLANT PLACEMENT N/A 10/18/2022   Procedure: Leane Platt IMPLANT SECOND STAGE;  Surgeon: Alfredo Martinez, MD;  Location: [facility];  Service: Urology;  Laterality: N/A;  NEEDS TO BE INTUBATED WITH NO MUSCLE RELAXANT   NEEDS 75 MINS FOR THIS CASE   INTERSTIM IMPLANT PLACEMENT N/A 10/18/2022   Procedure: Leane Platt IMPLANT FIRST STAGE;  Surgeon: Alfredo Martinez, MD;  Location: [facility];  Service: Urology;  Laterality: N/A;   POLYPECTOMY  08/18/2022   Procedure: POLYPECTOMY;  Surgeon:  Jaynie Collins, DO;  Location: [facility] ENDOSCOPY;  Service: Gastroenterology;;   ROBOTIC ASSISTED TOTAL HYSTERECTOMY WITH BILATERAL SALPINGO OOPHERECTOMY Bilateral 04/12/2022   Procedure: XI ROBOTIC ASSISTED TOTAL HYSTERECTOMY WITH BILATERAL SALPINGO OOPHORECTOMY;  Surgeon: Clide Cliff, MD;  Location: [facility];  Service: Gynecology;  Laterality: Bilateral;   SENTINEL NODE BIOPSY N/A 04/12/2022   Procedure: SENTINEL NODE INJECTION;  Surgeon:  Clide Cliff, MD;  Location: [facility];  Service: Gynecology;  Laterality: N/A;   TUBAL LIGATION  2000    OB History     Gravida  1   Para  1   Term  1   Preterm      AB      Living  1      SAB      IAB      Ectopic      Multiple      Live Births  1            Home Medications    Prior to Admission medications   Medication Sig Start Date End Date Taking? Authorizing Provider  ACCU-CHEK SOFTCLIX LANCETS lancets CHECK GLUCOSE TWICE DIALY FOR GLUCOSE MONITORING E11.9 06/09/17  Yes [provider]  ADVAIR DISKUS 500-50 MCG/DOSE AEPB Inhale 2 puffs into the lungs daily. 04/24/17  Yes [provider]  amiodarone (PACERONE) 200 MG tablet Take 400 mg by mouth daily.   Yes [provider]  amLODipine (NORVASC) 10 MG tablet Take 1 tablet by mouth daily. 10/17/18  Yes [provider]  apixaban (ELIQUIS) 5 MG TABS tablet Take 10 mg by mouth daily.   Yes [provider]  atorvastatin (LIPITOR) 20 MG tablet Take 20 mg by mouth daily. 10/04/19  Yes [provider]  buPROPion (WELLBUTRIN SR) 100 MG 12 hr tablet Take 200 mg by mouth daily. 08/10/20  Yes [provider]  cephALEXin (KEFLEX) 500 MG capsule Take 1 capsule (500 mg total) by mouth 4 (four) times daily. 03/09/23  Yes Georgiana Spinner, NP  enalapril (VASOTEC) 20 MG tablet Take 20 mg by mouth daily. 02/13/20  Yes [provider]  ferrous sulfate 325 (65 FE) MG EC tablet Take 650 mg by mouth daily with breakfast. 11/30/15  Yes [provider]  furosemide (LASIX) 20 MG tablet Take 20 mg by mouth daily.   Yes [provider]  glipiZIDE (GLUCOTROL XL) 10 MG 24 hr tablet Take 10 mg by mouth daily. 06/21/22  Yes [provider]  glucose blood test strip USE AS DIRECTED TWICE A DAY 07/08/15  Yes [provider]  hydrochlorothiazide (MICROZIDE) 12.5 MG capsule Take 12.5 mg by mouth daily.   Yes [provider]   HYDROcodone-acetaminophen (NORCO) 5-325 MG tablet Take 1-2 tablets by mouth every 6 (six) hours as needed for moderate pain. 10/18/22  Yes MacDiarmid, Lorin Picket, MD  Insulin Glargine (BASAGLAR KWIKPEN) 100 UNIT/ML Inject 20 Units into the skin at bedtime as needed (if CBG over 200). 03/28/22  Yes Marrion Coy, MD  JARDIANCE 25 MG TABS tablet Take 25 mg by mouth daily. 07/16/20  Yes [provider]  loratadine (CLARITIN) 10 MG tablet Take 10 mg by mouth daily.   Yes [provider]  metFORMIN (GLUCOPHAGE-XR) 500 MG 24 hr tablet Take 2,000 mg by mouth daily. 05/26/17  Yes [provider]  metoprolol tartrate (LOPRESSOR) 25 MG tablet Take 37.5 mg by mouth daily. 03/25/22  Yes [provider]  naproxen sodium (ALEVE) 220 MG tablet Take 220 mg by  mouth daily as needed (headaches).   Yes [provider]  omeprazole (PRILOSEC) 40 MG capsule Take 40 mg by mouth daily. 12/15/21  Yes [provider]  doxycycline (VIBRAMYCIN) 100 MG capsule Take 1 capsule (100 mg total) by mouth 2 (two) times daily. 03/25/23   Katha Cabal, DO    Family History Family History  Problem Relation Age of Onset   Ovarian cancer Mother        mid 78s   Lung cancer Mother 88   Throat cancer Maternal Grandmother    Colon cancer Neg Hx    Breast cancer Neg Hx    Endometrial cancer Neg Hx    Pancreatic cancer Neg Hx    Prostate cancer Neg Hx     Social History Social History   Tobacco Use   Smoking status: Former    Types: Cigarettes    Passive exposure: Past   Smokeless tobacco: Never  Vaping Use   Vaping status: Every Day   Substances: Nicotine, Flavoring  Substance Use Topics   Alcohol use: Yes    Alcohol/week: 2.0 standard drinks of alcohol    Types: 1 Glasses of wine, 1 Shots of liquor per week    Comment: occ   Drug use: Never     Allergies   Aspirin   Review of Systems Review of Systems: :negative unless otherwise stated in HPI.      Physical  Exam Triage Vital Signs ED Triage Vitals  Encounter Vitals Group     BP 03/25/23 1123 117/72     Systolic BP Percentile --      Diastolic BP Percentile --      Pulse Rate 03/25/23 1123 (!) 52     Resp 03/25/23 1123 16     Temp 03/25/23 1123 98 F (36.7 C)     Temp Source 03/25/23 1123 Oral     SpO2 03/25/23 1123 100 %     Weight 03/25/23 1123 (!) 320 lb (145.2 kg)     Height 03/25/23 1123 5\' 3"  (1.6 m)     Head Circumference --      Peak Flow --      Pain Score 03/25/23 1127 0     Pain Loc --      Pain Education --      Exclude from Growth Chart --    No data found.  Updated Vital Signs BP 117/72 (BP Location: Left Arm)   Pulse (!) 52   Temp 98 F (36.7 C) (Oral)   Resp 16   Ht 5\' 3"  (1.6 m)   Wt (!) 145.2 kg   LMP 10/06/2017 (Approximate) Comment: Pt did not have period for approx 4 years then started periods again this spring, irregular. neg preg test  SpO2 100%   BMI 56.69 kg/m   Visual Acuity Right Eye Distance:   Left Eye Distance:   Bilateral Distance:    Right Eye Near:   Left Eye Near:    Bilateral Near:     Physical Exam GEN: well appearing female in no acute distress  CVS: well perfused  RESP: speaking in full sentences without pause  GU: Pelvic exam: normal internal vaginal exam deferred, no discharge at the vaginal introitus, vulva are normal, right superior mons pubis erythematous patch with induration but no fluctuance.   UC Treatments / Results  Labs (all labs ordered are listed, but only abnormal results are displayed) Labs Reviewed - No data to display   EKG   Radiology  No results found.  Procedures Procedures (including critical care time)  Medications Ordered in UC Medications - No data to display  Initial Impression / Assessment and Plan / UC Course  I have reviewed the triage vital signs and the nursing notes.  Pertinent labs & imaging results that were available during my care of the patient were reviewed by me and  considered in my medical decision making (see chart for details).      Patient is a 53 y.o.Marland Kitchen female  who presents for vaginal concern that started this morning at while she was in the shower.  Overall patient is well-appearing and afebrile.  Vital signs stable.  On exam, she has a developing abscess on her right upper upper mons pubis.  The area is too indurated for I&D at this time.  Recommended she perform warm compresses to allow the area to soften up for possible I&D in 72 hours.  She may have caught it early enough where antibiotics will work.  Patient voiced understanding.  Prescribed doxycycline twice daily for 7 days.  She denies history of antibiotic associated yeast infections.   Wet prep and urinalysis deferred.  Discussed MDM, treatment plan and plan for follow-up with patient who agrees with plan.       Final Clinical Impressions(s) / UC Diagnoses   Final diagnoses:  Abscess of groin, right     Discharge Instructions      Perform warm compresses 3-4 times a day for the next 72 hours.  If the area becomes soft, return to the urgent care for incision and drainage.  If the area is no longer painful, continue taking your antibiotics that it would likely resolve with antibiotics alone.      ED Prescriptions     Medication Sig Dispense Auth. Provider   doxycycline (VIBRAMYCIN) 100 MG capsule Take 1 capsule (100 mg total) by mouth 2 (two) times daily. 20 capsule Katha Cabal, DO      PDMP not reviewed this encounter.   Katha Cabal, DO 03/25/23 1158

## 2023-03-25 NOTE — Discharge Instructions (Addendum)
Perform warm compresses 3-4 times a day for the next 72 hours.  If the area becomes soft, return to the urgent care for incision and drainage.  If the area is no longer painful, continue taking your antibiotics that it would likely resolve with antibiotics alone.

## 2023-03-26 ENCOUNTER — Emergency Department: Payer: 59

## 2023-03-26 ENCOUNTER — Emergency Department
Admission: EM | Admit: 2023-03-26 | Discharge: 2023-03-26 | Disposition: A | Discharge status: Home / Self Care | Payer: 59 | Attending: Emergency Medicine | Admitting: Emergency Medicine

## 2023-03-26 DIAGNOSIS — J45909 Unspecified asthma, uncomplicated: Secondary | ICD-10-CM | POA: Diagnosis not present

## 2023-03-26 DIAGNOSIS — E119 Type 2 diabetes mellitus without complications: Secondary | ICD-10-CM | POA: Insufficient documentation

## 2023-03-26 DIAGNOSIS — M25552 Pain in left hip: Secondary | ICD-10-CM | POA: Diagnosis not present

## 2023-03-26 DIAGNOSIS — I10 Essential (primary) hypertension: Secondary | ICD-10-CM | POA: Diagnosis not present

## 2023-03-26 MED ORDER — HYDROCODONE-ACETAMINOPHEN 5-325 MG PO TABS: 1.0000 | ORAL_TABLET | ORAL | 0 refills | Status: DC | PRN

## 2023-03-26 MED ORDER — HYDROCODONE-ACETAMINOPHEN 5-325 MG PO TABS
1.0000 | ORAL_TABLET | Freq: Once | ORAL | Status: AC
  Administered 2023-03-26: 1 via ORAL
  Filled 2023-03-26: qty 1

## 2023-03-26 MED ORDER — METHOCARBAMOL 500 MG PO TABS: 1000.0000 mg | ORAL_TABLET | Freq: Three times a day (TID) | ORAL | 0 refills | Status: AC

## 2023-03-26 MED ORDER — DEXAMETHASONE SODIUM PHOSPHATE 10 MG/ML IJ SOLN
10.0000 mg | Freq: Once | INTRAMUSCULAR | Status: AC
  Administered 2023-03-26: 10 mg via INTRAMUSCULAR
  Filled 2023-03-26: qty 1

## 2023-03-26 NOTE — ED Notes (Signed)
Patient alert and oriented.  Son in law was contacted to pick patient up.  OK to discharge.  Patient educated.  Family on the way.  Patient states she preferred waiting in waiting room for family to arrive.

## 2023-03-26 NOTE — ED Provider Notes (Signed)
[facility] Provider Note    Event Date/Time   First MD Initiated Contact with Patient 03/26/23 2008     (approximate)   History   Hip Pain   HPI  Joan Graves is a 53 y.o. female with PMH of HTN, depresison, endometrial cancer, diabetes, asthma, anxiety, obesity presents for evaluation of left hip pain.  Patient has had hip pain for about a month and was increasing today which is what brought her in.  She states that the pain radiates across her anterior thigh and into her back.  No numbness or tingling in the leg.  She has tried taking tylenol and ibuprofen without relief.      Physical Exam   Triage Vital Signs: ED Triage Vitals  Encounter Vitals Group     BP 03/26/23 1750 (!) 134/122     Systolic BP Percentile --      Diastolic BP Percentile --      Pulse Rate 03/26/23 1750 68     Resp 03/26/23 1750 19     Temp 03/26/23 1750 97.7 F (36.5 C)     Temp Source 03/26/23 1750 Oral     SpO2 03/26/23 1750 94 %     Weight --      Height --      Head Circumference --      Peak Flow --      Pain Score 03/26/23 1754 10     Pain Loc --      Pain Education --      Exclude from Growth Chart --     Most recent vital signs: Vitals:   03/26/23 1750  BP: (!) 134/122  Pulse: 68  Resp: 19  Temp: 97.7 F (36.5 C)  SpO2: 94%   General: Awake, moderate distress on exam, clearly in pain. CV:  Good peripheral perfusion.  RRR. Resp:  Normal effort.  CTAB. Abd:  No distention.  Left hip: No overlying skin changes, anterior thigh, hip joint and left lower back very tender to palpation, unable to perform ROM due to patient's pain level, patient walks with a limp using a cane, sensation is intact across all dermatomes of the left lower extremity, dorsalis pedis pulse 2+ and regular.   ED Results / Procedures / Treatments   Labs (all labs ordered are listed, but only abnormal results are displayed) Labs Reviewed - No data to  display    RADIOLOGY  Left hip x-ray obtained, interpreted the images as well as reviewed this report which was any acute abnormalities but did show some joint space narrowing.   PROCEDURES:  Critical Care performed: No  Procedures   MEDICATIONS ORDERED IN ED: Medications  dexamethasone (DECADRON) injection 10 mg (has no administration in time range)  HYDROcodone-acetaminophen (NORCO/VICODIN) 5-325 MG per tablet 1 tablet (has no administration in time range)     IMPRESSION / MDM / ASSESSMENT AND PLAN / ED COURSE  I reviewed the triage vital signs and the nursing notes.                             53 year old female presents for evaluation of left hip pain.  Patient is hypertensive but does have history of hypertension.  Vital signs are stable otherwise.  Patient in pain on exam.  Differential diagnosis includes, but is not limited to, fracture, dislocation, arthritis, bursitis.  Patient's presentation is most consistent with acute complicated illness / injury requiring  diagnostic workup.  Left hip x-ray was negative aside from joint space narrowing.  Difficult to perform physical exam due to patient's level of pain.  She is very tender to palpation and has significant pain with any movement of the hip.  Given that the x-ray is negative for fracture and patient has not had any recent trauma, I believe her pain due to arthritis.  I will send patient with a short course of pain medication as well as muscle relaxer.  She was advised to continue taking Tylenol and ibuprofen as needed.  She can ice and heat as needed.  We discussed topical pain relievers.  She was recommended to follow-up with orthopedics.  She voiced understanding, all questions were answered and she was stable at discharge.     FINAL CLINICAL IMPRESSION(S) / ED DIAGNOSES   Final diagnoses:  Left hip pain     Rx / DC Orders   ED Discharge Orders          Ordered    HYDROcodone-acetaminophen (NORCO/VICODIN)  5-325 MG tablet  Every 4 hours PRN        03/26/23 2146    methocarbamol (ROBAXIN) 500 MG tablet  3 times daily        03/26/23 2146             Note:  This document was prepared using Dragon voice recognition software and may include unintentional dictation errors.   Cameron Ali, PA-C 03/26/23 2150    Concha Se, MD 03/26/23 [phone]

## 2023-03-26 NOTE — ED Triage Notes (Addendum)
Pt c/o L hip pain x1 month. Pain score 10/10.  Pt denies injury and radiation into back or down leg.  Pt reports taking OTC medication w/o relief.

## 2023-03-26 NOTE — ED Notes (Signed)
Complaining of left hip pain, radiating to back.  Alert and oriented.  No signs of distress.

## 2023-03-26 NOTE — ED Triage Notes (Signed)
First Nurse Note:  Pt via [facility] from home. Pt c/o L hip pain for the past 3 week. Denies injury. Pt is A&Ox4 and NAD

## 2023-03-26 NOTE — ED Provider Triage Note (Signed)
Emergency Medicine Provider Triage Evaluation Note  Joan Graves , a 53 y.o. female  was evaluated in triage.  Pt complains of nontraumatic left hip pain for the past month.  No radiculopathy.  No improvement with over-the-counter medications..   Physical Exam  Pulse 68   Temp 97.7 F (36.5 C) (Oral)   Resp 19   LMP 10/06/2017 (Approximate) Comment: Pt did not have period for approx 4 years then started periods again this spring, irregular. neg preg test  SpO2 94%  Gen:   Awake, no distress   Resp:  Normal effort  MSK:   Moves extremities without difficulty  Other:    Medical Decision Making  Medically screening exam initiated at 5:55 PM.  Appropriate orders placed.  Joan Graves was informed that the remainder of the evaluation will be completed by another provider, this initial triage assessment does not replace that evaluation, and the importance of remaining in the ED until their evaluation is complete.    Joan Pester, FNP 03/26/23 2136

## 2023-03-26 NOTE — Discharge Instructions (Addendum)
You can continue to take the tylenol and ibuprofen for mild to moderate pain. You can take the Norco every 4 hours as needed for severe pain.  When taking the Norco do not take Tylenol in addition to this.  The Robaxin can be taken 3 times daily as needed for muscle spasms.  Do not drive after taking the Norco or the muscle relaxer.  Please schedule a follow up appointment with Dr. Audelia Acton who is an orthopedic specialist.

## 2023-04-06 ENCOUNTER — Other Ambulatory Visit: Payer: Self-pay | Admitting: Orthopedic Surgery

## 2023-04-06 DIAGNOSIS — M1991 Primary osteoarthritis, unspecified site: Secondary | ICD-10-CM

## 2023-04-09 ENCOUNTER — Ambulatory Visit
Admission: RE | Admit: 2023-04-09 | Discharge: 2023-04-09 | Discharge status: Home / Self Care | Payer: 59 | Source: Ambulatory Visit | Attending: Orthopedic Surgery | Admitting: Orthopedic Surgery

## 2023-04-09 DIAGNOSIS — M1991 Primary osteoarthritis, unspecified site: Secondary | ICD-10-CM | POA: Diagnosis present

## 2023-04-11 DIAGNOSIS — M1712 Unilateral primary osteoarthritis, left knee: Secondary | ICD-10-CM | POA: Insufficient documentation

## 2023-04-11 DIAGNOSIS — M1612 Unilateral primary osteoarthritis, left hip: Secondary | ICD-10-CM | POA: Insufficient documentation

## 2023-04-14 ENCOUNTER — Other Ambulatory Visit (INDEPENDENT_AMBULATORY_CARE_PROVIDER_SITE_OTHER): Payer: Self-pay | Admitting: Nurse Practitioner

## 2023-04-14 DIAGNOSIS — M7989 Other specified soft tissue disorders: Secondary | ICD-10-CM

## 2023-04-15 ENCOUNTER — Ambulatory Visit
Admission: EM | Admit: 2023-04-15 | Discharge: 2023-04-15 | Disposition: A | Discharge status: Home / Self Care | Payer: 59 | Attending: Physician Assistant | Admitting: Physician Assistant

## 2023-04-15 ENCOUNTER — Encounter: Payer: Self-pay | Admitting: Emergency Medicine

## 2023-04-15 DIAGNOSIS — L02811 Cutaneous abscess of head [any part, except face]: Secondary | ICD-10-CM | POA: Diagnosis not present

## 2023-04-15 DIAGNOSIS — I890 Lymphedema, not elsewhere classified: Secondary | ICD-10-CM | POA: Insufficient documentation

## 2023-04-15 MED ORDER — DOXYCYCLINE HYCLATE 100 MG PO CAPS: 100.0000 mg | ORAL_CAPSULE | Freq: Two times a day (BID) | ORAL | 0 refills | Status: AC

## 2023-04-15 NOTE — Discharge Instructions (Addendum)
-  Perform warm compresses 3-4 times a day for the next 72 hours. If the area becomes soft, return to the urgent care for incision and drainage. If the area is no longer painful, continue taking your antibiotics that it would likely resolve with antibiotics alone.  -If no improvement in 2-3 days return for re-evaluation

## 2023-04-15 NOTE — Progress Notes (Unsigned)
MRN : [id number]  Joan Graves is a 53 y.o. (04/01/1970) female who presents with chief complaint of legs hurt and swell.  History of Present Illness:   The patient returns to the office for followup evaluation regarding leg swelling.  The swelling has persisted and the pain associated with swelling continues. There have not been any interval development of a ulcerations or wounds.  Since the previous visit the patient has been wearing graduated compression stockings and has noted little if any improvement in the lymphedema. The patient has been using compression routinely morning until night.  The patient also states elevation during the day and exercise is being done too.  No outpatient medications have been marked as taking for the 04/17/23 encounter (Appointment) with Gilda Crease, Latina Craver, MD.    Past Medical History:  Diagnosis Date   Anemia    Anxiety    Aortic valve stenosis    Arthritis    Asthma    Bradycardia    Cancer (HCC)    endometrial cancer   Chronic pulmonary hypertension (HCC)    Depression    Diabetes mellitus without complication (HCC)    Dysrhythmia    Endometrial cancer (HCC)    GERD (gastroesophageal reflux disease)    Heart murmur    History of radiation therapy    Endometrial- HDR- 06/13/22-07/11/22- Dr. Antony Blackbird   Hypercholesteremia    Hypertension    Paroxysmal atrial fibrillation (HCC)    Paroxysmal atrial fibrillation (HCC)    Sleep apnea     Past Surgical History:  Procedure Laterality Date   CARDIOVERSION N/A 12/30/2020   Procedure: CARDIOVERSION;  Surgeon: Lamar Blinks, MD;  Location: [facility];  Service: Cardiovascular;  Laterality: N/A;   CARDIOVERSION N/A 08/24/2022   Procedure: CARDIOVERSION;  Surgeon: Alwyn Pea, MD;  Location: [facility];  Service: Cardiovascular;  Laterality: N/A;   CATARACT EXTRACTION Right 11/2021   CATARACT EXTRACTION Left 12/2021   COLONOSCOPY     COLONOSCOPY WITH PROPOFOL N/A  08/18/2022   Procedure: COLONOSCOPY WITH PROPOFOL;  Surgeon: Jaynie Collins, DO;  Location: [facility] ENDOSCOPY;  Service: Gastroenterology;  Laterality: N/A;  IDDM   ESOPHAGOGASTRODUODENOSCOPY (EGD) WITH PROPOFOL N/A 06/29/2021   Procedure: ESOPHAGOGASTRODUODENOSCOPY (EGD) WITH PROPOFOL;  Surgeon: Midge Minium, MD;  Location: [facility] ENDOSCOPY;  Service: Endoscopy;  Laterality: N/A;   INTERSTIM IMPLANT PLACEMENT N/A 10/18/2022   Procedure: Leane Platt IMPLANT SECOND STAGE;  Surgeon: Alfredo Martinez, MD;  Location: [facility];  Service: Urology;  Laterality: N/A;  NEEDS TO BE INTUBATED WITH NO MUSCLE RELAXANT   NEEDS 75 MINS FOR THIS CASE   INTERSTIM IMPLANT PLACEMENT N/A 10/18/2022   Procedure: Leane Platt IMPLANT FIRST STAGE;  Surgeon: Alfredo Martinez, MD;  Location: [facility];  Service: Urology;  Laterality: N/A;   POLYPECTOMY  08/18/2022   Procedure: POLYPECTOMY;  Surgeon: Jaynie Collins, DO;  Location: [facility] ENDOSCOPY;  Service: Gastroenterology;;   ROBOTIC ASSISTED TOTAL HYSTERECTOMY WITH BILATERAL SALPINGO OOPHERECTOMY Bilateral 04/12/2022   Procedure: XI ROBOTIC ASSISTED TOTAL HYSTERECTOMY WITH BILATERAL SALPINGO OOPHORECTOMY;  Surgeon: Clide Cliff, MD;  Location: [facility];  Service: Gynecology;  Laterality: Bilateral;   SENTINEL NODE BIOPSY N/A 04/12/2022   Procedure: SENTINEL NODE INJECTION;  Surgeon: Clide Cliff, MD;  Location: [facility];  Service: Gynecology;  Laterality: N/A;   TUBAL LIGATION  2000    Social History Social History   Tobacco Use   Smoking status: Former    Types: Cigarettes  Passive exposure: Past   Smokeless tobacco: Never  Vaping Use   Vaping status: Every Day   Substances: Nicotine, Flavoring  Substance Use Topics   Alcohol use: Yes    Alcohol/week: 2.0 standard drinks of alcohol    Types: 1 Glasses of wine, 1 Shots of liquor per week    Comment: occ   Drug use: Never    Family History Family History  Problem Relation Age of Onset   Ovarian  cancer Mother        mid 36s   Lung cancer Mother 72   Throat cancer Maternal Grandmother    Colon cancer Neg Hx    Breast cancer Neg Hx    Endometrial cancer Neg Hx    Pancreatic cancer Neg Hx    Prostate cancer Neg Hx     Allergies  Allergen Reactions   Aspirin Diarrhea    "high doses" gives her diarrhea     REVIEW OF SYSTEMS (Negative unless checked)  Constitutional: [] Weight loss  [] Fever  [] Chills Cardiac: [] Chest pain   [] Chest pressure   [] Palpitations   [] Shortness of breath when laying flat   [] Shortness of breath with exertion. Vascular:  [] Pain in legs with walking   [x] Pain in legs at rest  [] History of DVT   [] Phlebitis   [x] Swelling in legs   [] Varicose veins   [] Non-healing ulcers Pulmonary:   [] Uses home oxygen   [] Productive cough   [] Hemoptysis   [] Wheeze  [] COPD   [] Asthma Neurologic:  [] Dizziness   [] Seizures   [] History of stroke   [] History of TIA  [] Aphasia   [] Vissual changes   [] Weakness or numbness in arm   [] Weakness or numbness in leg Musculoskeletal:   [] Joint swelling   [] Joint pain   [] Low back pain Hematologic:  [] Easy bruising  [] Easy bleeding   [] Hypercoagulable state   [] Anemic Gastrointestinal:  [] Diarrhea   [] Vomiting  [] Gastroesophageal reflux/heartburn   [] Difficulty swallowing. Genitourinary:  [] Chronic kidney disease   [] Difficult urination  [] Frequent urination   [] Blood in urine Skin:  [] Rashes   [] Ulcers  Psychological:  [] History of anxiety   []  History of major depression.  Physical Examination  There were no vitals filed for this visit. There is no height or weight on file to calculate BMI. Gen: WD/WN, NAD Head: [place]/AT, No temporalis wasting.  Ear/Nose/Throat: Hearing grossly intact, nares w/o erythema or drainage, pinna without lesions Eyes: PER, EOMI, sclera nonicteric.  Neck: Supple, no gross masses.  No JVD.  Pulmonary:  Good air movement, no audible wheezing, no use of accessory muscles.  Cardiac: RRR, precordium not  hyperdynamic. Vascular:  scattered varicosities present bilaterally.  Moderate venous stasis changes to the legs bilaterally.  2+ soft pitting edema. CEAP [id number]   Vessel Right Left  Radial Palpable Palpable  Gastrointestinal: soft, non-distended. No guarding/no peritoneal signs.  Musculoskeletal: M/S 5/5 throughout.  No deformity.  Neurologic: CN 2-12 intact. Pain and light touch intact in extremities.  Symmetrical.  Speech is fluent. Motor exam as listed above. Psychiatric: Judgment intact, Mood & affect appropriate for pt's clinical situation. Dermatologic: Venous rashes no ulcers noted.  No changes consistent with cellulitis. Lymph : No lichenification or skin changes of chronic lymphedema.  CBC Lab Results  Component Value Date   WBC 4.0 11/07/2022   HGB 11.9 (L) 11/07/2022   HCT 33.0 (L) 11/07/2022   MCV 93.0 11/07/2022   PLT 213 11/07/2022    BMET    Component Value Date/Time   NA  136 11/07/2022 1005   K 3.9 11/07/2022 1005   CL 99 11/07/2022 1005   CO2 24 11/07/2022 1005   GLUCOSE 212 (H) 11/07/2022 1005   BUN 23 (H) 11/07/2022 1005   CREATININE 0.97 11/07/2022 1005   CREATININE 0.69 03/02/2022 1215   CALCIUM 8.8 (L) 11/07/2022 1005   GFRNONAA >60 11/07/2022 1005   GFRNONAA >60 03/02/2022 1215   CrCl cannot be calculated (Patient's most recent lab result is older than the maximum 21 days allowed.).  COAG Lab Results  Component Value Date   INR 1.4 (H) 08/30/2022   INR 1.2 05/30/2022    Radiology DG Hip Unilat W or Wo Pelvis 2-3 Views Left Result Date: 03/26/2023 CLINICAL DATA:  Left hip pain for 1 month. EXAM: DG HIP (WITH OR WITHOUT PELVIS) 2-3V LEFT COMPARISON:  None Available. FINDINGS: Slight hip joint space narrowing. Femoral head is well seated. No fracture. No erosion or evidence of avascular necrosis. No suspicious bone lesion or bone destruction. The pubic symphysis and sacroiliac joints are congruent. Pre sacral stimulator in place. Unremarkable  soft tissues. IMPRESSION: Slight left hip joint space narrowing. Electronically Signed   By: Narda Rutherford M.D.   On: 03/26/2023 19:06     Assessment/Plan There are no diagnoses linked to this encounter.   Levora Dredge, MD  04/15/2023 4:40 PM

## 2023-04-15 NOTE — ED Triage Notes (Signed)
Patient reports abscess on the back of her head for 3 days.  Patient states that she scratched it and the area opened up.  Patient reports some drainage from the site.  Patient denies fevers.

## 2023-04-15 NOTE — ED Provider Notes (Signed)
[facility]    CSN: [id number] Arrival date & time: 04/15/23  1047      History   Chief Complaint Chief Complaint  Patient presents with   Abscess    HPI Joan Graves is a 53 y.o. female presenting for evaluation of an area of swelling, redness and drainage of the back of her head for the past 2 to 3 days.  She says the drainage has been clear to yellow and bloody.  The area is tender.  She has a bandage covering the area now.  Patient has tried to keep the area clean and apply Neosporin.  She has a history of abscesses in other locations.  Was treated for an abscess in a different area last month with doxycycline.  She did not have to have the area incised and drained.  Does not have any fevers.  No other complaints.  HPI  Past Medical History:  Diagnosis Date   Anemia    Anxiety    Aortic valve stenosis    Arthritis    Asthma    Bradycardia    Cancer (HCC)    endometrial cancer   Chronic pulmonary hypertension (HCC)    Depression    Diabetes mellitus without complication (HCC)    Dysrhythmia    Endometrial cancer (HCC)    GERD (gastroesophageal reflux disease)    Heart murmur    History of radiation therapy    Endometrial- HDR- 06/13/22-07/11/22- Dr. Antony Blackbird   Hypercholesteremia    Hypertension    Paroxysmal atrial fibrillation (HCC)    Paroxysmal atrial fibrillation [facility])    Sleep apnea     Patient Active Problem List   Diagnosis Date Noted   Cellulitis of nose 03/26/2022   Endometrial cancer (HCC) 03/26/2022   Diabetes mellitus without complication (HCC) 03/26/2022   Morbid obesity with BMI of 50.0-59.9, adult (HCC) 03/26/2022   HLD (hyperlipidemia) 03/26/2022   Depression with anxiety 03/26/2022   Abscess of nose 03/26/2022   Family history of ovarian cancer 02/03/2022   PMB (postmenopausal bleeding) 02/03/2022   Esophageal dysphagia    Problems with swallowing and mastication    Paroxysmal atrial fibrillation (HCC)  12/09/2020   Chronic pulmonary hypertension (HCC) 06/11/2020   Moderate aortic valve stenosis 06/11/2020   Benign essential hypertension 12/02/2014   Hyperlipidemia, mixed 12/11/2013   Obstructive sleep apnea 12/11/2013   Asthma 12/09/2013   Diabetes (HCC) 12/09/2013   Gastroesophageal reflux disease 12/09/2013    Past Surgical History:  Procedure Laterality Date   CARDIOVERSION N/A 12/30/2020   Procedure: CARDIOVERSION;  Surgeon: Lamar Blinks, MD;  Location: [facility] ORS;  Service: Cardiovascular;  Laterality: N/A;   CARDIOVERSION N/A 08/24/2022   Procedure: CARDIOVERSION;  Surgeon: Alwyn Pea, MD;  Location: [facility] ORS;  Service: Cardiovascular;  Laterality: N/A;   CATARACT EXTRACTION Right 11/2021   CATARACT EXTRACTION Left 12/2021   COLONOSCOPY     COLONOSCOPY WITH PROPOFOL N/A 08/18/2022   Procedure: COLONOSCOPY WITH PROPOFOL;  Surgeon: Jaynie Collins, DO;  Location: [facility] ENDOSCOPY;  Service: Gastroenterology;  Laterality: N/A;  IDDM   ESOPHAGOGASTRODUODENOSCOPY (EGD) WITH PROPOFOL N/A 06/29/2021   Procedure: ESOPHAGOGASTRODUODENOSCOPY (EGD) WITH PROPOFOL;  Surgeon: Midge Minium, MD;  Location: [facility] ENDOSCOPY;  Service: Endoscopy;  Laterality: N/A;   INTERSTIM IMPLANT PLACEMENT N/A 10/18/2022   Procedure: Leane Platt IMPLANT SECOND STAGE;  Surgeon: Alfredo Martinez, MD;  Location: [facility] ORS;  Service: Urology;  Laterality: N/A;  NEEDS TO BE INTUBATED WITH NO MUSCLE RELAXANT  NEEDS 75 MINS FOR THIS CASE   INTERSTIM IMPLANT PLACEMENT N/A 10/18/2022   Procedure: Leane Platt IMPLANT FIRST STAGE;  Surgeon: Alfredo Martinez, MD;  Location: [facility];  Service: Urology;  Laterality: N/A;   POLYPECTOMY  08/18/2022   Procedure: POLYPECTOMY;  Surgeon: Jaynie Collins, DO;  Location: [facility] ENDOSCOPY;  Service: Gastroenterology;;   ROBOTIC ASSISTED TOTAL HYSTERECTOMY WITH BILATERAL SALPINGO OOPHERECTOMY Bilateral 04/12/2022   Procedure: XI ROBOTIC ASSISTED TOTAL HYSTERECTOMY WITH  BILATERAL SALPINGO OOPHORECTOMY;  Surgeon: Clide Cliff, MD;  Location: [facility] ORS;  Service: Gynecology;  Laterality: Bilateral;   SENTINEL NODE BIOPSY N/A 04/12/2022   Procedure: SENTINEL NODE INJECTION;  Surgeon: Clide Cliff, MD;  Location: [facility];  Service: Gynecology;  Laterality: N/A;   TUBAL LIGATION  2000    OB History     Gravida  1   Para  1   Term  1   Preterm      AB      Living  1      SAB      IAB      Ectopic      Multiple      Live Births  1            Home Medications    Prior to Admission medications   Medication Sig Start Date End Date Taking? Authorizing Provider  doxycycline (VIBRAMYCIN) 100 MG capsule Take 1 capsule (100 mg total) by mouth 2 (two) times daily for 10 days. 04/15/23 04/25/23 Yes Eusebio Friendly B, PA-C  ACCU-CHEK SOFTCLIX LANCETS lancets CHECK GLUCOSE TWICE DIALY FOR GLUCOSE MONITORING E11.9 06/09/17   [provider]  ADVAIR DISKUS 500-50 MCG/DOSE AEPB Inhale 2 puffs into the lungs daily. 04/24/17   [provider]  amiodarone (PACERONE) 200 MG tablet Take 400 mg by mouth daily.    [provider]  amLODipine (NORVASC) 10 MG tablet Take 1 tablet by mouth daily. 10/17/18   [provider]  apixaban (ELIQUIS) 5 MG TABS tablet Take 10 mg by mouth daily.    [provider]  atorvastatin (LIPITOR) 20 MG tablet Take 20 mg by mouth daily. 10/04/19   [provider]  buPROPion (WELLBUTRIN SR) 100 MG 12 hr tablet Take 200 mg by mouth daily. 08/10/20   [provider]  enalapril (VASOTEC) 20 MG tablet Take 20 mg by mouth daily. 02/13/20   [provider]  ferrous sulfate 325 (65 FE) MG EC tablet Take 650 mg by mouth daily with breakfast. 11/30/15   [provider]  furosemide (LASIX) 20 MG tablet Take 20 mg by mouth daily.    [provider]  glipiZIDE (GLUCOTROL XL) 10 MG 24 hr tablet Take 10 mg by mouth daily. 06/21/22   [provider]   glucose blood test strip USE AS DIRECTED TWICE A DAY 07/08/15   [provider]  hydrochlorothiazide (MICROZIDE) 12.5 MG capsule Take 12.5 mg by mouth daily.    [provider]  HYDROcodone-acetaminophen (NORCO/VICODIN) 5-325 MG tablet Take 1 tablet by mouth every 4 (four) hours as needed for severe pain (pain score 7-10). 03/26/23 03/25/24  Cameron Ali, PA-C  Insulin Glargine (BASAGLAR KWIKPEN) 100 UNIT/ML Inject 20 Units into the skin at bedtime as needed (if CBG over 200). 03/28/22   Marrion Coy, MD  JARDIANCE 25 MG TABS tablet Take 25 mg by mouth daily. 07/16/20   [provider]  loratadine (CLARITIN) 10 MG tablet Take 10 mg by mouth daily.  [provider]  metFORMIN (GLUCOPHAGE-XR) 500 MG 24 hr tablet Take 2,000 mg by mouth daily. 05/26/17   [provider]  metoprolol tartrate (LOPRESSOR) 25 MG tablet Take 37.5 mg by mouth daily. 03/25/22   [provider]  naproxen sodium (ALEVE) 220 MG tablet Take 220 mg by mouth daily as needed (headaches).    [provider]  omeprazole (PRILOSEC) 40 MG capsule Take 40 mg by mouth daily. 12/15/21   [provider]    Family History Family History  Problem Relation Age of Onset   Ovarian cancer Mother        mid 38s   Lung cancer Mother 68   Throat cancer Maternal Grandmother    Colon cancer Neg Hx    Breast cancer Neg Hx    Endometrial cancer Neg Hx    Pancreatic cancer Neg Hx    Prostate cancer Neg Hx     Social History Social History   Tobacco Use   Smoking status: Former    Types: Cigarettes    Passive exposure: Past   Smokeless tobacco: Never  Vaping Use   Vaping status: Every Day   Substances: Nicotine, Flavoring  Substance Use Topics   Alcohol use: Yes    Alcohol/week: 2.0 standard drinks of alcohol    Types: 1 Glasses of wine, 1 Shots of liquor per week    Comment: occ   Drug use: Never     Allergies   Aspirin   Review of Systems Review  of Systems  Constitutional:  Negative for fatigue and fever.  Skin:  Positive for color change and wound.  Neurological:  Negative for weakness.     Physical Exam Triage Vital Signs ED Triage Vitals  Encounter Vitals Group     BP 04/15/23 1200 128/84     Systolic BP Percentile --      Diastolic BP Percentile --      Pulse Rate 04/15/23 1200 (!) 56     Resp 04/15/23 1200 15     Temp 04/15/23 1200 (!) 97.5 F (36.4 C)     Temp Source 04/15/23 1200 Oral     SpO2 04/15/23 1200 98 %     Weight 04/15/23 1159 (!) 320 lb 1.7 oz (145.2 kg)     Height 04/15/23 1159 5\' 3"  (1.6 m)     Head Circumference --      Peak Flow --      Pain Score 04/15/23 1158 4     Pain Loc --      Pain Education --      Exclude from Growth Chart --    No data found.  Updated Vital Signs BP 128/84 (BP Location: Left Arm)   Pulse (!) 56   Temp (!) 97.5 F (36.4 C) (Oral)   Resp 15   Ht 5\' 3"  (1.6 m)   Wt (!) 320 lb 1.7 oz (145.2 kg)   LMP 10/06/2017 (Approximate) Comment: Pt did not have period for approx 4 years then started periods again this spring, irregular. neg preg test  SpO2 98%   BMI 56.70 kg/m      Physical Exam Vitals and nursing note reviewed.  Constitutional:      General: She is not in acute distress.    Appearance: Normal appearance. She is not ill-appearing or toxic-appearing.  HENT:     Head: Normocephalic and atraumatic.  Eyes:     General: No scleral icterus.       Right eye:  No discharge.        Left eye: No discharge.     Conjunctiva/sclera: Conjunctivae normal.  Cardiovascular:     Rate and Rhythm: Normal rate.  Pulmonary:     Effort: Pulmonary effort is normal. No respiratory distress.  Musculoskeletal:     Cervical back: Neck supple.  Skin:    General: Skin is dry.     Findings: Erythema (Occipital scalp: 2 cm x 2 cm area of erythema and induration without fluctuance. Bloody discharge noted. Area is very TTP.) present.  Neurological:     General: No focal  deficit present.     Mental Status: She is alert. Mental status is at baseline.     Motor: No weakness.  Psychiatric:        Mood and Affect: Mood normal.        Behavior: Behavior normal.      UC Treatments / Results  Labs (all labs ordered are listed, but only abnormal results are displayed) Labs Reviewed - No data to display  EKG   Radiology No results found.  Procedures Procedures (including critical care time)  Medications Ordered in UC Medications - No data to display  Initial Impression / Assessment and Plan / UC Course  I have reviewed the triage vital signs and the nursing notes.  Pertinent labs & imaging results that were available during my care of the patient were reviewed by me and considered in my medical decision making (see chart for details).   53 year old female presents for abscess of the occipital scalp for the past 3 days.  History of abscesses.  Has applied Neosporin without relief.  No associated fever.  Presentation consistent with abscess which is open and draining bloody material at this time.  Will treat with doxycycline x 10 days.  Encouraged use of warm compresses.  Advised to return if no improvement in a couple of days or if the area softens up and needs to be incised and drained, but it is already draining on its own so I do not suspect she will need to return.  Final Clinical Impressions(s) / UC Diagnoses   Final diagnoses:  Abscess of head     Discharge Instructions      -Perform warm compresses 3-4 times a day for the next 72 hours. If the area becomes soft, return to the urgent care for incision and drainage. If the area is no longer painful, continue taking your antibiotics that it would likely resolve with antibiotics alone.  -If no improvement in 2-3 days return for re-evaluation   ED Prescriptions     Medication Sig Dispense Auth. Provider   doxycycline (VIBRAMYCIN) 100 MG capsule Take 1 capsule (100 mg total) by mouth 2 (two)  times daily for 10 days. 20 capsule Shirlee Latch, PA-C      PDMP not reviewed this encounter.   Shirlee Latch, PA-C 04/15/23 1243

## 2023-04-17 ENCOUNTER — Ambulatory Visit (INDEPENDENT_AMBULATORY_CARE_PROVIDER_SITE_OTHER): Payer: 59 | Admitting: Vascular Surgery

## 2023-04-17 ENCOUNTER — Ambulatory Visit (INDEPENDENT_AMBULATORY_CARE_PROVIDER_SITE_OTHER): Payer: 59

## 2023-04-17 ENCOUNTER — Other Ambulatory Visit (INDEPENDENT_AMBULATORY_CARE_PROVIDER_SITE_OTHER): Payer: Self-pay | Admitting: Nurse Practitioner

## 2023-04-17 ENCOUNTER — Encounter (INDEPENDENT_AMBULATORY_CARE_PROVIDER_SITE_OTHER): Payer: Self-pay | Admitting: Vascular Surgery

## 2023-04-17 VITALS — BP 120/78 | HR 64 | Resp 18 | Ht 63.0 in | Wt 317.6 lb

## 2023-04-17 DIAGNOSIS — I890 Lymphedema, not elsewhere classified: Secondary | ICD-10-CM | POA: Diagnosis not present

## 2023-04-17 DIAGNOSIS — I10 Essential (primary) hypertension: Secondary | ICD-10-CM

## 2023-04-17 DIAGNOSIS — E782 Mixed hyperlipidemia: Secondary | ICD-10-CM

## 2023-04-17 DIAGNOSIS — K219 Gastro-esophageal reflux disease without esophagitis: Secondary | ICD-10-CM | POA: Diagnosis not present

## 2023-04-17 DIAGNOSIS — E119 Type 2 diabetes mellitus without complications: Secondary | ICD-10-CM

## 2023-04-17 DIAGNOSIS — M7989 Other specified soft tissue disorders: Secondary | ICD-10-CM

## 2023-04-17 DIAGNOSIS — I480 Paroxysmal atrial fibrillation: Secondary | ICD-10-CM

## 2023-04-20 ENCOUNTER — Ambulatory Visit
Admission: EM | Admit: 2023-04-20 | Discharge: 2023-04-20 | Disposition: A | Discharge status: Home / Self Care | Payer: 59 | Attending: Physician Assistant | Admitting: Physician Assistant

## 2023-04-20 DIAGNOSIS — I890 Lymphedema, not elsewhere classified: Secondary | ICD-10-CM | POA: Diagnosis not present

## 2023-04-20 DIAGNOSIS — M7989 Other specified soft tissue disorders: Secondary | ICD-10-CM | POA: Diagnosis not present

## 2023-04-20 DIAGNOSIS — L03116 Cellulitis of left lower limb: Secondary | ICD-10-CM

## 2023-04-20 MED ORDER — CEPHALEXIN 500 MG PO CAPS: 500.0000 mg | ORAL_CAPSULE | Freq: Four times a day (QID) | ORAL | 0 refills | Status: AC

## 2023-04-20 MED ORDER — CEFTRIAXONE SODIUM 1 G IJ SOLR
1.0000 g | Freq: Once | INTRAMUSCULAR | Status: AC
  Administered 2023-04-20: 1 g via INTRAMUSCULAR

## 2023-04-20 NOTE — ED Triage Notes (Signed)
Here for left leg swelling x 1 day, Denies any trauma or injury to her legs.

## 2023-04-20 NOTE — ED Provider Notes (Signed)
[facility]    CSN: [id number] Arrival date & time: 04/20/23  1259      History   Chief Complaint No chief complaint on file.   HPI Joan Graves is a 53 y.o. female with history of asthma, hypertension, pulmonary hypertension, diabetes, GERD, hyperlipidemia, obstructive sleep apnea, severe lymphedema, anxiety, depression, endometrial cancer, atrial fibrillation and long term use of Eliquis.  Patient presents today for onset of left leg swelling, pain and redness.  Denies injury.  No open wounds.  Currently on doxycycline for an abscess of her head.  States she has had cellulitis of her lower extremities before and this feels similar to previous episodes.  Was seen in the ED at that time and given Keflex.  Reports symptoms got better.  No history of DVT.  Patient says she had an ultrasound of both lower legs 3 days ago and says it was normal and negative for DVT.  She is awaiting someone to come and measure her legs so she can receive the lymphedema pump.  Patient follows with [facility].  She was seen 3 days ago for her severe lymphedema. Dr Gilda Crease made the following remarks:  "The patient returns to the office for followup evaluation regarding leg swelling.  The swelling has persisted and the pain associated with swelling continues.  She notes that the pain is quite severe.  There have not been any interval development of a ulcerations or wounds. Since the previous visit the patient has tried wearing graduated compression stockings and has noted little if any improvement in the lymphedema.  In fact, the cut into her at the ankle and were very painful. The patient also states elevation during the day and exercise is being done too."   HPI  Past Medical History:  Diagnosis Date   Anemia    Anxiety    Aortic valve stenosis    Arthritis    Asthma    Bradycardia    Cancer (HCC)    endometrial cancer   Chronic pulmonary hypertension (HCC)     Depression    Diabetes mellitus without complication (HCC)    Dysrhythmia    Endometrial cancer (HCC)    GERD (gastroesophageal reflux disease)    Heart murmur    History of radiation therapy    Endometrial- HDR- 06/13/22-07/11/22- Dr. Antony Blackbird   Hypercholesteremia    Hypertension    Paroxysmal atrial fibrillation (HCC)    Paroxysmal atrial fibrillation [facility])    Sleep apnea     Patient Active Problem List   Diagnosis Date Noted   Lymphedema 04/15/2023   Cellulitis of nose 03/26/2022   Endometrial cancer (HCC) 03/26/2022   Diabetes mellitus without complication (HCC) 03/26/2022   Morbid obesity with BMI of 50.0-59.9, adult (HCC) 03/26/2022   HLD (hyperlipidemia) 03/26/2022   Depression with anxiety 03/26/2022   Abscess of nose 03/26/2022   Family history of ovarian cancer 02/03/2022   PMB (postmenopausal bleeding) 02/03/2022   Esophageal dysphagia    Problems with swallowing and mastication    Paroxysmal atrial fibrillation (HCC) 12/09/2020   Chronic pulmonary hypertension (HCC) 06/11/2020   Moderate aortic valve stenosis 06/11/2020   Benign essential hypertension 12/02/2014   Hyperlipidemia, mixed 12/11/2013   Obstructive sleep apnea 12/11/2013   Asthma 12/09/2013   Diabetes (HCC) 12/09/2013   Gastroesophageal reflux disease 12/09/2013    Past Surgical History:  Procedure Laterality Date   CARDIOVERSION N/A 12/30/2020   Procedure: CARDIOVERSION;  Surgeon: Lamar Blinks, MD;  Location: [facility] ORS;  Service: Cardiovascular;  Laterality: N/A;   CARDIOVERSION N/A 08/24/2022   Procedure: CARDIOVERSION;  Surgeon: Alwyn Pea, MD;  Location: [facility] ORS;  Service: Cardiovascular;  Laterality: N/A;   CATARACT EXTRACTION Right 11/2021   CATARACT EXTRACTION Left 12/2021   COLONOSCOPY     COLONOSCOPY WITH PROPOFOL N/A 08/18/2022   Procedure: COLONOSCOPY WITH PROPOFOL;  Surgeon: Jaynie Collins, DO;  Location: [facility] ENDOSCOPY;  Service: Gastroenterology;   Laterality: N/A;  IDDM   ESOPHAGOGASTRODUODENOSCOPY (EGD) WITH PROPOFOL N/A 06/29/2021   Procedure: ESOPHAGOGASTRODUODENOSCOPY (EGD) WITH PROPOFOL;  Surgeon: Midge Minium, MD;  Location: [facility] ENDOSCOPY;  Service: Endoscopy;  Laterality: N/A;   INTERSTIM IMPLANT PLACEMENT N/A 10/18/2022   Procedure: Leane Platt IMPLANT SECOND STAGE;  Surgeon: Alfredo Martinez, MD;  Location: [facility] ORS;  Service: Urology;  Laterality: N/A;  NEEDS TO BE INTUBATED WITH NO MUSCLE RELAXANT   NEEDS 75 MINS FOR THIS CASE   INTERSTIM IMPLANT PLACEMENT N/A 10/18/2022   Procedure: Leane Platt IMPLANT FIRST STAGE;  Surgeon: Alfredo Martinez, MD;  Location: [facility] ORS;  Service: Urology;  Laterality: N/A;   POLYPECTOMY  08/18/2022   Procedure: POLYPECTOMY;  Surgeon: Jaynie Collins, DO;  Location: [facility] ENDOSCOPY;  Service: Gastroenterology;;   ROBOTIC ASSISTED TOTAL HYSTERECTOMY WITH BILATERAL SALPINGO OOPHERECTOMY Bilateral 04/12/2022   Procedure: XI ROBOTIC ASSISTED TOTAL HYSTERECTOMY WITH BILATERAL SALPINGO OOPHORECTOMY;  Surgeon: Clide Cliff, MD;  Location: [facility] ORS;  Service: Gynecology;  Laterality: Bilateral;   SENTINEL NODE BIOPSY N/A 04/12/2022   Procedure: SENTINEL NODE INJECTION;  Surgeon: Clide Cliff, MD;  Location: [facility] ORS;  Service: Gynecology;  Laterality: N/A;   TUBAL LIGATION  2000    OB History     Gravida  1   Para  1   Term  1   Preterm      AB      Living  1      SAB      IAB      Ectopic      Multiple      Live Births  1            Home Medications    Prior to Admission medications   Medication Sig Start Date End Date Taking? Authorizing Provider  ACCU-CHEK SOFTCLIX LANCETS lancets CHECK GLUCOSE TWICE DIALY FOR GLUCOSE MONITORING E11.9 06/09/17  Yes [provider]  ADVAIR DISKUS 500-50 MCG/DOSE AEPB Inhale 2 puffs into the lungs daily. 04/24/17  Yes [provider]  amiodarone (PACERONE) 200 MG tablet Take 400 mg by mouth daily.   Yes [provider]  amLODipine (NORVASC) 10 MG tablet Take 1 tablet by mouth daily. 10/17/18  Yes [provider]  apixaban (ELIQUIS) 5 MG TABS tablet Take 10 mg by mouth daily.   Yes [provider]  atorvastatin (LIPITOR) 20 MG tablet Take 20 mg by mouth daily. 10/04/19  Yes [provider]  buPROPion (WELLBUTRIN SR) 100 MG 12 hr tablet Take 200 mg by mouth daily. 08/10/20  Yes [provider]  cephALEXin (KEFLEX) 500 MG capsule Take 1 capsule (500 mg total) by mouth 4 (four) times daily for 10 days. 04/20/23 04/30/23 Yes Shirlee Latch, PA-C  doxycycline (VIBRAMYCIN) 100 MG capsule Take 1 capsule (100 mg total) by mouth 2 (two) times daily for 10 days. 04/15/23 04/25/23 Yes Eusebio Friendly B, PA-C  enalapril (VASOTEC) 20 MG tablet Take 20 mg by mouth daily. 02/13/20  Yes [provider]  ferrous sulfate 325 (65 FE) MG  EC tablet Take 650 mg by mouth daily with breakfast. 11/30/15  Yes [provider]  furosemide (LASIX) 20 MG tablet Take 20 mg by mouth daily.   Yes [provider]  glipiZIDE (GLUCOTROL XL) 10 MG 24 hr tablet Take 10 mg by mouth daily. 06/21/22  Yes [provider]  glucose blood test strip USE AS DIRECTED TWICE A DAY 07/08/15  Yes [provider]  hydrochlorothiazide (MICROZIDE) 12.5 MG capsule Take 12.5 mg by mouth daily.   Yes [provider]  JARDIANCE 25 MG TABS tablet Take 25 mg by mouth daily. 07/16/20  Yes [provider]  loratadine (CLARITIN) 10 MG tablet Take 10 mg by mouth daily.   Yes [provider]  metFORMIN (GLUCOPHAGE-XR) 500 MG 24 hr tablet Take 2,000 mg by mouth daily. 05/26/17  Yes [provider]  metoprolol tartrate (LOPRESSOR) 25 MG tablet Take 37.5 mg by mouth daily. 03/25/22  Yes [provider]  omeprazole (PRILOSEC) 40 MG capsule Take 40 mg by mouth daily. 12/15/21  Yes [provider]  OZEMPIC, 0.25 OR 0.5 MG/DOSE, 2 MG/3ML SOPN  04/11/23   Yes [provider]  HYDROcodone-acetaminophen (NORCO/VICODIN) 5-325 MG tablet Take 1 tablet by mouth every 4 (four) hours as needed for severe pain (pain score 7-10). 03/26/23 03/25/24  Cameron Ali, PA-C  Insulin Glargine (BASAGLAR KWIKPEN) 100 UNIT/ML Inject 20 Units into the skin at bedtime as needed (if CBG over 200). 03/28/22   Marrion Coy, MD  naproxen sodium (ALEVE) 220 MG tablet Take 220 mg by mouth daily as needed (headaches).    [provider]    Family History Family History  Problem Relation Age of Onset   Ovarian cancer Mother        mid 34s   Lung cancer Mother 71   Throat cancer Maternal Grandmother    Colon cancer Neg Hx    Breast cancer Neg Hx    Endometrial cancer Neg Hx    Pancreatic cancer Neg Hx    Prostate cancer Neg Hx     Social History Social History   Tobacco Use   Smoking status: Former    Types: Cigarettes    Passive exposure: Past   Smokeless tobacco: Never  Vaping Use   Vaping status: Every Day   Substances: Nicotine, Flavoring  Substance Use Topics   Alcohol use: Yes    Alcohol/week: 2.0 standard drinks of alcohol    Types: 1 Glasses of wine, 1 Shots of liquor per week    Comment: occ   Drug use: Never     Allergies   Aspirin   Review of Systems Review of Systems  Constitutional:  Negative for fatigue and fever.  Respiratory:  Negative for chest tightness, shortness of breath and wheezing.   Cardiovascular:  Positive for leg swelling. Negative for chest pain.  Gastrointestinal:  Negative for abdominal distention and abdominal pain.  Musculoskeletal:  Negative for gait problem.  Skin:  Positive for color change. Negative for rash and wound.  Neurological:  Negative for weakness and numbness.     Physical Exam Triage Vital Signs ED Triage Vitals  Encounter Vitals Group     BP      Systolic BP Percentile      Diastolic BP Percentile      Pulse      Resp      Temp      Temp src      SpO2  Weight      Height      Head Circumference      Peak Flow      Pain Score      Pain Loc      Pain Education      Exclude from Growth Chart    No data found.  Updated Vital Signs BP (!) 122/58 (BP Location: Left Arm)   Pulse 64   Temp 98 F (36.7 C) (Oral)   Resp 18   LMP 10/06/2017 (Approximate) Comment: Pt did not have period for approx 4 years then started periods again this spring, irregular. neg preg test  SpO2 98%    Physical Exam Vitals and nursing note reviewed.  Constitutional:      General: She is not in acute distress.    Appearance: Normal appearance. She is not ill-appearing or toxic-appearing.  HENT:     Head: Normocephalic and atraumatic.  Eyes:     General: No scleral icterus.       Right eye: No discharge.        Left eye: No discharge.     Conjunctiva/sclera: Conjunctivae normal.  Cardiovascular:     Rate and Rhythm: Normal rate and regular rhythm.     Heart sounds: Normal heart sounds.  Pulmonary:     Effort: Pulmonary effort is normal. No respiratory distress.     Breath sounds: Normal breath sounds.  Musculoskeletal:     Cervical back: Neck supple.  Skin:    General: Skin is dry.     Findings: Erythema present.     Comments: Circumference of right lower leg/calf is 48 cm and left is 49.5 cm.  See image below. She has significant erythema and swelling seemingly without tenderness of left lower leg. Warm to touch.  Neurological:     General: No focal deficit present.     Mental Status: She is alert. Mental status is at baseline.     Motor: No weakness.     Gait: Gait normal.  Psychiatric:        Mood and Affect: Mood normal.        Behavior: Behavior normal.      UC Treatments / Results  Labs (all labs ordered are listed, but only abnormal results are displayed) Labs Reviewed - No data to display  EKG   Radiology No results found.  Procedures Procedures (including critical care time)  Medications Ordered in UC Medications   cefTRIAXone (ROCEPHIN) injection 1 g (1 g Intramuscular Given 04/20/23 1604)    Initial Impression / Assessment and Plan / UC Course  I have reviewed the triage vital signs and the nursing notes.  Pertinent labs & imaging results that were available during my care of the patient were reviewed by me and considered in my medical decision making (see chart for details).   53 year old female with history of asthma, hypertension, pulmonary hypertension, diabetes, GERD, hyperlipidemia, obstructive sleep apnea, severe lymphedema, anxiety, depression, endometrial cancer, atrial fibrillation and long term use of Eliquis presents for 2-day history of left leg swelling and redness.  Denies significant discomfort.  No associated fever.  Says symptoms are similar to when she had cellulitis of her leg.  She is currently on doxycycline for an abscess of her head.  Seen by vein and vascular clinic 3 days ago before onset of symptoms and had ultrasound of lower extremities which did not show DVT per patient.  I reviewed notes from vein and vascular.  Patient has severe and  chronic lymphedema with hyperpigmentation of the skin.  She has been recommended to use a lymphedema pump and someone should come out and measure her legs soon so that they can fit her for a lymphedema pump.  She has follow-up in the next 6 months with vein and vascular.  Patient is currently afebrile.  Overall well-appearing.  See image included in chart.  She has significant erythema and swelling of the left lower extremity.  There is a 1.5 cm difference in circumference of the calves.  Does not appear to have significant tenderness.  No wounds or abrasions.  Patient is currently being treated for an active infection of her scalp with doxycycline and that infection has improved.  Advised patient I cannot 100% rule out DVT but I have lower suspicion for it since she is on Eliquis, has no history of DVT and symptoms are consistent with previous  episodes of cellulitis she has had in the past.  Discussed going to ED to have an ultrasound versus treating for cellulitis with strict ED precautions.  Patient says she does not have a way to get to and from the ER and does not want to go by EMS.  She declines to go to ER at this time and would like to be treated for possible infection first.  She was given 1 g IM Rocephin.  Sent Keflex to pharmacy (she has been treated with this before in ED for leg cellulitis--I reviewed records) advised her to finish out the doxycycline for her scalp infection.  Advised elevating extremities.  Continue to follow-up with vein and vascular.  I drew a line around the area of redness.  Explained that if her symptoms worsen over the next 24 hours or do not improve she should go to the ER for further evaluation.  She may need IV antibiotics and an ultrasound.  Patient is understanding and agrees to this plan.   Final Clinical Impressions(s) / UC Diagnoses   Final diagnoses:  Cellulitis of left lower extremity  Lymphedema  Left leg swelling     Discharge Instructions      -As discussed, I cannot rule out a DVT/blood clot just looking at your leg.  You do not want to go to the ER at this time and would like to be treated for possible infection first.  You were given an injection of an antibiotic in the clinic and I sent another antibiotic to the pharmacy for you.  Continue taking the doxycycline you are prescribed for your head abscess. - I drew a line around the area of redness.  If the redness or swelling worsen or do not improve in 24 hours, you develop a fever, worsening pain go immediately to the ER. - Try to make a follow-up appointment with your PCP as soon as possible.     ED Prescriptions     Medication Sig Dispense Auth. Provider   cephALEXin (KEFLEX) 500 MG capsule Take 1 capsule (500 mg total) by mouth 4 (four) times daily for 10 days. 40 capsule Shirlee Latch, PA-C      PDMP not reviewed this  encounter.   Shirlee Latch, PA-C 04/20/23 [phone]

## 2023-04-20 NOTE — Discharge Instructions (Addendum)
-  As discussed, I cannot rule out a DVT/blood clot just looking at your leg.  You do not want to go to the ER at this time and would like to be treated for possible infection first.  You were given an injection of an antibiotic in the clinic and I sent another antibiotic to the pharmacy for you.  Continue taking the doxycycline you are prescribed for your head abscess. - I drew a line around the area of redness.  If the redness or swelling worsen or do not improve in 24 hours, you develop a fever, worsening pain go immediately to the ER. - Try to make a follow-up appointment with your PCP as soon as possible.

## 2023-05-08 ENCOUNTER — Other Ambulatory Visit: Payer: Self-pay

## 2023-05-08 ENCOUNTER — Encounter: Payer: Self-pay | Admitting: Internal Medicine

## 2023-05-08 ENCOUNTER — Encounter
Admission: RE | Disposition: A | Discharge status: Home / Self Care | Payer: Self-pay | Source: Home / Self Care | Attending: Internal Medicine

## 2023-05-08 ENCOUNTER — Ambulatory Visit
Admission: RE | Admit: 2023-05-08 | Discharge: 2023-05-08 | Disposition: A | Discharge status: Home / Self Care | Payer: 59 | Attending: Internal Medicine | Admitting: Internal Medicine

## 2023-05-08 DIAGNOSIS — I272 Pulmonary hypertension, unspecified: Secondary | ICD-10-CM | POA: Insufficient documentation

## 2023-05-08 HISTORY — PX: RIGHT HEART CATH: CATH118263

## 2023-05-08 LAB — POCT I-STAT EG7
Acid-Base Excess: 3 mmol/L — ABNORMAL HIGH (ref 0.0–2.0)
Bicarbonate: 29.1 mmol/L — ABNORMAL HIGH (ref 20.0–28.0)
Calcium, Ion: 1.23 mmol/L (ref 1.15–1.40)
HCT: 34 % — ABNORMAL LOW (ref 36.0–46.0)
Hemoglobin: 11.6 g/dL — ABNORMAL LOW (ref 12.0–15.0)
O2 Saturation: 61 %
Potassium: 4.2 mmol/L (ref 3.5–5.1)
Sodium: 141 mmol/L (ref 135–145)
TCO2: 31 mmol/L (ref 22–32)
pCO2, Ven: 48.2 mmHg (ref 44–60)
pH, Ven: 7.389 (ref 7.25–7.43)
pO2, Ven: 32 mmHg (ref 32–45)

## 2023-05-08 LAB — GLUCOSE, CAPILLARY: Glucose-Capillary: 165 mg/dL — ABNORMAL HIGH (ref 70–99)

## 2023-05-08 SURGERY — RIGHT HEART CATH
Anesthesia: Moderate Sedation | Laterality: Right

## 2023-05-08 MED ORDER — SODIUM CHLORIDE 0.9 % WEIGHT BASED INFUSION: 1.0000 mL/kg/h | INTRAVENOUS | Status: DC

## 2023-05-08 MED ORDER — MIDAZOLAM HCL 2 MG/2ML IJ SOLN
INTRAMUSCULAR | Status: DC | PRN
  Administered 2023-05-08: 1 mg via INTRAVENOUS

## 2023-05-08 MED ORDER — HEPARIN (PORCINE) IN NACL 1000-0.9 UT/500ML-% IV SOLN
INTRAVENOUS | Status: AC
  Filled 2023-05-08: qty 500

## 2023-05-08 MED ORDER — MIDAZOLAM HCL 2 MG/2ML IJ SOLN
INTRAMUSCULAR | Status: AC
  Filled 2023-05-08: qty 2

## 2023-05-08 MED ORDER — HEPARIN (PORCINE) IN NACL 1000-0.9 UT/500ML-% IV SOLN
INTRAVENOUS | Status: DC | PRN
  Administered 2023-05-08: 500 mL

## 2023-05-08 MED ORDER — SODIUM CHLORIDE 0.9 % WEIGHT BASED INFUSION
3.0000 mL/kg/h | INTRAVENOUS | Status: AC
  Administered 2023-05-08: 3 mL/kg/h via INTRAVENOUS

## 2023-05-08 MED ORDER — LIDOCAINE HCL (PF) 1 % IJ SOLN
INTRAMUSCULAR | Status: DC | PRN
  Administered 2023-05-08: 2 mL

## 2023-05-08 MED ORDER — SODIUM CHLORIDE 0.9% FLUSH: 3.0000 mL | INTRAVENOUS | Status: DC | PRN

## 2023-05-08 MED ORDER — LIDOCAINE HCL 1 % IJ SOLN
INTRAMUSCULAR | Status: AC
  Filled 2023-05-08: qty 20

## 2023-05-08 MED ORDER — SODIUM CHLORIDE 0.9 % IV SOLN: 250.0000 mL | INTRAVENOUS | Status: DC | PRN

## 2023-05-08 MED ORDER — SODIUM CHLORIDE 0.9% FLUSH: 3.0000 mL | Freq: Two times a day (BID) | INTRAVENOUS | Status: DC

## 2023-05-08 SURGICAL SUPPLY — 5 items
CATH BALLN WEDGE 5F 110CM (CATHETERS) IMPLANT
DRAPE BRACHIAL (DRAPES) IMPLANT
PACK CARDIAC CATH (CUSTOM PROCEDURE TRAY) ×1 IMPLANT
SET ATX-X65L (MISCELLANEOUS) IMPLANT
SHEATH GLIDE SLENDER 4/5FR (SHEATH) IMPLANT

## 2023-05-08 NOTE — Progress Notes (Signed)
 Dr. Juliann Pares at bedside speaking with pt. Re: RHC results. Pt. Verbalized understanding of conversation.

## 2023-05-08 NOTE — Progress Notes (Signed)
 53fr right brachial vein sheath removed at 1239hrs using manual pressure, hemostasis achieved at 1245 hrs.  Gauze and tegaderm applied to site.   [employer], [employer]

## 2023-05-08 NOTE — Discharge Instructions (Signed)
 Brachial  Site Care Refer to this sheet in the next few weeks. These instructions provide you with information about caring for yourself after your procedure. Your health care provider may also give you more specific instructions. Your treatment has been planned according to current medical practices, but problems sometimes occur. Call your health care provider if you have any problems or questions after your procedure. What can I expect after the procedure? After your procedure, it is typical to have the following: Bruising at the brachial site that usually fades within 1-2 weeks. Blood collecting in the tissue (hematoma) that may be painful to the touch. It should usually decrease in size and tenderness within 1-2 weeks.  Follow these instructions at home: Take medicines only as directed by your health care provider. If you are on a medication called Metformin please do not take for 48 hours after your procedure. Over the next 48hrs please increase your fluid intake of water and non caffeine beverages to flush the contrast dye out of your system.  You may shower 24 hours after the procedure  Leave your bandage on and gently wash the site with plain soap and water. Pat the area dry with a clean towel. Do not rub the site, because this may cause bleeding.  Remove your dressing 48hrs after your procedure and leave open to air.  Do not submerge your site in water for 7 days. This includes swimming and washing dishes.  Check your insertion site every day for redness, swelling, or drainage. Do not apply powder or lotion to the site. Do not flex or bend the affected arm for 24 hours or as directed by your health care provider. Do not push or pull heavy objects with the affected arm for 24 hours or as directed by your health care provider. Do not lift over 10 lb (4.5 kg) for 5 days after your procedure or as directed by your health care provider. Ask your health care provider when it is okay to: Return  to work or school. Resume usual physical activities or sports. Resume sexual activity. Do not drive home if you are discharged the same day as the procedure. Have someone else drive you. You may drive 48 hours after the procedure Do not operate machinery or power tools for 24 hours after the procedure. If your procedure was done as an outpatient procedure, which means that you went home the same day as your procedure, a responsible adult should be with you for the first 24 hours after you arrive home. Keep all follow-up visits as directed by your health care provider. This is important. Contact a health care provider if: You have a fever. You have chills. You have increased bleeding from the radial site. Hold pressure on the site. Get help right away if: You have unusual pain at the brachial site. You have redness, warmth, or swelling at the brachial site. You have drainage (other than a small amount of blood on the dressing) from the radial site. The brachial site is bleeding, and the bleeding does not stop after 15 minutes of holding steady pressure on the site. Your arm or hand becomes pale, cool, tingly, or numb. This information is not intended to replace advice given to you by your health care provider. Make sure you discuss any questions you have with your health care provider. Document Released: 03/19/2010 Document Revised: 07/23/2015 Document Reviewed: 09/02/2013 Elsevier Interactive Patient Education  2018 [employer].

## 2023-05-14 ENCOUNTER — Ambulatory Visit
Admission: EM | Admit: 2023-05-14 | Discharge: 2023-05-14 | Disposition: A | Discharge status: Home / Self Care | Attending: Emergency Medicine | Admitting: Emergency Medicine

## 2023-05-14 ENCOUNTER — Encounter: Payer: Self-pay | Admitting: Emergency Medicine

## 2023-05-14 DIAGNOSIS — L0291 Cutaneous abscess, unspecified: Secondary | ICD-10-CM

## 2023-05-14 MED ORDER — DOXYCYCLINE HYCLATE 100 MG PO CAPS: 100.0000 mg | ORAL_CAPSULE | Freq: Two times a day (BID) | ORAL | 0 refills | Status: DC

## 2023-05-14 NOTE — ED Triage Notes (Signed)
 Patient states that she has another abscess on the back of her head that started a week ago.  Patient has another spot that has started to heal. Patient denies any fevers.

## 2023-05-14 NOTE — Discharge Instructions (Signed)
 Warm compresses as often as you want.  This will help it heal and or drain.  Initially doxycycline, even if you feel better.  Take 1 gram of tylenol up to 4 times a day as needed for pain.   Go to www.goodrx.com  or www.costplusdrugs.com to look up your medications. This will give you a list of where you can find your prescriptions at the most affordable prices. Or ask the pharmacist what the cash price is, or if they have any other discount programs available to help make your medication more affordable. This can be less expensive than what you would pay with insurance.

## 2023-05-14 NOTE — ED Provider Notes (Signed)
 HPI  SUBJECTIVE:  Joan Graves is a 53 y.o. female who presents with a painful mass on her right posterior scalp that has been present for the past week.  It has not changed in size.  No drainage, fevers, headaches.  No trauma, recent haircut.  She states this is next to the abscess that she had last month but got better with doxycycline.  She has been applying Neosporin and Vaseline.  She took Tylenol with mild improvement in her symptoms, last dose within the past 6 hours.  No aggravating factors. She has a past medical history of endometrial cancer status post hysterectomy, chronic pulmonary hypertension, diabetes, GERD, hypercholesterolemia, hypertension, paroxysmal atrial fibrillation on Eliquis, MRSA.  No history of chronic kidney disease.  [facility].  She was seen here on 2/15 with an abscess on the back of her head.  She was sent home with doxycycline for 10 days, warm compresses.  She was treated with Keflex for cellulitis of the lower extremity on 2/20.  She has never had a culture done of the abscess on her hand.  Past Medical History:  Diagnosis Date   Anemia    Anxiety    Aortic valve stenosis    Arthritis    Asthma    Bradycardia    Cancer (HCC)    endometrial cancer   Chronic pulmonary hypertension (HCC)    Depression    Diabetes mellitus without complication (HCC)    Dysrhythmia    Endometrial cancer (HCC)    GERD (gastroesophageal reflux disease)    Heart murmur    History of radiation therapy    Endometrial- HDR- 06/13/22-07/11/22- Dr. Antony Blackbird   Hypercholesteremia    Hypertension    Paroxysmal atrial fibrillation (HCC)    Paroxysmal atrial fibrillation [facility])    Sleep apnea     Past Surgical History:  Procedure Laterality Date   bladder stimulator Right 09/2022   placed by right buttocks   CARDIOVERSION N/A 12/30/2020   Procedure: CARDIOVERSION;  Surgeon: Lamar Blinks, MD;  Location: [facility] ORS;  Service: Cardiovascular;   Laterality: N/A;   CARDIOVERSION N/A 08/24/2022   Procedure: CARDIOVERSION;  Surgeon: Alwyn Pea, MD;  Location: [facility] ORS;  Service: Cardiovascular;  Laterality: N/A;   CATARACT EXTRACTION Right 11/2021   CATARACT EXTRACTION Left 12/2021   COLONOSCOPY     COLONOSCOPY WITH PROPOFOL N/A 08/18/2022   Procedure: COLONOSCOPY WITH PROPOFOL;  Surgeon: Jaynie Collins, DO;  Location: [facility] ENDOSCOPY;  Service: Gastroenterology;  Laterality: N/A;  IDDM   ESOPHAGOGASTRODUODENOSCOPY (EGD) WITH PROPOFOL N/A 06/29/2021   Procedure: ESOPHAGOGASTRODUODENOSCOPY (EGD) WITH PROPOFOL;  Surgeon: Midge Minium, MD;  Location: [facility] ENDOSCOPY;  Service: Endoscopy;  Laterality: N/A;   INTERSTIM IMPLANT PLACEMENT N/A 10/18/2022   Procedure: Leane Platt IMPLANT SECOND STAGE;  Surgeon: Alfredo Martinez, MD;  Location: [facility]S;  Service: Urology;  Laterality: N/A;  NEEDS TO BE INTUBATED WITH NO MUSCLE RELAXANT   NEEDS 75 MINS FOR THIS CASE   INTERSTIM IMPLANT PLACEMENT N/A 10/18/2022   Procedure: Leane Platt IMPLANT FIRST STAGE;  Surgeon: Alfredo Martinez, MD;  Location: [facility];  Service: Urology;  Laterality: N/A;   POLYPECTOMY  08/18/2022   Procedure: POLYPECTOMY;  Surgeon: Jaynie Collins, DO;  Location: [facility] ENDOSCOPY;  Service: Gastroenterology;;   RIGHT HEART CATH Right 05/08/2023   Procedure: RIGHT HEART CATH;  Surgeon: Alwyn Pea, MD;  Location: [facility] INVASIVE CV LAB;  Service: Cardiovascular;  Laterality: Right;   ROBOTIC ASSISTED TOTAL HYSTERECTOMY WITH  BILATERAL SALPINGO OOPHERECTOMY Bilateral 04/12/2022   Procedure: XI ROBOTIC ASSISTED TOTAL HYSTERECTOMY WITH BILATERAL SALPINGO OOPHORECTOMY;  Surgeon: Clide Cliff, MD;  Location: [facility];  Service: Gynecology;  Laterality: Bilateral;   SENTINEL NODE BIOPSY N/A 04/12/2022   Procedure: SENTINEL NODE INJECTION;  Surgeon: Clide Cliff, MD;  Location: [facility] ORS;  Service: Gynecology;  Laterality: N/A;   TOTAL VAGINAL HYSTERECTOMY  03/2022    TUBAL LIGATION  2000    Family History  Problem Relation Age of Onset   Ovarian cancer Mother        mid 23s   Lung cancer Mother 103   Throat cancer Maternal Grandmother    Colon cancer Neg Hx    Breast cancer Neg Hx    Endometrial cancer Neg Hx    Pancreatic cancer Neg Hx    Prostate cancer Neg Hx     Social History   Tobacco Use   Smoking status: Former    Types: Cigarettes    Passive exposure: Past   Smokeless tobacco: Never  Vaping Use   Vaping status: Some Days   Substances: Nicotine, Flavoring  Substance Use Topics   Alcohol use: Yes    Alcohol/week: 2.0 standard drinks of alcohol    Types: 1 Glasses of wine, 1 Shots of liquor per week    Comment: occ   Drug use: Never    No current facility-administered medications for this encounter.  Current Outpatient Medications:    doxycycline (VIBRAMYCIN) 100 MG capsule, Take 1 capsule (100 mg total) by mouth 2 (two) times daily for 10 days., Disp: 20 capsule, Rfl: 0   ACCU-CHEK SOFTCLIX LANCETS lancets, CHECK GLUCOSE TWICE DIALY FOR GLUCOSE MONITORING E11.9, Disp: , Rfl: 5   ADVAIR DISKUS 500-50 MCG/DOSE AEPB, Inhale 2 puffs into the lungs daily., Disp: , Rfl: 5   amiodarone (PACERONE) 200 MG tablet, Take 400 mg by mouth daily., Disp: , Rfl:    amLODipine (NORVASC) 10 MG tablet, Take 1 tablet by mouth daily., Disp: , Rfl:    apixaban (ELIQUIS) 5 MG TABS tablet, Take 10 mg by mouth daily., Disp: , Rfl:    atorvastatin (LIPITOR) 20 MG tablet, Take 20 mg by mouth daily., Disp: , Rfl:    buPROPion (WELLBUTRIN SR) 100 MG 12 hr tablet, Take 200 mg by mouth daily., Disp: , Rfl:    enalapril (VASOTEC) 20 MG tablet, Take 20 mg by mouth daily., Disp: , Rfl:    ferrous sulfate 325 (65 FE) MG EC tablet, Take 650 mg by mouth daily with breakfast., Disp: , Rfl:    furosemide (LASIX) 20 MG tablet, Take 20 mg by mouth daily., Disp: , Rfl:    glipiZIDE (GLUCOTROL XL) 10 MG 24 hr tablet, Take 10 mg by mouth daily., Disp: , Rfl:     glucose blood test strip, USE AS DIRECTED TWICE A DAY, Disp: , Rfl:    hydrochlorothiazide (MICROZIDE) 12.5 MG capsule, Take 12.5 mg by mouth daily. (Patient not taking: Reported on 05/08/2023), Disp: , Rfl:    HYDROcodone-acetaminophen (NORCO/VICODIN) 5-325 MG tablet, Take 1 tablet by mouth every 4 (four) hours as needed for severe pain (pain score 7-10). (Patient not taking: Reported on 05/08/2023), Disp: 20 tablet, Rfl: 0   Insulin Glargine (BASAGLAR KWIKPEN) 100 UNIT/ML, Inject 20 Units into the skin at bedtime as needed (if CBG over 200). (Patient not taking: Reported on 05/08/2023), Disp: , Rfl:    JARDIANCE 25 MG TABS tablet, Take 25 mg by mouth daily., Disp: , Rfl:  loratadine (CLARITIN) 10 MG tablet, Take 10 mg by mouth daily. (Patient not taking: Reported on 05/08/2023), Disp: , Rfl:    metFORMIN (GLUCOPHAGE-XR) 500 MG 24 hr tablet, Take 2,000 mg by mouth daily., Disp: , Rfl: 1   metoprolol tartrate (LOPRESSOR) 25 MG tablet, Take 37.5 mg by mouth daily. 0.5 tab BID per pt., Disp: , Rfl:    naproxen sodium (ALEVE) 220 MG tablet, Take 220 mg by mouth daily as needed (headaches)., Disp: , Rfl:    omeprazole (PRILOSEC) 40 MG capsule, Take 40 mg by mouth daily., Disp: , Rfl:    OZEMPIC, 0.25 OR 0.5 MG/DOSE, 2 MG/3ML SOPN, , Disp: , Rfl:   Allergies  Allergen Reactions   Aspirin Diarrhea    "high doses" gives her diarrhea     ROS  As noted in HPI.   Physical Exam  BP 121/76 (BP Location: Left Arm)   Pulse 66   Temp 97.8 F (36.6 C) (Oral)   Resp 14   Ht 5\' 3"  (1.6 m)   Wt (!) 142.1 kg   LMP 10/06/2017 (Approximate) Comment: Pt did not have period for approx 4 years then started periods again this spring, irregular. neg preg test  SpO2 96%   BMI 55.49 kg/m   Constitutional: Well developed, well nourished, no acute distress Eyes:  EOMI, conjunctiva normal bilaterally HENT: Normocephalic, atraumatic,mucus membranes moist Lymph: No posterior cervical  lymphadenopathy Respiratory: Normal inspiratory effort Cardiovascular: Normal rate GI: nondistended skin: 1 x 1 cm erythematous, raised, tender mass with's extensive scabbing, no central fluctuance or expressible purulent drainage posterior right scalp.  Healing lesion to the left of this.  0.25 x 0.25 nontender pustule on the further left neck  Musculoskeletal: no deformities Neurologic: Alert & oriented x 3, no focal neuro deficits Psychiatric: Speech and behavior appropriate   ED Course   Medications - No data to display  No orders of the defined types were placed in this encounter.   No results found for this or any previous visit (from the past 24 hours). No results found.  ED Clinical Impression  1. Abscess      ED Assessment/Plan     Previous records reviewed.  As noted in HPI.  Patient presents with a scalp abscess.  I do not appreciate any fluctuance today or any expressible purulent drainage.  I do not think that there is anything to I&D at this point in time.  Will send home with 10 days of doxycycline since patient states this worked well for her last time, warm compresses, Tylenol 1000 mg 3-4 times a day as needed for pain.  Follow-up here or with her PCP if symptoms get worse and we can consider an I&D at that time  Discussed MDM, treatment plan, and plan for follow-up with patient. patient agrees with plan.   Meds ordered this encounter  Medications   doxycycline (VIBRAMYCIN) 100 MG capsule    Sig: Take 1 capsule (100 mg total) by mouth 2 (two) times daily for 10 days.    Dispense:  20 capsule    Refill:  0      *This clinic note was created using Scientist, clinical (histocompatibility and immunogenetics). Therefore, there may be occasional mistakes despite careful proofreading.  ?    Domenick Gong, MD 05/14/23 1034

## 2023-05-22 ENCOUNTER — Encounter: Payer: Self-pay | Admitting: Psychiatry

## 2023-05-22 ENCOUNTER — Inpatient Hospital Stay: Payer: 59 | Attending: Psychiatry | Admitting: Psychiatry

## 2023-05-22 ENCOUNTER — Other Ambulatory Visit: Payer: Self-pay

## 2023-05-22 VITALS — BP 121/76 | HR 66 | Temp 97.8°F | Resp 20 | Wt 313.0 lb

## 2023-05-22 DIAGNOSIS — Z90710 Acquired absence of both cervix and uterus: Secondary | ICD-10-CM | POA: Diagnosis not present

## 2023-05-22 DIAGNOSIS — Z8542 Personal history of malignant neoplasm of other parts of uterus: Secondary | ICD-10-CM

## 2023-05-22 DIAGNOSIS — Z9221 Personal history of antineoplastic chemotherapy: Secondary | ICD-10-CM | POA: Insufficient documentation

## 2023-05-22 DIAGNOSIS — Z90722 Acquired absence of ovaries, bilateral: Secondary | ICD-10-CM | POA: Insufficient documentation

## 2023-05-22 DIAGNOSIS — Z08 Encounter for follow-up examination after completed treatment for malignant neoplasm: Secondary | ICD-10-CM | POA: Diagnosis present

## 2023-05-22 DIAGNOSIS — Z8543 Personal history of malignant neoplasm of ovary: Secondary | ICD-10-CM | POA: Diagnosis present

## 2023-05-22 DIAGNOSIS — N898 Other specified noninflammatory disorders of vagina: Secondary | ICD-10-CM | POA: Insufficient documentation

## 2023-05-22 DIAGNOSIS — Z9079 Acquired absence of other genital organ(s): Secondary | ICD-10-CM | POA: Diagnosis not present

## 2023-05-22 DIAGNOSIS — Z923 Personal history of irradiation: Secondary | ICD-10-CM | POA: Diagnosis not present

## 2023-05-22 DIAGNOSIS — C541 Malignant neoplasm of endometrium: Secondary | ICD-10-CM

## 2023-05-22 NOTE — Progress Notes (Signed)
 Gynecologic Oncology Return Clinic Visit  Date of Service: 05/22/2023 Referring Provider: Nicholaus Bloom, MD   Assessment & Plan: Joan Graves is a 53 y.o. woman with incompletely staged Stage IB (FIGO 2023 staging) FIGO grade 2 endometrioid endometrial cancer (no LVSI, 64% MI, p53wt, MMRp, POLE neg), s/p RA-TLH, BSO on 04/12/22 and completed VBT 07/11/22. Presents today for surveillance.  Endometrial cancer: - Pink area at central vaginal cuff. Could represent ongoing granulation tissue but recommend biopsy. Pt declines. Advised that this could delay diagnosis if it were to represent recurrence. She is accepting of this risk. - CT from 02/13/23 NED. - Signs/symptoms of recurrence reviewed. If bleeding, should return sooner. - Continue surveillance with follow-up q63mo initially with Gyn Onc. Pt prefers to defer Rad Onc follow-up unless particular need. After 1 year NED, can consider spacing to q79mo for surveillance (pt prefers this).  - Reviewed that after 5 years NED, will be safe to return to Ob/Gyn. - POLE testing negative. - Pt declined genetics referral.  - Given unable to surgically assess lymph nodes, recommend repeat CT in 69mo for surveillance, next prior to next visit. Ordered.   RTC 3 months, CT scan prior  Clide Cliff, MD Gynecologic Oncology  Medical Decision Making I personally spent  TOTAL 30 minutes face-to-face and non-face-to-face in the care of this patient, which includes all pre, intra, and post visit time on the date of service.   ----------------------- Reason for Visit: Surveillance  Treatment History: Oncology History  Endometrial cancer (HCC)  02/15/2022 Initial Biopsy   Endometrial biopsy: FIGO grade 2 endometrioid adenocarcinoma   03/26/2022 Initial Diagnosis   Endometrial cancer (HCC)   04/12/2022 Surgery   Robotic-assisted total laparoscopic hysterectomy, bilateral salpingo-oophorectomy, bilateral sentinel lymph node evaluation (no biopsy due  to morbid obesity and inability to visualize lymph channels/sentinel lymph node)   04/12/2022 Pathology Results   A. UTERUS WITH RIGHT AND LEFT FALLOPIAN TUBE AND OVARY, HYSTERECTOMY AND  BILATERAL SALPINGO-OOPHORECTOMY:  Invasive well to moderately differentiated endometrioid adenocarcinoma  with squamous morular metaplasia, FIGO 2  Tumor invades greater than 50% of the myometrium within the lower  uterine segment (7 of 11 mm, 64%) (pT1b)  Margins free  Bilateral acute and chronic salpingitis, proximally  Benign ovaries   An immunohistochemical stain for p53 performed on the biopsy  ([id number]) exhibited a wild-type staining pattern.   IHC EXPRESSION RESULTS  TEST           RESULT  MLH1:          Preserved nuclear expression  MSH2:          Preserved nuclear expression  MSH6:          Preserved nuclear expression  PMS2:          Preserved nuclear expression    04/12/2022 Cancer Staging   Staging form: Corpus Uteri - Carcinoma and Carcinosarcoma, AJCC 8th Edition - Pathologic stage from 04/12/2022: FIGO Stage IB, calculated as Stage Unknown (pT1b, pNX, cM0) - Signed by Clide Cliff, MD on 03/26/2023 Histopathologic type: Endometrioid adenocarcinoma, NOS Stage prefix: Initial diagnosis Histologic grade (G): G2 Histologic grading system: 3 grade system Lymph-vascular invasion (LVI): LVI not present (absent)/not identified     Interval History: Patient reports that she had a heart catheterization in 3/10 which showed mild pulmonary hypertension.  Has follow-up with pulmonology on/7/25 and cardiology in 06/18/2023.  Feels overall stable in terms of her chest pain and shortness of breath.  Does note some nausea and vomiting about  1.5 weeks ago.  Has had some ongoing nausea but no more vomiting. She otherwise denies any new vaginal bleeding, abdominal/pelvic pain, unintentional weight loss, change in bowel or bladder habits, early satiety, bloating.   Past Medical/Surgical  History: Past Medical History:  Diagnosis Date   Anemia    Anxiety    Aortic valve stenosis    Arthritis    Asthma    Bradycardia    Cancer (HCC)    endometrial cancer   Chronic pulmonary hypertension (HCC)    Depression    Diabetes mellitus without complication (HCC)    Dysrhythmia    Endometrial cancer (HCC)    GERD (gastroesophageal reflux disease)    Heart murmur    History of radiation therapy    Endometrial- HDR- 06/13/22-07/11/22- Dr. Antony Blackbird   Hypercholesteremia    Hypertension    Paroxysmal atrial fibrillation (HCC)    Paroxysmal atrial fibrillation [facility])    Sleep apnea     Past Surgical History:  Procedure Laterality Date   bladder stimulator Right 09/2022   placed by right buttocks   CARDIOVERSION N/A 12/30/2020   Procedure: CARDIOVERSION;  Surgeon: Lamar Blinks, MD;  Location: [facility] ORS;  Service: Cardiovascular;  Laterality: N/A;   CARDIOVERSION N/A 08/24/2022   Procedure: CARDIOVERSION;  Surgeon: Alwyn Pea, MD;  Location: [facility] ORS;  Service: Cardiovascular;  Laterality: N/A;   CATARACT EXTRACTION Right 11/2021   CATARACT EXTRACTION Left 12/2021   COLONOSCOPY     COLONOSCOPY WITH PROPOFOL N/A 08/18/2022   Procedure: COLONOSCOPY WITH PROPOFOL;  Surgeon: Jaynie Collins, DO;  Location: [facility] ENDOSCOPY;  Service: Gastroenterology;  Laterality: N/A;  IDDM   ESOPHAGOGASTRODUODENOSCOPY (EGD) WITH PROPOFOL N/A 06/29/2021   Procedure: ESOPHAGOGASTRODUODENOSCOPY (EGD) WITH PROPOFOL;  Surgeon: Midge Minium, MD;  Location: [facility] ENDOSCOPY;  Service: Endoscopy;  Laterality: N/A;   INTERSTIM IMPLANT PLACEMENT N/A 10/18/2022   Procedure: Leane Platt IMPLANT SECOND STAGE;  Surgeon: Alfredo Martinez, MD;  Location: [facility];  Service: Urology;  Laterality: N/A;  NEEDS TO BE INTUBATED WITH NO MUSCLE RELAXANT   NEEDS 75 MINS FOR THIS CASE   INTERSTIM IMPLANT PLACEMENT N/A 10/18/2022   Procedure: Leane Platt IMPLANT FIRST STAGE;  Surgeon: Alfredo Martinez,  MD;  Location: [facility] OR[facility];  Service: Urology;  Laterality: N/A;   POLYPECTOMY  08/18/2022   Procedure: POLYPECTOMY;  Surgeon: Jaynie Collins, DO;  Location: [facility] ENDOSCOPY;  Service: Gastroenterology;;   RIGHT HEART CATH Right 05/08/2023   Procedure: RIGHT HEART CATH;  Surgeon: Alwyn Pea, MD;  Location: [facility] INVASIVE CV LAB;  Service: Cardiovascular;  Laterality: Right;   ROBOTIC ASSISTED TOTAL HYSTERECTOMY WITH BILATERAL SALPINGO OOPHERECTOMY Bilateral 04/12/2022   Procedure: XI ROBOTIC ASSISTED TOTAL HYSTERECTOMY WITH BILATERAL SALPINGO OOPHORECTOMY;  Surgeon: Clide Cliff, MD;  Location: [facility];  Service: Gynecology;  Laterality: Bilateral;   SENTINEL NODE BIOPSY N/A 04/12/2022   Procedure: SENTINEL NODE INJECTION;  Surgeon: Clide Cliff, MD;  Location: [facility];  Service: Gynecology;  Laterality: N/A;   TOTAL VAGINAL HYSTERECTOMY  03/2022   TUBAL LIGATION  2000    Family History  Problem Relation Age of Onset   Ovarian cancer Mother        mid 20s   Lung cancer Mother 96   Throat cancer Maternal Grandmother    Colon cancer Neg Hx    Breast cancer Neg Hx    Endometrial cancer Neg Hx    Pancreatic cancer Neg Hx    Prostate cancer Neg Hx     Social  History   Socioeconomic History   Marital status: Married    Spouse name: Lyman Bishop   Number of children: 1   Years of education: Not on file   Highest education level: Not on file  Occupational History   Not on file  Tobacco Use   Smoking status: Former    Types: Cigarettes    Passive exposure: Past   Smokeless tobacco: Never  Vaping Use   Vaping status: Some Days   Substances: Nicotine, Flavoring  Substance and Sexual Activity   Alcohol use: Yes    Alcohol/week: 2.0 standard drinks of alcohol    Types: 1 Glasses of wine, 1 Shots of liquor per week    Comment: occ   Drug use: Never   Sexual activity: Yes    Birth control/protection: Surgical    Comment: Tubal Ligation  Other Topics Concern   Not on  file  Social History Narrative   Lives with husband, Lyman Bishop.  Also lives with ex-husband. Daughter & son in law currently lives with pt.    Social Drivers of Corporate investment banker Strain: Low Risk  (04/06/2023)   Received from [facility]   Overall Financial Resource Strain (CARDIA)    Difficulty of Paying Living Expenses: Not hard at all  Food Insecurity: No Food Insecurity (04/06/2023)   Received from [facility]   Hunger Vital Sign    Worried About Running Out of Food in the Last Year: Never true    Ran Out of Food in the Last Year: Never true  Recent Concern: Food Insecurity - Food Insecurity Present (04/01/2023)   Received from [facility]   Hunger Vital Sign    Worried About Running Out of Food in the Last Year: Sometimes true    Ran Out of Food in the Last Year: Sometimes true  Transportation Needs: No Transportation Needs (04/06/2023)   Received from [facility] - Transportation    In the past 12 months, has lack of transportation kept you from medical appointments or from getting medications?: No    Lack of Transportation (Non-Medical): No  Physical Activity: Not on file  Stress: Not on file  Social Connections: Not on file    Current Medications:  Current Outpatient Medications:    ACCU-CHEK SOFTCLIX LANCETS lancets, CHECK GLUCOSE TWICE DIALY FOR GLUCOSE MONITORING E11.9, Disp: , Rfl: 5   ADVAIR DISKUS 500-50 MCG/DOSE AEPB, Inhale 2 puffs into the lungs daily., Disp: , Rfl: 5   amiodarone (PACERONE) 200 MG tablet, Take 400 mg by mouth daily., Disp: , Rfl:    amLODipine (NORVASC) 10 MG tablet, Take 1 tablet by mouth daily., Disp: , Rfl:    apixaban (ELIQUIS) 5 MG TABS tablet, Take 10 mg by mouth daily., Disp: , Rfl:    atorvastatin (LIPITOR) 20 MG tablet, Take 20 mg by mouth daily., Disp: , Rfl:    buPROPion (WELLBUTRIN SR) 100 MG 12 hr tablet, Take 200 mg by mouth daily., Disp: , Rfl:     enalapril (VASOTEC) 20 MG tablet, Take 20 mg by mouth daily., Disp: , Rfl:    ferrous sulfate 325 (65 FE) MG EC tablet, Take 650 mg by mouth daily with breakfast., Disp: , Rfl:    furosemide (LASIX) 20 MG tablet, Take 20 mg by mouth daily., Disp: , Rfl:    glipiZIDE (GLUCOTROL XL) 10 MG 24 hr tablet, Take 10 mg by mouth daily., Disp: , Rfl:  glucose blood test strip, USE AS DIRECTED TWICE A DAY, Disp: , Rfl:    JARDIANCE 25 MG TABS tablet, Take 25 mg by mouth daily., Disp: , Rfl:    loratadine (CLARITIN) 10 MG tablet, Take 10 mg by mouth daily., Disp: , Rfl:    metFORMIN (GLUCOPHAGE-XR) 500 MG 24 hr tablet, Take 2,000 mg by mouth daily., Disp: , Rfl: 1   metoprolol tartrate (LOPRESSOR) 25 MG tablet, Take 37.5 mg by mouth daily. 0.5 tab BID per pt., Disp: , Rfl:    naproxen sodium (ALEVE) 220 MG tablet, Take 220 mg by mouth daily as needed (headaches)., Disp: , Rfl:    omeprazole (PRILOSEC) 40 MG capsule, Take 40 mg by mouth daily., Disp: , Rfl:    OZEMPIC, 0.25 OR 0.5 MG/DOSE, 2 MG/3ML SOPN, , Disp: , Rfl:   Review of Symptoms: Complete 10-system review is positive for: None  Physical Exam: BP 121/76 (BP Location: Left Arm, Patient Position: Sitting)   Pulse 66   Temp 97.8 F (36.6 C) (Oral)   Resp 20   Wt (!) 313 lb (142 kg)   LMP 10/06/2017 (Approximate) Comment: Pt did not have period for approx 4 years then started periods again this spring, irregular. neg preg test  SpO2 96%   BMI 55.45 kg/m  General: Alert, oriented, no acute distress. HEENT: Normocephalic, atraumatic. Neck symmetric without masses. Sclera anicteric.  Chest: Normal work of breathing. Clear to auscultation bilaterally.   Cardiovascular: Regular rate and rhythm, no murmurs. Abdomen: Soft, nontender.  Normoactive bowel sounds.   Well healed incisions.  Extremities: Grossly normal range of motion.  Warm, well perfused. Chronic skin changes over both lower extremities Skin: No skin lesions, resolution of erythema  of groins, vulva Lymphatics: No cervical, supraclavicular, or inguinal lymphadenopathy GU: Normal external genitalia.  Speculum exam with cuff with pink area at central cuff.  Bimanual exam with smooth vaginal cuff of what can be reached, no nodularity, no pelvic mass, exam limited by body habitus. Exam chaperoned by Kimberly [place], CMA   Laboratory & Radiologic Studies: none

## 2023-05-22 NOTE — Patient Instructions (Signed)
 It was a pleasure to see you in clinic today. - We discussed biopsy of the top of the vagina but this was not done today per your desires - Recommend CT scan in 69mo - Return visit planned for 69mo after CT scan.  Thank you very much for allowing me to provide care for you today.  I appreciate your confidence in choosing our Gynecologic Oncology team at [facility].  If you have any questions about your visit today please call our office or send [place] a MyChart message and we will get back to you as soon as possible.

## 2023-05-24 DIAGNOSIS — Z7901 Long term (current) use of anticoagulants: Secondary | ICD-10-CM | POA: Insufficient documentation

## 2023-06-14 DIAGNOSIS — N183 Chronic kidney disease, stage 3 unspecified: Secondary | ICD-10-CM | POA: Insufficient documentation

## 2023-07-01 ENCOUNTER — Ambulatory Visit
Admission: EM | Admit: 2023-07-01 | Discharge: 2023-07-01 | Disposition: A | Discharge status: Home / Self Care | Attending: Family Medicine | Admitting: Family Medicine

## 2023-07-01 ENCOUNTER — Encounter: Payer: Self-pay | Admitting: Emergency Medicine

## 2023-07-01 DIAGNOSIS — L0232 Furuncle of buttock: Secondary | ICD-10-CM | POA: Diagnosis not present

## 2023-07-01 MED ORDER — DOXYCYCLINE HYCLATE 100 MG PO CAPS: 100.0000 mg | ORAL_CAPSULE | Freq: Two times a day (BID) | ORAL | 0 refills | Status: DC

## 2023-07-01 NOTE — Discharge Instructions (Signed)
 Stop by the pharmacy to pick up your prescriptions.  Follow up with your primary care provider or return to the urgent care, if not improving.

## 2023-07-01 NOTE — ED Triage Notes (Signed)
 Patient states that while she was taking a shower this morning she noticed an abscess below her right buttock and another one between her buttocks.  Patient denies any drainage.  Patient denies fevers.

## 2023-07-01 NOTE — ED Provider Notes (Signed)
 [facility]    CSN: [id number] Arrival date & time: 07/01/23  2841      History   Chief Complaint Chief Complaint  Patient presents with   Abscess    HPI Joan Graves is a 53 y.o. female.   HPI  Joan Graves presents for right buttock lesions that she noticed this morning while she is in the shower.  She is a diabetic and she is concerned that they may be becoming infected. One is near her "butt crack" and the other is at the bottom of her butt cheek.  Has history of similar lesions before they were treated with antibiotics.  Past Medical History:  Diagnosis Date   Anemia    Anxiety    Aortic valve stenosis    Arthritis    Asthma    Bradycardia    Cancer (HCC)    endometrial cancer   Chronic pulmonary hypertension (HCC)    Depression    Diabetes mellitus without complication (HCC)    Dysrhythmia    Endometrial cancer (HCC)    GERD (gastroesophageal reflux disease)    Heart murmur    History of radiation therapy    Endometrial- HDR- 06/13/22-07/11/22- Dr. Retta Caster   Hypercholesteremia    Hypertension    Paroxysmal atrial fibrillation (HCC)    Paroxysmal atrial fibrillation [facility])    Sleep apnea     Patient Active Problem List   Diagnosis Date Noted   Lymphedema 04/15/2023   Cellulitis of nose 03/26/2022   Endometrial cancer (HCC) 03/26/2022   Diabetes mellitus without complication (HCC) 03/26/2022   Morbid obesity with BMI of 50.0-59.9, adult (HCC) 03/26/2022   HLD (hyperlipidemia) 03/26/2022   Depression with anxiety 03/26/2022   Abscess of nose 03/26/2022   Family history of ovarian cancer 02/03/2022   PMB (postmenopausal bleeding) 02/03/2022   Esophageal dysphagia    Problems with swallowing and mastication    Paroxysmal atrial fibrillation (HCC) 12/09/2020   Chronic pulmonary hypertension (HCC) 06/11/2020   Moderate aortic valve stenosis 06/11/2020   Benign essential hypertension 12/02/2014   Hyperlipidemia, mixed 12/11/2013    Obstructive sleep apnea 12/11/2013   Asthma 12/09/2013   Diabetes (HCC) 12/09/2013   Gastroesophageal reflux disease 12/09/2013    Past Surgical History:  Procedure Laterality Date   bladder stimulator Right 09/2022   placed by right buttocks   CARDIOVERSION N/A 12/30/2020   Procedure: CARDIOVERSION;  Surgeon: Michelle Aid, MD;  Location: [facility] ORS;  Service: Cardiovascular;  Laterality: N/A;   CARDIOVERSION N/A 08/24/2022   Procedure: CARDIOVERSION;  Surgeon: Antonette Batters, MD;  Location: [facility] ORS;  Service: Cardiovascular;  Laterality: N/A;   CATARACT EXTRACTION Right 11/2021   CATARACT EXTRACTION Left 12/2021   COLONOSCOPY     COLONOSCOPY WITH PROPOFOL  N/A 08/18/2022   Procedure: COLONOSCOPY WITH PROPOFOL ;  Surgeon: Quintin Buckle, DO;  Location: [facility] ENDOSCOPY;  Service: Gastroenterology;  Laterality: N/A;  IDDM   ESOPHAGOGASTRODUODENOSCOPY (EGD) WITH PROPOFOL  N/A 06/29/2021   Procedure: ESOPHAGOGASTRODUODENOSCOPY (EGD) WITH PROPOFOL ;  Surgeon: Marnee Sink, MD;  Location: [facility] ENDOSCOPY;  Service: Endoscopy;  Laterality: N/A;   INTERSTIM IMPLANT PLACEMENT N/A 10/18/2022   Procedure: Simona Dublin IMPLANT SECOND STAGE;  Surgeon: Erman Hayward, MD;  Location: [facility];  Service: Urology;  Laterality: N/A;  NEEDS TO BE INTUBATED WITH NO MUSCLE RELAXANT   NEEDS 75 MINS FOR THIS CASE   INTERSTIM IMPLANT PLACEMENT N/A 10/18/2022   Procedure: Simona Dublin IMPLANT FIRST STAGE;  Surgeon: Erman Hayward, MD;  Location: [facility];  Service: Urology;  Laterality: N/A;   POLYPECTOMY  08/18/2022   Procedure: POLYPECTOMY;  Surgeon: Quintin Buckle, DO;  Location: [facility] ENDOSCOPY;  Service: Gastroenterology;;   RIGHT HEART CATH Right 05/08/2023   Procedure: RIGHT HEART CATH;  Surgeon: Antonette Batters, MD;  Location: [facility] INVASIVE CV LAB;  Service: Cardiovascular;  Laterality: Right;   ROBOTIC ASSISTED TOTAL HYSTERECTOMY WITH BILATERAL SALPINGO OOPHERECTOMY Bilateral 04/12/2022    Procedure: XI ROBOTIC ASSISTED TOTAL HYSTERECTOMY WITH BILATERAL SALPINGO OOPHORECTOMY;  Surgeon: Derrel Flies, MD;  Location: [facility];  Service: Gynecology;  Laterality: Bilateral;   SENTINEL NODE BIOPSY N/A 04/12/2022   Procedure: SENTINEL NODE INJECTION;  Surgeon: Derrel Flies, MD;  Location: [facility];  Service: Gynecology;  Laterality: N/A;   TOTAL VAGINAL HYSTERECTOMY  03/2022   TUBAL LIGATION  2000    OB History     Gravida  1   Para  1   Term  1   Preterm      AB      Living  1      SAB      IAB      Ectopic      Multiple      Live Births  1            Home Medications    Prior to Admission medications   Medication Sig Start Date End Date Taking? Authorizing Provider  doxycycline  (VIBRAMYCIN ) 100 MG capsule Take 1 capsule (100 mg total) by mouth 2 (two) times daily. 07/01/23  Yes Raneen Jaffer, DO  ACCU-CHEK SOFTCLIX LANCETS lancets CHECK GLUCOSE TWICE DIALY FOR GLUCOSE MONITORING E11.9 06/09/17   [provider]  ADVAIR DISKUS 500-50 MCG/DOSE AEPB Inhale 2 puffs into the lungs daily. 04/24/17   [provider]  amiodarone (PACERONE) 200 MG tablet Take 400 mg by mouth daily.    [provider]  amLODipine  (NORVASC ) 10 MG tablet Take 1 tablet by mouth daily. 10/17/18   [provider]  apixaban  (ELIQUIS ) 5 MG TABS tablet Take 10 mg by mouth daily.    [provider]  atorvastatin  (LIPITOR) 20 MG tablet Take 20 mg by mouth daily. 10/04/19   [provider]  buPROPion  (WELLBUTRIN  SR) 100 MG 12 hr tablet Take 200 mg by mouth daily. 08/10/20   [provider]  enalapril  (VASOTEC ) 20 MG tablet Take 20 mg by mouth daily. 02/13/20   [provider]  ferrous sulfate 325 (65 FE) MG EC tablet Take 650 mg by mouth daily with breakfast. 11/30/15   [provider]  furosemide (LASIX) 20 MG tablet Take 20 mg by mouth daily.    [provider]  glipiZIDE (GLUCOTROL XL) 10 MG 24 hr  tablet Take 10 mg by mouth daily. 06/21/22   [provider]  glucose blood test strip USE AS DIRECTED TWICE A DAY 07/08/15   [provider]  JARDIANCE 25 MG TABS tablet Take 25 mg by mouth daily. 07/16/20   [provider]  loratadine (CLARITIN) 10 MG tablet Take 10 mg by mouth daily.    [provider]  metFORMIN (GLUCOPHAGE-XR) 500 MG 24 hr tablet Take 2,000 mg by mouth daily. 05/26/17   [provider]  metoprolol  tartrate (LOPRESSOR ) 25 MG tablet Take 37.5 mg by mouth daily. 0.5 tab BID per pt. 03/25/22   [provider]  naproxen sodium (ALEVE) 220 MG tablet Take 220 mg by mouth daily as needed (headaches).    [provider]  omeprazole (  PRILOSEC) 40 MG capsule Take 40 mg by mouth daily. 12/15/21   [provider]  OZEMPIC, 0.25 OR 0.5 MG/DOSE, 2 MG/3ML SOPN  04/11/23   [provider]    Family History Family History  Problem Relation Age of Onset   Ovarian cancer Mother        mid 27s   Lung cancer Mother 56   Throat cancer Maternal Grandmother    Colon cancer Neg Hx    Breast cancer Neg Hx    Endometrial cancer Neg Hx    Pancreatic cancer Neg Hx    Prostate cancer Neg Hx     Social History Social History   Tobacco Use   Smoking status: Former    Types: Cigarettes    Passive exposure: Past   Smokeless tobacco: Never  Vaping Use   Vaping status: Some Days   Substances: Nicotine, Flavoring  Substance Use Topics   Alcohol use: Yes    Alcohol/week: 2.0 standard drinks of alcohol    Types: 1 Glasses of wine, 1 Shots of liquor per week    Comment: occ   Drug use: Never     Allergies   Aspirin   Review of Systems Review of Systems :negative unless otherwise stated in HPI.      Physical Exam Triage Vital Signs ED Triage Vitals  Encounter Vitals Group     BP 07/01/23 1006 130/83     Systolic BP Percentile --      Diastolic BP Percentile --      Pulse Rate 07/01/23 1006 68     Resp  07/01/23 1006 14     Temp 07/01/23 1006 98.3 F (36.8 C)     Temp Source 07/01/23 1006 Oral     SpO2 07/01/23 1006 96 %     Weight 07/01/23 1004 (!) 313 lb 0.9 oz (142 kg)     Height 07/01/23 1004 5\' 3"  (1.6 m)     Head Circumference --      Peak Flow --      Pain Score 07/01/23 1004 4     Pain Loc --      Pain Education --      Exclude from Growth Chart --    No data found.  Updated Vital Signs BP 130/83 (BP Location: Left Arm)   Pulse 68   Temp 98.3 F (36.8 C) (Oral)   Resp 14   Ht 5\' 3"  (1.6 m)   Wt (!) 142 kg   LMP 10/06/2017 (Approximate) Comment: Pt did not have period for approx 4 years then started periods again this spring, irregular. neg preg test  SpO2 96%   BMI 55.45 kg/m   Visual Acuity Right Eye Distance:   Left Eye Distance:   Bilateral Distance:    Right Eye Near:   Left Eye Near:    Bilateral Near:     Physical Exam  GEN: alert, well appearing female, in no acute distress  RESP: no increased work of breathing NEURO: alert, moves all extremities appropriately SKIN: warm and dry; off from the gluteal cleft to the right erythematous indurated boil with right lower buttock similar lesion, both are tender to palpation, no active drainage     UC Treatments / Results  Labs (all labs ordered are listed, but only abnormal results are displayed) Labs Reviewed - No data to display  EKG   Radiology No results found.  Procedures Procedures (including critical care time)  Medications Ordered in UC Medications -  No data to display  Initial Impression / Assessment and Plan / UC Course  I have reviewed the triage vital signs and the nursing notes.  Pertinent labs & imaging results that were available during my care of the patient were reviewed by me and considered in my medical decision making (see chart for details).     Patient is a 53 y.o. female who presents for rash on right buttock .  Overall, patient is well-appearing and well-hydrated.   Vital signs stable.  Joan Graves is afebrile.  Exam concerning for boils.  Treat with antibiotics as below.  Pt may have hidradenitis suppurativa.    Reviewed expectations regarding course of current medical issues.  All questions asked were answered.  Outlined signs and symptoms indicating need for more acute intervention. Patient verbalized understanding. After Visit Summary given.   Final Clinical Impressions(s) / UC Diagnoses   Final diagnoses:  Boil of buttock     Discharge Instructions      Stop by the pharmacy to pick up your prescriptions.  Follow up with your primary care provider or return to the urgent care, if not improving.       ED Prescriptions     Medication Sig Dispense Auth. Provider   doxycycline  (VIBRAMYCIN ) 100 MG capsule Take 1 capsule (100 mg total) by mouth 2 (two) times daily. 14 capsule Stacey Maura, DO      PDMP not reviewed this encounter.              Aaden Buckman, DO 07/01/23 1031

## 2023-07-10 ENCOUNTER — Telehealth: Payer: Self-pay | Admitting: *Deleted

## 2023-07-10 NOTE — Telephone Encounter (Signed)
 Spoke with the patient and moved appt from 6/23 to 6/30

## 2023-07-18 ENCOUNTER — Encounter (INDEPENDENT_AMBULATORY_CARE_PROVIDER_SITE_OTHER): Payer: Self-pay

## 2023-07-31 ENCOUNTER — Ambulatory Visit
Admission: EM | Admit: 2023-07-31 | Discharge: 2023-07-31 | Disposition: A | Discharge status: Home / Self Care | Attending: Emergency Medicine | Admitting: Emergency Medicine

## 2023-07-31 DIAGNOSIS — L02412 Cutaneous abscess of left axilla: Secondary | ICD-10-CM | POA: Diagnosis not present

## 2023-07-31 DIAGNOSIS — L02411 Cutaneous abscess of right axilla: Secondary | ICD-10-CM

## 2023-07-31 MED ORDER — CHLORHEXIDINE GLUCONATE 4 % EX SOLN: Freq: Every day | CUTANEOUS | 0 refills | Status: DC | PRN

## 2023-07-31 MED ORDER — DOXYCYCLINE HYCLATE 100 MG PO CAPS: 100.0000 mg | ORAL_CAPSULE | Freq: Two times a day (BID) | ORAL | 0 refills | Status: AC

## 2023-07-31 NOTE — Discharge Instructions (Signed)
 Finish the doxycycline , even if you feel better.  Hibiclens  will help keep the area clean.  Warm compresses.  I have put in a referral to dermatology as this is a recurrent issue.  I am concerned that you have a condition called hidradenitis suppurativa.  Call them to arrange an appointment.  Return to the ER if you get worse, have a persistent fever >100.4, or for any concerns.   Go to www.goodrx.com  or www.costplusdrugs.com to look up your medications. This will give you a list of where you can find your prescriptions at the most affordable prices. Or ask the pharmacist what the cash price is, or if they have any other discount programs available to help make your medication more affordable. This can be less expensive than what you would pay with insurance.

## 2023-07-31 NOTE — ED Triage Notes (Signed)
 Pt c/o abscesses under bilateral axillas x3 days. States area red & hard. Denies any drainage.

## 2023-07-31 NOTE — ED Provider Notes (Signed)
 HPI  SUBJECTIVE:  Joan Graves is a 53 y.o. female who presents with 3 days of painful erythematous masses that are getting larger in her bilateral axilla.  No fevers, bodies, or drainage.  She denies trauma to the area.  She tried a homeopathic boil drawing salve and has been taking it 1000 mg of Tylenol  as needed.  Last dose was within 6 hours of evaluation.  No alleviating factors.  Symptoms are worse with palpation, lying on her side and with arm movement. She has a past medical history of chronic pulmonary hypertension, diabetes, GERD, hypercholesterolemia, hypertension, paroxysmal atrial fibrillation on Eliquis , chronic kidney disease stage III, endometrial cancer, asthma, murmur, recurrent abscesses, MRSA.  Has not been diagnosed with hidradenitis suppurativa.  She has been seen here 5 times since January with abscesses all of which have responded well to doxycycline . [facility]: [facility]   Past Medical History:  Diagnosis Date   Anemia    Anxiety    Aortic valve stenosis    Arthritis    Asthma    Bradycardia    Cancer (HCC)    endometrial cancer   Chronic pulmonary hypertension (HCC)    Depression    Diabetes mellitus without complication (HCC)    Dysrhythmia    Endometrial cancer (HCC)    GERD (gastroesophageal reflux disease)    Heart murmur    History of radiation therapy    Endometrial- HDR- 06/13/22-07/11/22- Dr. Retta Caster   Hypercholesteremia    Hypertension    Paroxysmal atrial fibrillation (HCC)    Paroxysmal atrial fibrillation [facility])    Sleep apnea     Past Surgical History:  Procedure Laterality Date   bladder stimulator Right 09/2022   placed by right buttocks   CARDIOVERSION N/A 12/30/2020   Procedure: CARDIOVERSION;  Surgeon: Michelle Aid, MD;  Location: [facility] ORS;  Service: Cardiovascular;  Laterality: N/A;   CARDIOVERSION N/A 08/24/2022   Procedure: CARDIOVERSION;  Surgeon: Antonette Batters, MD;  Location: [facility] ORS;  Service:  Cardiovascular;  Laterality: N/A;   CATARACT EXTRACTION Right 11/2021   CATARACT EXTRACTION Left 12/2021   COLONOSCOPY     COLONOSCOPY WITH PROPOFOL  N/A 08/18/2022   Procedure: COLONOSCOPY WITH PROPOFOL ;  Surgeon: Quintin Buckle, DO;  Location: [facility] ENDOSCOPY;  Service: Gastroenterology;  Laterality: N/A;  IDDM   ESOPHAGOGASTRODUODENOSCOPY (EGD) WITH PROPOFOL  N/A 06/29/2021   Procedure: ESOPHAGOGASTRODUODENOSCOPY (EGD) WITH PROPOFOL ;  Surgeon: Marnee Sink, MD;  Location: [facility] ENDOSCOPY;  Service: Endoscopy;  Laterality: N/A;   INTERSTIM IMPLANT PLACEMENT N/A 10/18/2022   Procedure: Simona Dublin IMPLANT SECOND STAGE;  Surgeon: Erman Hayward, MD;  Location: [facility];  Service: Urology;  Laterality: N/A;  NEEDS TO BE INTUBATED WITH NO MUSCLE RELAXANT   NEEDS 75 MINS FOR THIS CASE   INTERSTIM IMPLANT PLACEMENT N/A 10/18/2022   Procedure: Simona Dublin IMPLANT FIRST STAGE;  Surgeon: Erman Hayward, MD;  Location: [facility];  Service: Urology;  Laterality: N/A;   POLYPECTOMY  08/18/2022   Procedure: POLYPECTOMY;  Surgeon: Quintin Buckle, DO;  Location: [facility] ENDOSCOPY;  Service: Gastroenterology;;   RIGHT HEART CATH Right 05/08/2023   Procedure: RIGHT HEART CATH;  Surgeon: Antonette Batters, MD;  Location: [facility] INVASIVE CV LAB;  Service: Cardiovascular;  Laterality: Right;   ROBOTIC ASSISTED TOTAL HYSTERECTOMY WITH BILATERAL SALPINGO OOPHERECTOMY Bilateral 04/12/2022   Procedure: XI ROBOTIC ASSISTED TOTAL HYSTERECTOMY WITH BILATERAL SALPINGO OOPHORECTOMY;  Surgeon: Derrel Flies, MD;  Location: [facility];  Service: Gynecology;  Laterality: Bilateral;   SENTINEL NODE BIOPSY  N/A 04/12/2022   Procedure: SENTINEL NODE INJECTION;  Surgeon: Derrel Flies, MD;  Location: [facility];  Service: Gynecology;  Laterality: N/A;   TOTAL VAGINAL HYSTERECTOMY  03/2022   TUBAL LIGATION  2000    Family History  Problem Relation Age of Onset   Ovarian cancer Mother        mid 77s   Lung cancer Mother 2    Throat cancer Maternal Grandmother    Colon cancer Neg Hx    Breast cancer Neg Hx    Endometrial cancer Neg Hx    Pancreatic cancer Neg Hx    Prostate cancer Neg Hx     Social History   Tobacco Use   Smoking status: Former    Types: Cigarettes    Passive exposure: Past   Smokeless tobacco: Never  Vaping Use   Vaping status: Some Days   Substances: Nicotine, Flavoring  Substance Use Topics   Alcohol use: Yes    Alcohol/week: 2.0 standard drinks of alcohol    Types: 1 Glasses of wine, 1 Shots of liquor per week    Comment: occ   Drug use: Never    No current facility-administered medications for this encounter.  Current Outpatient Medications:    ACCU-CHEK SOFTCLIX LANCETS lancets, CHECK GLUCOSE TWICE DIALY FOR GLUCOSE MONITORING E11.9, Disp: , Rfl: 5   ADVAIR DISKUS 500-50 MCG/DOSE AEPB, Inhale 2 puffs into the lungs daily., Disp: , Rfl: 5   amiodarone (PACERONE) 200 MG tablet, Take 400 mg by mouth daily., Disp: , Rfl:    amLODipine  (NORVASC ) 10 MG tablet, Take 1 tablet by mouth daily., Disp: , Rfl:    apixaban  (ELIQUIS ) 5 MG TABS tablet, Take 10 mg by mouth daily., Disp: , Rfl:    atorvastatin  (LIPITOR) 20 MG tablet, Take 20 mg by mouth daily., Disp: , Rfl:    buPROPion  (WELLBUTRIN  SR) 100 MG 12 hr tablet, Take 200 mg by mouth daily., Disp: , Rfl:    chlorhexidine  (HIBICLENS ) 4 % external liquid, Apply topically daily as needed., Disp: 120 mL, Rfl: 0   doxycycline  (VIBRAMYCIN ) 100 MG capsule, Take 1 capsule (100 mg total) by mouth 2 (two) times daily for 10 days., Disp: 20 capsule, Rfl: 0   enalapril  (VASOTEC ) 20 MG tablet, Take 20 mg by mouth daily., Disp: , Rfl:    ferrous sulfate 325 (65 FE) MG EC tablet, Take 650 mg by mouth daily with breakfast., Disp: , Rfl:    furosemide (LASIX) 20 MG tablet, Take 20 mg by mouth daily., Disp: , Rfl:    glipiZIDE (GLUCOTROL XL) 10 MG 24 hr tablet, Take 10 mg by mouth daily., Disp: , Rfl:    glucose blood test strip, USE AS DIRECTED  TWICE A DAY, Disp: , Rfl:    JARDIANCE 25 MG TABS tablet, Take 25 mg by mouth daily., Disp: , Rfl:    loratadine (CLARITIN) 10 MG tablet, Take 10 mg by mouth daily., Disp: , Rfl:    metFORMIN (GLUCOPHAGE-XR) 500 MG 24 hr tablet, Take 2,000 mg by mouth daily., Disp: , Rfl: 1   metoprolol  tartrate (LOPRESSOR ) 25 MG tablet, Take 37.5 mg by mouth daily. 0.5 tab BID per pt., Disp: , Rfl:    omeprazole (PRILOSEC) 40 MG capsule, Take 40 mg by mouth daily., Disp: , Rfl:    OZEMPIC, 0.25 OR 0.5 MG/DOSE, 2 MG/3ML SOPN, , Disp: , Rfl:   Allergies  Allergen Reactions   Aspirin Diarrhea and Other (See Comments)    "high doses"  gives her diarrhea  "high doses" gives her diarrhea "high doses" gives her diarrhea    "high doses" gives her diarrhea     ROS  As noted in HPI.   Physical Exam  BP (!) 118/55 (BP Location: Left Arm)   Pulse 64   Temp 97.8 F (36.6 C) (Oral)   Resp 16   Ht 5\' 3"  (1.6 m)   Wt (!) 142 kg   LMP 10/06/2017 (Approximate) Comment: Pt did not have period for approx 4 years then started periods again this spring, irregular. neg preg test  SpO2 100%   BMI 55.45 kg/m   Constitutional: Well developed, well nourished, no acute distress Eyes:  EOMI, conjunctiva normal bilaterally HENT: Normocephalic, atraumatic,mucus membranes moist Respiratory: Normal inspiratory effort Cardiovascular: Normal rate GI: nondistended skin: 4 x 2 cm tender area of induration with 2 erythematous masses, 0.5 x 0.5 cm tender erythematous mass left axilla.  No central fluctuance.  No expressible purulent drainage.   1 by 2 cm tender area of erythema, edema, induration with no expressible purulent drainage right axilla  Musculoskeletal: no deformities Neurologic: Alert & oriented x 3, no focal neuro deficits Psychiatric: Speech and behavior appropriate   ED Course   Medications - No data to display  Orders Placed This Encounter  Procedures   Ambulatory referral to Dermatology     Referral Priority:   Routine    Referral Type:   Consultation    Referral Reason:   Specialty Services Required    Requested Specialty:   Dermatology    Number of Visits Requested:   1    No results found for this or any previous visit (from the past 24 hours). No results found.  ED Clinical Impression  1. Abscess of left axilla   2. Abscess of axilla, right      ED Assessment/Plan     Previous records reviewed.  As noted in HPI.  Patient presents with recurrent abscess/boils.  There is nothing to I&D at this time.  States that she has always gotten better with doxycycline .  Home with 10 days of doxycycline  100 mg p.o. twice daily, Hibiclens , warm compresses.  Given the frequency of this, I am concerned about hidradenitis suppurativa, so we will place referral to dermatology.  Discussed MDM, treatment plan, and plan for follow-up with patient. Discussed sn/sx that should prompt return to the ED. patient agrees with plan.   Meds ordered this encounter  Medications   doxycycline  (VIBRAMYCIN ) 100 MG capsule    Sig: Take 1 capsule (100 mg total) by mouth 2 (two) times daily for 10 days.    Dispense:  20 capsule    Refill:  0   chlorhexidine  (HIBICLENS ) 4 % external liquid    Sig: Apply topically daily as needed.    Dispense:  120 mL    Refill:  0      *This clinic note was created using Scientist, clinical (histocompatibility and immunogenetics). Therefore, there may be occasional mistakes despite careful proofreading.  ?    Ethlyn Herd, MD 08/02/23 1440

## 2023-08-07 ENCOUNTER — Encounter (HOSPITAL_COMMUNITY): Payer: Self-pay

## 2023-08-07 ENCOUNTER — Ambulatory Visit (HOSPITAL_COMMUNITY)
Admission: RE | Admit: 2023-08-07 | Discharge: 2023-08-07 | Disposition: A | Discharge status: Home / Self Care | Source: Ambulatory Visit | Attending: Psychiatry | Admitting: Psychiatry

## 2023-08-07 DIAGNOSIS — C541 Malignant neoplasm of endometrium: Secondary | ICD-10-CM | POA: Insufficient documentation

## 2023-08-07 DIAGNOSIS — F439 Reaction to severe stress, unspecified: Secondary | ICD-10-CM | POA: Insufficient documentation

## 2023-08-07 LAB — POCT I-STAT CREATININE: Creatinine, Ser: 1.1 mg/dL — ABNORMAL HIGH (ref 0.44–1.00)

## 2023-08-07 MED ORDER — IOHEXOL 9 MG/ML PO SOLN
ORAL | Status: AC
Start: 2023-08-07 — End: ?
  Filled 2023-08-07: qty 1000

## 2023-08-07 MED ORDER — IOHEXOL 9 MG/ML PO SOLN
1000.0000 mL | ORAL | Status: AC
  Administered 2023-08-07: 1000 mL via ORAL

## 2023-08-07 MED ORDER — IOHEXOL 300 MG/ML  SOLN
100.0000 mL | Freq: Once | INTRAMUSCULAR | Status: AC | PRN
  Administered 2023-08-07: 100 mL via INTRAVENOUS

## 2023-08-07 MED ORDER — SODIUM CHLORIDE (PF) 0.9 % IJ SOLN
INTRAMUSCULAR | Status: AC
  Filled 2023-08-07: qty 50

## 2023-08-08 ENCOUNTER — Ambulatory Visit: Payer: Self-pay | Admitting: Psychiatry

## 2023-08-21 ENCOUNTER — Ambulatory Visit: Admitting: Psychiatry

## 2023-08-28 ENCOUNTER — Telehealth: Payer: Self-pay | Admitting: *Deleted

## 2023-08-28 ENCOUNTER — Inpatient Hospital Stay: Admitting: Psychiatry

## 2023-08-28 ENCOUNTER — Telehealth: Payer: Self-pay

## 2023-08-28 DIAGNOSIS — C541 Malignant neoplasm of endometrium: Secondary | ICD-10-CM

## 2023-08-28 NOTE — Telephone Encounter (Signed)
 Joan Graves called office to reschedule appointment with Dr.Newton from today.   Pt is scheduled on 7/21 @ 4:00. Pt agreed to date/time

## 2023-08-28 NOTE — Progress Notes (Unsigned)
 This encounter was created in error - please disregard.

## 2023-08-28 NOTE — Telephone Encounter (Signed)
 Joan Graves called this afternoon at 1:05 stating she would not be able to show up for her 1:30 appointment until 2:00 or 2:30 due to transportation issue. Per Dr.Newton, d/t seeing patients every 15 minutes, pt will have to reschedule.   Pt aware and declined to reschedule stating I am  coming anyway and Dr.Newton will have to see me before you close today After several attempts of getting her to reschedule the call was ended with her saying she was coming this afternoon.   Per Joan Epps NP, name given to front desk as an FYI to notify Joan Graves or security  if she tries to be seen.  Appointment was cancelled

## 2023-08-28 NOTE — Telephone Encounter (Signed)
 Spoke with patient to relay message that the office has rescheduled her follow up appt. With Dr. Eldonna for Monday, July 21 st at 4pm. Pt verbalized understanding and thanked the office for calling.

## 2023-09-11 ENCOUNTER — Telehealth: Payer: Self-pay

## 2023-09-11 NOTE — Telephone Encounter (Signed)
 Spoke with patient and moved appointment from 7/21 at 4:00pm to 7/21 at 3:30 due to provider having a meeting at 4pm.. patient confirmed

## 2023-09-18 ENCOUNTER — Inpatient Hospital Stay: Attending: Psychiatry | Admitting: Psychiatry

## 2023-09-18 ENCOUNTER — Encounter: Payer: Self-pay | Admitting: Psychiatry

## 2023-09-18 VITALS — BP 141/53 | HR 65 | Temp 97.6°F | Resp 20 | Wt 294.2 lb

## 2023-09-18 DIAGNOSIS — Z90710 Acquired absence of both cervix and uterus: Secondary | ICD-10-CM | POA: Diagnosis not present

## 2023-09-18 DIAGNOSIS — Z8542 Personal history of malignant neoplasm of other parts of uterus: Secondary | ICD-10-CM | POA: Diagnosis not present

## 2023-09-18 DIAGNOSIS — C541 Malignant neoplasm of endometrium: Secondary | ICD-10-CM

## 2023-09-18 DIAGNOSIS — Z90722 Acquired absence of ovaries, bilateral: Secondary | ICD-10-CM | POA: Insufficient documentation

## 2023-09-18 NOTE — Patient Instructions (Signed)
It was a pleasure to see you in clinic today. - Normal exam today - Return visit planned for 3 months  Thank you very much for allowing me to provide care for you today.  I appreciate your confidence in choosing our Gynecologic Oncology team at [facility].  If you have any questions about your visit today please call our office or send [place] a MyChart message and we will get back to you as soon as possible.

## 2023-09-18 NOTE — Progress Notes (Signed)
 Gynecologic Oncology Return Clinic Visit  Date of Service: 09/18/2023 Referring Provider: Harland Birkenhead, MD   Assessment & Plan: Joan Graves is a 53 y.o. woman with incompletely staged Stage IB (FIGO 2023 staging) FIGO grade 2 endometrioid endometrial cancer (no LVSI, 64% MI, p53wt, MMRp, POLE neg), s/p RA-TLH, BSO on 04/12/22 and completed VBT 07/11/22. Presents today for surveillance.  Endometrial cancer: - Pink area at central vaginal cuff improved. Pt previously declined biopsy. Most likely was granulation given improvement. - CT A/p from 08/07/23 NED.  - Signs/symptoms of recurrence reviewed. If bleeding, should return sooner. - Continue surveillance with follow-up q52mo initially with Gyn Onc. Pt prefers to defer Rad Onc follow-up unless particular need. After 1 year NED, can consider spacing to q36mo for surveillance (pt prefers this).  - Given unable to surgically assess lymph nodes, recommend repeat CT q59mo for first 2 years for surveillance. After that may space to yearly. Will order at next visit.  - Reviewed that after 5 years NED, will be safe to return to Ob/Gyn. - POLE testing negative. - Pt declined genetics referral.   RTC 3 months  Hoy Masters, MD Gynecologic Oncology  Medical Decision Making I personally spent  TOTAL 20 minutes face-to-face and non-face-to-face in the care of this patient, which includes all pre, intra, and post visit time on the date of service.   ----------------------- Reason for Visit: Surveillance  Treatment History: Oncology History  Endometrial cancer (HCC)  02/15/2022 Initial Biopsy   Endometrial biopsy: FIGO grade 2 endometrioid adenocarcinoma   03/26/2022 Initial Diagnosis   Endometrial cancer (HCC)   04/12/2022 Surgery   Robotic-assisted total laparoscopic hysterectomy, bilateral salpingo-oophorectomy, bilateral sentinel lymph node evaluation (no biopsy due to morbid obesity and inability to visualize lymph channels/sentinel  lymph node)   04/12/2022 Pathology Results   A. UTERUS WITH RIGHT AND LEFT FALLOPIAN TUBE AND OVARY, HYSTERECTOMY AND  BILATERAL SALPINGO-OOPHORECTOMY:  Invasive well to moderately differentiated endometrioid adenocarcinoma  with squamous morular metaplasia, FIGO 2  Tumor invades greater than 50% of the myometrium within the lower  uterine segment (7 of 11 mm, 64%) (pT1b)  Margins free  Bilateral acute and chronic salpingitis, proximally  Benign ovaries   An immunohistochemical stain for p53 performed on the biopsy  ([id number]) exhibited a wild-type staining pattern.   IHC EXPRESSION RESULTS  TEST           RESULT  MLH1:          Preserved nuclear expression  MSH2:          Preserved nuclear expression  MSH6:          Preserved nuclear expression  PMS2:          Preserved nuclear expression    04/12/2022 Cancer Staging   Staging form: Corpus Uteri - Carcinoma and Carcinosarcoma, AJCC 8th Edition - Pathologic stage from 04/12/2022: FIGO Stage IB, calculated as Stage Unknown (pT1b, pNX, cM0) - Signed by Masters Hoy, MD on 03/26/2023 Histopathologic type: Endometrioid adenocarcinoma, NOS Stage prefix: Initial diagnosis Histologic grade (G): G2 Histologic grading system: 3 grade system Lymph-vascular invasion (LVI): LVI not present (absent)/not identified     Interval History: Overall unchanged.  Stable chest pain, shortness of breath.  Reports that she sees the heart doctor next month and pulmonology in October. No new vaginal bleeding, abdominal/pelvic pain, unintentional weight loss, change in bowel or bladder habits, early satiety, bloating, nausea/vomiting.     Past Medical/Surgical History: Past Medical History:  Diagnosis Date  Anemia    Anxiety    Aortic valve stenosis    Arthritis    Asthma    Bradycardia    Cancer (HCC)    endometrial cancer   Chronic pulmonary hypertension (HCC)    Depression    Diabetes mellitus without complication (HCC)    Dysrhythmia     Endometrial cancer (HCC)    GERD (gastroesophageal reflux disease)    Heart murmur    History of radiation therapy    Endometrial- HDR- 06/13/22-07/11/22- Dr. Lynwood Nasuti   Hypercholesteremia    Hypertension    Paroxysmal atrial fibrillation (HCC)    Paroxysmal atrial fibrillation [facility])    Sleep apnea     Past Surgical History:  Procedure Laterality Date   bladder stimulator Right 09/2022   placed by right buttocks   CARDIOVERSION N/A 12/30/2020   Procedure: CARDIOVERSION;  Surgeon: Hester Wolm PARAS, MD;  Location: [facility] ORS;  Service: Cardiovascular;  Laterality: N/A;   CARDIOVERSION N/A 08/24/2022   Procedure: CARDIOVERSION;  Surgeon: Florencio Cara BIRCH, MD;  Location: [facility] ORS;  Service: Cardiovascular;  Laterality: N/A;   CATARACT EXTRACTION Right 11/2021   CATARACT EXTRACTION Left 12/2021   COLONOSCOPY     COLONOSCOPY WITH PROPOFOL  N/A 08/18/2022   Procedure: COLONOSCOPY WITH PROPOFOL ;  Surgeon: Onita Elspeth Sharper, DO;  Location: [facility] ENDOSCOPY;  Service: Gastroenterology;  Laterality: N/A;  IDDM   ESOPHAGOGASTRODUODENOSCOPY (EGD) WITH PROPOFOL  N/A 06/29/2021   Procedure: ESOPHAGOGASTRODUODENOSCOPY (EGD) WITH PROPOFOL ;  Surgeon: Jinny Carmine, MD;  Location: [facility] ENDOSCOPY;  Service: Endoscopy;  Laterality: N/A;   INTERSTIM IMPLANT PLACEMENT N/A 10/18/2022   Procedure: RENNA IMPLANT SECOND STAGE;  Surgeon: Gaston Hamilton, MD;  Location: [facility];  Service: Urology;  Laterality: N/A;  NEEDS TO BE INTUBATED WITH NO MUSCLE RELAXANT   NEEDS 75 MINS FOR THIS CASE   INTERSTIM IMPLANT PLACEMENT N/A 10/18/2022   Procedure: RENNA IMPLANT FIRST STAGE;  Surgeon: Gaston Hamilton, MD;  Location: [facility]S;  Service: Urology;  Laterality: N/A;   POLYPECTOMY  08/18/2022   Procedure: POLYPECTOMY;  Surgeon: Onita Elspeth Sharper, DO;  Location: [facility] ENDOSCOPY;  Service: Gastroenterology;;   RIGHT HEART CATH Right 05/08/2023   Procedure: RIGHT HEART CATH;  Surgeon: Florencio Cara BIRCH, MD;  Location: [facility] INVASIVE CV LAB;  Service: Cardiovascular;  Laterality: Right;   ROBOTIC ASSISTED TOTAL HYSTERECTOMY WITH BILATERAL SALPINGO OOPHERECTOMY Bilateral 04/12/2022   Procedure: XI ROBOTIC ASSISTED TOTAL HYSTERECTOMY WITH BILATERAL SALPINGO OOPHORECTOMY;  Surgeon: Eldonna Mays, MD;  Location: [facility];  Service: Gynecology;  Laterality: Bilateral;   SENTINEL NODE BIOPSY N/A 04/12/2022   Procedure: SENTINEL NODE INJECTION;  Surgeon: Eldonna Mays, MD;  Location: [facility];  Service: Gynecology;  Laterality: N/A;   TOTAL VAGINAL HYSTERECTOMY  03/2022   TUBAL LIGATION  2000    Family History  Problem Relation Age of Onset   Ovarian cancer Mother        mid 5s   Lung cancer Mother 27   Throat cancer Maternal Grandmother    Colon cancer Neg Hx    Breast cancer Neg Hx    Endometrial cancer Neg Hx    Pancreatic cancer Neg Hx    Prostate cancer Neg Hx     Social History   Socioeconomic History   Marital status: Married    Spouse name: Jerilynn   Number of children: 1   Years of education: Not on file   Highest education level: Not on file  Occupational History   Not on file  Tobacco Use   Smoking status: Former    Types: Cigarettes    Passive exposure: Past   Smokeless tobacco: Never  Vaping Use   Vaping status: Some Days   Substances: Nicotine, Flavoring  Substance and Sexual Activity   Alcohol use: Yes    Alcohol/week: 2.0 standard drinks of alcohol    Types: 1 Glasses of wine, 1 Shots of liquor per week    Comment: occ   Drug use: Never   Sexual activity: Yes    Birth control/protection: Surgical    Comment: Tubal Ligation  Other Topics Concern   Not on file  Social History Narrative   Lives with husband, Jerilynn.  Also lives with ex-husband. Daughter & son in law currently lives with pt.    Social Drivers of Corporate investment banker Strain: Low Risk  (04/06/2023)   Received from [facility]   Overall Financial  Resource Strain (CARDIA)    Difficulty of Paying Living Expenses: Not hard at all  Food Insecurity: No Food Insecurity (04/06/2023)   Received from [facility]   Hunger Vital Sign    Within the past 12 months, you worried that your food would run out before you got the money to buy more.: Never true    Within the past 12 months, the food you bought just didn't last and you didn't have money to get more.: Never true  Recent Concern: Food Insecurity - Food Insecurity Present (04/01/2023)   Received from [facility]   Hunger Vital Sign    Worried About Running Out of Food in the Last Year: Sometimes true    Ran Out of Food in the Last Year: Sometimes true  Transportation Needs: No Transportation Needs (04/06/2023)   Received from [facility] - Transportation    In the past 12 months, has lack of transportation kept you from medical appointments or from getting medications?: No    Lack of Transportation (Non-Medical): No  Physical Activity: Not on file  Stress: Not on file  Social Connections: Not on file    Current Medications:  Current Outpatient Medications:    ACCU-CHEK SOFTCLIX LANCETS lancets, CHECK GLUCOSE TWICE DIALY FOR GLUCOSE MONITORING E11.9, Disp: , Rfl: 5   ADVAIR DISKUS 500-50 MCG/DOSE AEPB, Inhale 2 puffs into the lungs daily., Disp: , Rfl: 5   amiodarone (PACERONE) 200 MG tablet, Take 400 mg by mouth daily., Disp: , Rfl:    amLODipine  (NORVASC ) 10 MG tablet, Take 1 tablet by mouth daily., Disp: , Rfl:    apixaban  (ELIQUIS ) 5 MG TABS tablet, Take 10 mg by mouth daily., Disp: , Rfl:    atorvastatin  (LIPITOR) 20 MG tablet, Take 20 mg by mouth daily., Disp: , Rfl:    buPROPion  (WELLBUTRIN  SR) 100 MG 12 hr tablet, Take 200 mg by mouth daily., Disp: , Rfl:    chlorhexidine  (HIBICLENS ) 4 % external liquid, Apply topically daily as needed., Disp: 120 mL, Rfl: 0   enalapril  (VASOTEC ) 20 MG tablet, Take 20 mg by mouth  daily., Disp: , Rfl:    ferrous sulfate 325 (65 FE) MG EC tablet, Take 650 mg by mouth daily with breakfast., Disp: , Rfl:    furosemide (LASIX) 20 MG tablet, Take 20 mg by mouth daily., Disp: , Rfl:    glipiZIDE (GLUCOTROL XL) 10 MG 24 hr tablet, Take 10 mg by mouth daily., Disp: , Rfl:    glucose blood test strip, USE  AS DIRECTED TWICE A DAY, Disp: , Rfl:    JARDIANCE 25 MG TABS tablet, Take 25 mg by mouth daily., Disp: , Rfl:    loratadine (CLARITIN) 10 MG tablet, Take 10 mg by mouth daily., Disp: , Rfl:    metFORMIN (GLUCOPHAGE-XR) 500 MG 24 hr tablet, Take 2,000 mg by mouth daily., Disp: , Rfl: 1   metoprolol  tartrate (LOPRESSOR ) 25 MG tablet, Take 37.5 mg by mouth daily. 0.5 tab BID per pt., Disp: , Rfl:    omeprazole (PRILOSEC) 40 MG capsule, Take 40 mg by mouth daily., Disp: , Rfl:    OZEMPIC, 0.25 OR 0.5 MG/DOSE, 2 MG/3ML SOPN, , Disp: , Rfl:   Review of Symptoms: Complete 10-system review is positive for: shortness of breath, joint pain  Physical Exam: BP (!) 141/53 (BP Location: Left Arm, Patient Position: Sitting)   Pulse 65   Temp 97.6 F (36.4 C) (Oral)   Resp 20   Wt 294 lb 3.2 oz (133.4 kg)   LMP 10/06/2017 (Approximate) Comment: Pt did not have period for approx 4 years then started periods again this spring, irregular. neg preg test  SpO2 100%   BMI 52.12 kg/m  General: Alert, oriented, no acute distress. HEENT: Normocephalic, atraumatic. Neck symmetric without masses. Sclera anicteric.  Chest: Normal work of breathing. Clear to auscultation bilaterally.   Cardiovascular: Regular rate and rhythm, no murmurs. Abdomen: Soft, nontender.  Normoactive bowel sounds.   Well healed incisions.  Extremities: Grossly normal range of motion.  Warm, well perfused. Chronic skin changes over both lower extremities Skin: Erythema in a skin fold of right thigh. resolution of erythema of groins, vulva Lymphatics: No cervical, supraclavicular, or inguinal lymphadenopathy GU: Normal  external genitalia.  Speculum exam with cuff with improvement in pink area at central cuff.  Bimanual exam with smooth vaginal cuff of what can be reached, no nodularity, no pelvic mass, exam limited by body habitus. Exam chaperoned by Eleanor Epps, NP   Laboratory & Radiologic Studies: CT ABDOMEN PELVIS W CONTRAST 08/07/2023  Narrative EXAMINATION: CT ABDOMEN PELVIS W CONTRAST  CLINICAL INDICATION: Female, 53 years old. Genital cancer, monitor  TECHNIQUE: Axial CT of the abdomen and pelvis with 100 cc Omnipaque  300 intravenous contrast. Multiplanar reformations provided. Unless otherwise specified, incidental thyroid , adrenal, renal lesions do not require dedicated imaging follow up. Additionally, any mentioned pulmonary nodules do not require dedicated imaging follow-up based on the Fleischner guidelines unless otherwise specified. Coronary calcifications are not identified unless otherwise specified.  COMPARISON: 02/13/2023  FINDINGS:  The lung bases are clear. The heart is normal in size. The liver appears normal. Gallbladder is normal. The spleen is normal. Pancreas is normal. The adrenals are normal. The kidneys are normal. The abdominal aorta is normal in caliber. The urinary bladder is normal. The uterus is surgically absent. The appendix is normal. Large and small bowel loops are otherwise within normal limits. No free fluid or adenopathy. Sacral stimulator noted.  There are degenerative changes of the spine and bony pelvis.  IMPRESSION:  Status post hysterectomy with no evidence for malignancy within the abdomen or pelvis.  DOSE REDUCTION: This exam was performed according to our departmental dose-optimization program which includes automated exposure control, adjustment of the mA and/or kV according to patient size and/or use of iterative reconstruction technique.  Electronically signed by: [place] Engel MD 08/07/2023 06:00 PM EDT RP Workstation: [id number]

## 2023-10-09 ENCOUNTER — Other Ambulatory Visit: Payer: Self-pay | Admitting: Physician Assistant

## 2023-10-09 DIAGNOSIS — Z1231 Encounter for screening mammogram for malignant neoplasm of breast: Secondary | ICD-10-CM

## 2023-10-16 ENCOUNTER — Encounter (INDEPENDENT_AMBULATORY_CARE_PROVIDER_SITE_OTHER): Payer: Self-pay | Admitting: Vascular Surgery

## 2023-10-16 ENCOUNTER — Ambulatory Visit (INDEPENDENT_AMBULATORY_CARE_PROVIDER_SITE_OTHER): Payer: 59 | Admitting: Vascular Surgery

## 2023-10-16 VITALS — BP 108/70 | HR 62 | Ht 63.0 in | Wt 290.4 lb

## 2023-10-16 DIAGNOSIS — I10 Essential (primary) hypertension: Secondary | ICD-10-CM

## 2023-10-16 DIAGNOSIS — E1121 Type 2 diabetes mellitus with diabetic nephropathy: Secondary | ICD-10-CM

## 2023-10-16 DIAGNOSIS — K219 Gastro-esophageal reflux disease without esophagitis: Secondary | ICD-10-CM

## 2023-10-16 DIAGNOSIS — I890 Lymphedema, not elsewhere classified: Secondary | ICD-10-CM

## 2023-10-16 NOTE — Progress Notes (Unsigned)
 MRN : [id number]  Joan Graves is a 53 y.o. (1971/01/13) female who presents with chief complaint of legs swell.  History of Present Illness:   The patient returns to the office for followup evaluation regarding leg swelling.  The swelling has persisted but with the lymph pump is under much, much better controlled. The pain associated with swelling is decreased. There have not been any interval development of a ulcerations or wounds.  The patient denies problems with the pump, noting it is working well and the leggings are in good condition.  Since the previous visit the patient has been wearing graduated compression stockings and using the lymph pump on a routine basis and  has noted significant improvement in the lymphedema.   Patient stated the lymph pump has been helpful with the treatment of the lymphedema.   No outpatient medications have been marked as taking for the 10/16/23 encounter (Appointment) with Jama, Cordella MATSU, MD.    Past Medical History:  Diagnosis Date   Anemia    Anxiety    Aortic valve stenosis    Arthritis    Asthma    Bradycardia    Cancer (HCC)    endometrial cancer   Chronic pulmonary hypertension (HCC)    Depression    Diabetes mellitus without complication (HCC)    Dysrhythmia    Endometrial cancer (HCC)    GERD (gastroesophageal reflux disease)    Heart murmur    History of radiation therapy    Endometrial- HDR- 06/13/22-07/11/22- Dr. Lynwood Nasuti   Hypercholesteremia    Hypertension    Paroxysmal atrial fibrillation (HCC)    Paroxysmal atrial fibrillation [facility])    Sleep apnea     Past Surgical History:  Procedure Laterality Date   bladder stimulator Right 09/2022   placed by right buttocks   CARDIOVERSION N/A 12/30/2020   Procedure: CARDIOVERSION;  Surgeon: Hester Wolm PARAS, MD;  Location: [facility] ORS;  Service: Cardiovascular;  Laterality: N/A;   CARDIOVERSION N/A 08/24/2022   Procedure:  CARDIOVERSION;  Surgeon: Florencio Cara BIRCH, MD;  Location: [facility] ORS;  Service: Cardiovascular;  Laterality: N/A;   CATARACT EXTRACTION Right 11/2021   CATARACT EXTRACTION Left 12/2021   COLONOSCOPY     COLONOSCOPY WITH PROPOFOL  N/A 08/18/2022   Procedure: COLONOSCOPY WITH PROPOFOL ;  Surgeon: Onita Elspeth Sharper, DO;  Location: [facility] ENDOSCOPY;  Service: Gastroenterology;  Laterality: N/A;  IDDM   ESOPHAGOGASTRODUODENOSCOPY (EGD) WITH PROPOFOL  N/A 06/29/2021   Procedure: ESOPHAGOGASTRODUODENOSCOPY (EGD) WITH PROPOFOL ;  Surgeon: Jinny Carmine, MD;  Location: [facility] ENDOSCOPY;  Service: Endoscopy;  Laterality: N/A;   INTERSTIM IMPLANT PLACEMENT N/A 10/18/2022   Procedure: RENNA IMPLANT SECOND STAGE;  Surgeon: Gaston Hamilton, MD;  Location: [facility];  Service: Urology;  Laterality: N/A;  NEEDS TO BE INTUBATED WITH NO MUSCLE RELAXANT   NEEDS 75 MINS FOR THIS CASE   INTERSTIM IMPLANT PLACEMENT N/A 10/18/2022   Procedure: RENNA IMPLANT FIRST STAGE;  Surgeon: Gaston Hamilton, MD;  Location: [facility];  Service: Urology;  Laterality: N/A;   POLYPECTOMY  08/18/2022   Procedure: POLYPECTOMY;  Surgeon: Onita Elspeth Sharper, DO;  Location: [facility] ENDOSCOPY;  Service: Gastroenterology;;   RIGHT HEART CATH Right 05/08/2023   Procedure: RIGHT HEART CATH;  Surgeon: Florencio Cara BIRCH, MD;  Location: [facility] INVASIVE CV LAB;  Service: Cardiovascular;  Laterality: Right;   ROBOTIC  ASSISTED TOTAL HYSTERECTOMY WITH BILATERAL SALPINGO OOPHERECTOMY Bilateral 04/12/2022   Procedure: XI ROBOTIC ASSISTED TOTAL HYSTERECTOMY WITH BILATERAL SALPINGO OOPHORECTOMY;  Surgeon: Eldonna Mays, MD;  Location: [facility];  Service: Gynecology;  Laterality: Bilateral;   SENTINEL NODE BIOPSY N/A 04/12/2022   Procedure: SENTINEL NODE INJECTION;  Surgeon: Eldonna Mays, MD;  Location: [facility];  Service: Gynecology;  Laterality: N/A;   TOTAL VAGINAL HYSTERECTOMY  03/2022   TUBAL LIGATION  2000    Social History Social History    Tobacco Use   Smoking status: Former    Types: Cigarettes    Passive exposure: Past   Smokeless tobacco: Never  Vaping Use   Vaping status: Some Days   Substances: Nicotine, Flavoring  Substance Use Topics   Alcohol use: Yes    Alcohol/week: 2.0 standard drinks of alcohol    Types: 1 Glasses of wine, 1 Shots of liquor per week    Comment: occ   Drug use: Never    Family History Family History  Problem Relation Age of Onset   Ovarian cancer Mother        mid 58s   Lung cancer Mother 67   Throat cancer Maternal Grandmother    Colon cancer Neg Hx    Breast cancer Neg Hx    Endometrial cancer Neg Hx    Pancreatic cancer Neg Hx    Prostate cancer Neg Hx     Allergies  Allergen Reactions   Aspirin Diarrhea and Other (See Comments)    high doses gives her diarrhea  high doses gives her diarrhea high doses gives her diarrhea    high doses gives her diarrhea     REVIEW OF SYSTEMS (Negative unless checked)  Constitutional: [] Weight loss  [] Fever  [] Chills Cardiac: [] Chest pain   [] Chest pressure   [] Palpitations   [] Shortness of breath when laying flat   [] Shortness of breath with exertion. Vascular:  [] Pain in legs with walking   [x] Pain in legs with standing  [] History of DVT   [] Phlebitis   [x] Swelling in legs   [] Varicose veins   [] Non-healing ulcers Pulmonary:   [] Uses home oxygen   [] Productive cough   [] Hemoptysis   [] Wheeze  [] COPD   [] Asthma Neurologic:  [] Dizziness   [] Seizures   [] History of stroke   [] History of TIA  [] Aphasia   [] Vissual changes   [] Weakness or numbness in arm   [] Weakness or numbness in leg Musculoskeletal:   [] Joint swelling   [] Joint pain   [] Low back pain Hematologic:  [] Easy bruising  [] Easy bleeding   [] Hypercoagulable state   [] Anemic Gastrointestinal:  [] Diarrhea   [] Vomiting  [] Gastroesophageal reflux/heartburn   [] Difficulty swallowing. Genitourinary:  [] Chronic kidney disease   [] Difficult urination  [] Frequent urination    [] Blood in urine Skin:  [] Rashes   [] Ulcers  Psychological:  [] History of anxiety   []  History of major depression.  Physical Examination  There were no vitals filed for this visit. There is no height or weight on file to calculate BMI. Gen: WD/WN, NAD Head: [place]/AT, No temporalis wasting.  Ear/Nose/Throat: Hearing grossly intact, nares w/o erythema or drainage, pinna without lesions Eyes: PER, EOMI, sclera nonicteric.  Neck: Supple, no gross masses.  No JVD.  Pulmonary:  Good air movement, no audible wheezing, no use of accessory muscles.  Cardiac: RRR, precordium not hyperdynamic. Vascular:  scattered varicosities present bilaterally.  Mild venous stasis changes to the legs bilaterally.  3-4+ soft pitting edema, CEAP C4sEpAsPr  Vessel Right Left  Radial Palpable Palpable  Gastrointestinal: soft, non-distended. No guarding/no peritoneal signs.  Musculoskeletal: M/S 5/5 throughout.  No deformity.  Neurologic: CN 2-12 intact. Pain and light touch intact in extremities.  Symmetrical.  Speech is fluent. Motor exam as listed above. Psychiatric: Judgment intact, Mood & affect appropriate for pt's clinical situation. Dermatologic: Venous rashes no ulcers noted.  No changes consistent with cellulitis. Lymph : No lichenification or skin changes of chronic lymphedema.  CBC Lab Results  Component Value Date   WBC 4.0 11/07/2022   HGB 11.6 (L) 05/08/2023   HCT 34.0 (L) 05/08/2023   MCV 93.0 11/07/2022   PLT 213 11/07/2022    BMET    Component Value Date/Time   NA 141 05/08/2023 1122   K 4.2 05/08/2023 1122   CL 99 11/07/2022 1005   CO2 24 11/07/2022 1005   GLUCOSE 212 (H) 11/07/2022 1005   BUN 23 (H) 11/07/2022 1005   CREATININE 1.10 (H) 08/07/2023 1035   CREATININE 0.69 03/02/2022 1215   CALCIUM  8.8 (L) 11/07/2022 1005   GFRNONAA >60 11/07/2022 1005   GFRNONAA >60 03/02/2022 1215   CrCl cannot be calculated (Patient's most recent lab result is older than the maximum 21 days  allowed.).  COAG Lab Results  Component Value Date   INR 1.4 (H) 08/30/2022   INR 1.2 05/30/2022    Radiology No results found.   Assessment/Plan There are no diagnoses linked to this encounter.   Cordella Shawl, MD  10/16/2023 12:53 PM

## 2023-10-18 ENCOUNTER — Encounter (INDEPENDENT_AMBULATORY_CARE_PROVIDER_SITE_OTHER): Payer: Self-pay | Admitting: Vascular Surgery

## 2023-10-26 ENCOUNTER — Ambulatory Visit
Admission: RE | Admit: 2023-10-26 | Discharge: 2023-10-26 | Disposition: A | Discharge status: Home / Self Care | Source: Ambulatory Visit | Attending: Physician Assistant | Admitting: Physician Assistant

## 2023-10-26 DIAGNOSIS — Z1231 Encounter for screening mammogram for malignant neoplasm of breast: Secondary | ICD-10-CM | POA: Diagnosis present

## 2023-12-25 ENCOUNTER — Inpatient Hospital Stay: Attending: Psychiatry | Admitting: Psychiatry

## 2023-12-25 ENCOUNTER — Encounter: Payer: Self-pay | Admitting: Psychiatry

## 2023-12-25 VITALS — BP 122/68 | HR 75 | Temp 97.4°F | Resp 20 | Wt 279.4 lb

## 2023-12-25 DIAGNOSIS — Z90710 Acquired absence of both cervix and uterus: Secondary | ICD-10-CM | POA: Diagnosis not present

## 2023-12-25 DIAGNOSIS — Z7985 Long-term (current) use of injectable non-insulin antidiabetic drugs: Secondary | ICD-10-CM | POA: Diagnosis not present

## 2023-12-25 DIAGNOSIS — R112 Nausea with vomiting, unspecified: Secondary | ICD-10-CM | POA: Diagnosis not present

## 2023-12-25 DIAGNOSIS — Z9079 Acquired absence of other genital organ(s): Secondary | ICD-10-CM | POA: Diagnosis not present

## 2023-12-25 DIAGNOSIS — E119 Type 2 diabetes mellitus without complications: Secondary | ICD-10-CM | POA: Diagnosis not present

## 2023-12-25 DIAGNOSIS — C541 Malignant neoplasm of endometrium: Secondary | ICD-10-CM

## 2023-12-25 DIAGNOSIS — Z08 Encounter for follow-up examination after completed treatment for malignant neoplasm: Secondary | ICD-10-CM | POA: Insufficient documentation

## 2023-12-25 DIAGNOSIS — Z8542 Personal history of malignant neoplasm of other parts of uterus: Secondary | ICD-10-CM | POA: Diagnosis present

## 2023-12-25 DIAGNOSIS — Z90722 Acquired absence of ovaries, bilateral: Secondary | ICD-10-CM | POA: Diagnosis not present

## 2023-12-25 DIAGNOSIS — Z6841 Body Mass Index (BMI) 40.0 and over, adult: Secondary | ICD-10-CM | POA: Insufficient documentation

## 2023-12-25 DIAGNOSIS — Z923 Personal history of irradiation: Secondary | ICD-10-CM | POA: Insufficient documentation

## 2023-12-25 NOTE — Patient Instructions (Signed)
 It was a pleasure to see you in clinic today. - Normal exam today - CT in 3 months - Return visit planned for 3 months - You can discuss with your doctor prescribing the ozempic the nausea and vomiting episodes you have experienced.   Thank you very much for allowing me to provide care for you today.  I appreciate your confidence in choosing our Gynecologic Oncology team at [facility].  If you have any questions about your visit today please call our office or send us  a MyChart message and we will get back to you as soon as possible.

## 2023-12-25 NOTE — Progress Notes (Signed)
 Gynecologic Oncology Return Clinic Visit  Date of Service: 12/25/2023 Referring Provider: Harland Birkenhead, MD   Assessment & Plan: Joan Graves is a 53 y.o. woman with incompletely staged Stage IB (FIGO 2023 staging) FIGO grade 2 endometrioid endometrial cancer (no LVSI, 64% MI, p53wt, MMRp, POLE neg), s/p RA-TLH, BSO on 04/12/22 and completed VBT 07/11/22. Presents today for surveillance.  Endometrial cancer: - Pink area at central vaginal cuff improved resolved (likely prior granulation tissue). Pt declined prior biopsy.  - CT A/p from 08/07/23 NED.  - Given unable to surgically assess lymph nodes, recommend repeat CT q74mo for first 2 years for surveillance. After that may space to yearly. Next scan ordered today.  - Signs/symptoms of recurrence reviewed. If bleeding, should return sooner. - Continue surveillance with follow-up q41mo initially with Gyn Onc. Pt prefers to defer Rad Onc follow-up unless particular need. Pt would like to space out appointments.  Plan for follow-up in 3 months.  If scan is NED, then can follow-up in 4 months which we will get her to 2 years.  Then can space out to every 6 months. - Reviewed that after 5 years NED, will be safe to return to Ob/Gyn. - POLE testing negative. - Pt declined genetics referral.   Nausea/vomiting: - Reports that this has been on and off since February. - CT in June without any findings to suggest cause of this. - Was started on Ozempic around same time as the start of her symptoms.  Suspect this is related to her Ozempic.  Recommend that she discuss further with the provider prescribing the Ozempic.  Morbid obesity (BMI 49.5): - 15lb weight loss since last visit - On ozempic, continue   RTC 3 months  Hoy Masters, MD Gynecologic Oncology  Medical Decision Making I personally spent  TOTAL 20 minutes face-to-face and non-face-to-face in the care of this patient, which includes all pre, intra, and post visit time on the date  of service.   ----------------------- Reason for Visit: Surveillance  Treatment History: Oncology History  Endometrial cancer (HCC)  02/15/2022 Initial Biopsy   Endometrial biopsy: FIGO grade 2 endometrioid adenocarcinoma   03/26/2022 Initial Diagnosis   Endometrial cancer (HCC)   04/12/2022 Surgery   Robotic-assisted total laparoscopic hysterectomy, bilateral salpingo-oophorectomy, bilateral sentinel lymph node evaluation (no biopsy due to morbid obesity and inability to visualize lymph channels/sentinel lymph node)   04/12/2022 Pathology Results   A. UTERUS WITH RIGHT AND LEFT FALLOPIAN TUBE AND OVARY, HYSTERECTOMY AND  BILATERAL SALPINGO-OOPHORECTOMY:  Invasive well to moderately differentiated endometrioid adenocarcinoma  with squamous morular metaplasia, FIGO 2  Tumor invades greater than 50% of the myometrium within the lower  uterine segment (7 of 11 mm, 64%) (pT1b)  Margins free  Bilateral acute and chronic salpingitis, proximally  Benign ovaries   An immunohistochemical stain for p53 performed on the biopsy  ([id number]) exhibited a wild-type staining pattern.   IHC EXPRESSION RESULTS  TEST           RESULT  MLH1:          Preserved nuclear expression  MSH2:          Preserved nuclear expression  MSH6:          Preserved nuclear expression  PMS2:          Preserved nuclear expression    04/12/2022 Cancer Staging   Staging form: Corpus Uteri - Carcinoma and Carcinosarcoma, AJCC 8th Edition - Pathologic stage from 04/12/2022: FIGO Stage IB, calculated as Stage  Unknown (pT1b, pNX, cM0) - Signed by Eldonna Mays, MD on 03/26/2023 Histopathologic type: Endometrioid adenocarcinoma, NOS Stage prefix: Initial diagnosis Histologic grade (G): G2 Histologic grading system: 3 grade system Lymph-vascular invasion (LVI): LVI not present (absent)/not identified     Interval History: Patient reports that she has noticed occasional hot flashes over the past year.  Also reports  episodes of nausea and vomiting since February.  They happen sometimes about twice a week but may skip a week.  When she has between episodes she tolerates p.o. without issue.  Over the same timeframe she has started on Ozempic and has lost 15 pounds since her last visit with me.  Has not discussed her nausea or vomiting with the provider prescribing the Ozempic.  Otherwise denies any new vaginal bleeding, abdominal/pelvic pain, change in bowel or bladder habits, bloating.    Past Medical/Surgical History: Past Medical History:  Diagnosis Date   Anemia    Anxiety    Aortic valve stenosis    Arthritis    Asthma    Bradycardia    Cancer (HCC)    endometrial cancer   Chronic pulmonary hypertension (HCC)    Depression    Diabetes mellitus without complication (HCC)    Dysrhythmia    Endometrial cancer (HCC)    GERD (gastroesophageal reflux disease)    Heart murmur    History of radiation therapy    Endometrial- HDR- 06/13/22-07/11/22- Dr. Lynwood Nasuti   Hypercholesteremia    Hypertension    Paroxysmal atrial fibrillation (HCC)    Paroxysmal atrial fibrillation [facility])    Sleep apnea     Past Surgical History:  Procedure Laterality Date   bladder stimulator Right 09/2022   placed by right buttocks   CARDIOVERSION N/A 12/30/2020   Procedure: CARDIOVERSION;  Surgeon: Hester Wolm PARAS, MD;  Location: [facility] ORS;  Service: Cardiovascular;  Laterality: N/A;   CARDIOVERSION N/A 08/24/2022   Procedure: CARDIOVERSION;  Surgeon: Florencio Cara BIRCH, MD;  Location: [facility] ORS;  Service: Cardiovascular;  Laterality: N/A;   CATARACT EXTRACTION Right 11/2021   CATARACT EXTRACTION Left 12/2021   COLONOSCOPY     COLONOSCOPY WITH PROPOFOL  N/A 08/18/2022   Procedure: COLONOSCOPY WITH PROPOFOL ;  Surgeon: Onita Elspeth Sharper, DO;  Location: [facility] ENDOSCOPY;  Service: Gastroenterology;  Laterality: N/A;  IDDM   ESOPHAGOGASTRODUODENOSCOPY (EGD) WITH PROPOFOL  N/A 06/29/2021   Procedure:  ESOPHAGOGASTRODUODENOSCOPY (EGD) WITH PROPOFOL ;  Surgeon: Jinny Carmine, MD;  Location: [facility] ENDOSCOPY;  Service: Endoscopy;  Laterality: N/A;   INTERSTIM IMPLANT PLACEMENT N/A 10/18/2022   Procedure: RENNA IMPLANT SECOND STAGE;  Surgeon: Gaston Hamilton, MD;  Location: [facility];  Service: Urology;  Laterality: N/A;  NEEDS TO BE INTUBATED WITH NO MUSCLE RELAXANT   NEEDS 75 MINS FOR THIS CASE   INTERSTIM IMPLANT PLACEMENT N/A 10/18/2022   Procedure: RENNA IMPLANT FIRST STAGE;  Surgeon: Gaston Hamilton, MD;  Location: [facility];  Service: Urology;  Laterality: N/A;   POLYPECTOMY  08/18/2022   Procedure: POLYPECTOMY;  Surgeon: Onita Elspeth Sharper, DO;  Location: [facility] ENDOSCOPY;  Service: Gastroenterology;;   RIGHT HEART CATH Right 05/08/2023   Procedure: RIGHT HEART CATH;  Surgeon: Florencio Cara BIRCH, MD;  Location: [facility] INVASIVE CV LAB;  Service: Cardiovascular;  Laterality: Right;   ROBOTIC ASSISTED TOTAL HYSTERECTOMY WITH BILATERAL SALPINGO OOPHERECTOMY Bilateral 04/12/2022   Procedure: XI ROBOTIC ASSISTED TOTAL HYSTERECTOMY WITH BILATERAL SALPINGO OOPHORECTOMY;  Surgeon: Eldonna Mays, MD;  Location: [facility];  Service: Gynecology;  Laterality: Bilateral;   SENTINEL NODE BIOPSY N/A 04/12/2022  Procedure: SENTINEL NODE INJECTION;  Surgeon: Eldonna Mays, MD;  Location: [facility];  Service: Gynecology;  Laterality: N/A;   TOTAL VAGINAL HYSTERECTOMY  03/2022   TUBAL LIGATION  2000    Family History  Problem Relation Age of Onset   Ovarian cancer Mother        mid 3s   Lung cancer Mother 41   Throat cancer Maternal Grandmother    Colon cancer Neg Hx    Breast cancer Neg Hx    Endometrial cancer Neg Hx    Pancreatic cancer Neg Hx    Prostate cancer Neg Hx     Social History   Socioeconomic History   Marital status: Married    Spouse name: Lawrence   Number of children: 1   Years of education: Not on file   Highest education level: Not on file  Occupational History   Not  on file  Tobacco Use   Smoking status: Former    Types: Cigarettes    Passive exposure: Past   Smokeless tobacco: Never  Vaping Use   Vaping status: Some Days   Substances: Nicotine, Flavoring  Substance and Sexual Activity   Alcohol use: Yes    Alcohol/week: 2.0 standard drinks of alcohol    Types: 1 Glasses of wine, 1 Shots of liquor per week    Comment: occ   Drug use: Never   Sexual activity: Yes    Birth control/protection: Surgical    Comment: Tubal Ligation  Other Topics Concern   Not on file  Social History Narrative   Lives with husband, Jerilynn.  Also lives with ex-husband. Daughter & son in law currently lives with pt.    Social Drivers of Corporate Investment Banker Strain: Low Risk  (04/06/2023)   Received [facility]   Overall Financial Resource Strain (CARDIA)    Difficulty of Paying Living Expenses: Not hard at all  Food Insecurity: No Food Insecurity (04/06/2023)   Received from [facility]   Hunger Vital Sign    Within the past 12 months, you worried that your food would run out before you got the money to buy more.: Never true    Within the past 12 months, the food you bought just didn't last and you didn't have money to get more.: Never true  Recent Concern: Food Insecurity - Food Insecurity Present (04/01/2023)   Received from [facility]   Hunger Vital Sign    Worried About Running Out of Food in the Last Year: Sometimes true    Ran Out of Food in the Last Year: Sometimes true  Transportation Needs: No Transportation Needs (04/06/2023)   Received [facility]    In the past 12 months, has lack of transportation kept you from medical appointments or from getting medications?: No    Lack of Transportation (Non-Medical): No  Physical Activity: Not on file  Stress: Not on file  Social Connections: Not on file    Current Medications:  Current  Outpatient Medications:    ADVAIR DISKUS 500-50 MCG/DOSE AEPB, Inhale 2 puffs into the lungs daily., Disp: , Rfl: 5   amiodarone (PACERONE) 200 MG tablet, Take 400 mg by mouth daily., Disp: , Rfl:    amLODipine  (NORVASC ) 10 MG tablet, Take 1 tablet by mouth daily., Disp: , Rfl:    apixaban  (ELIQUIS ) 5 MG TABS tablet, Take 10 mg by mouth daily., Disp: , Rfl:  atorvastatin  (LIPITOR) 20 MG tablet, Take 20 mg by mouth daily., Disp: , Rfl:    Blood Glucose Monitoring Suppl (CONTOUR MONITOR) DEVI, CONTOUR MONITOR DEVI, Disp: , Rfl:    Blood Glucose Monitoring Suppl (CONTOUR NEXT GEN MONITOR) w/Device KIT, SMARTSIG:1 Unit(s) As Directed, Disp: , Rfl:    buPROPion  (WELLBUTRIN  SR) 100 MG 12 hr tablet, Take 200 mg by mouth daily., Disp: , Rfl:    enalapril  (VASOTEC ) 20 MG tablet, Take 20 mg by mouth daily., Disp: , Rfl:    ferrous sulfate 325 (65 FE) MG EC tablet, Take 650 mg by mouth daily with breakfast., Disp: , Rfl:    furosemide (LASIX) 20 MG tablet, Take 20 mg by mouth daily., Disp: , Rfl:    glipiZIDE (GLUCOTROL XL) 10 MG 24 hr tablet, Take 10 mg by mouth daily., Disp: , Rfl:    JARDIANCE 25 MG TABS tablet, Take 25 mg by mouth daily., Disp: , Rfl:    loratadine (CLARITIN) 10 MG tablet, Take 10 mg by mouth daily., Disp: , Rfl:    metFORMIN (GLUCOPHAGE-XR) 500 MG 24 hr tablet, Take 2,000 mg by mouth daily., Disp: , Rfl: 1   metoprolol  tartrate (LOPRESSOR ) 25 MG tablet, Take 37.5 mg by mouth daily. 0.5 tab BID per pt., Disp: , Rfl:    omeprazole (PRILOSEC) 40 MG capsule, Take 40 mg by mouth daily., Disp: , Rfl:    OZEMPIC, 0.25 OR 0.5 MG/DOSE, 2 MG/3ML SOPN, , Disp: , Rfl:   Review of Symptoms: Complete 10-system review is positive for: Hot flashes, nausea and vomiting, anxiety, depression  Physical Exam: BP (!) 114/47 (BP Location: Left Arm, Patient Position: Sitting)   Pulse 75   Temp (!) 97.4 F (36.3 C) (Oral)   Resp 20   Wt 279 lb 6.4 oz (126.7 kg)   LMP 10/06/2017 (Approximate)  Comment: Pt did not have period for approx 4 years then started periods again this spring, irregular. neg preg test  SpO2 100%   BMI 49.49 kg/m  General: Alert, oriented, no acute distress. HEENT: Normocephalic, atraumatic. Neck symmetric without masses. Sclera anicteric.  Chest: Normal work of breathing. Clear to auscultation bilaterally.   Cardiovascular: Regular rate and rhythm, no murmurs. Abdomen: Soft, nontender.  Normoactive bowel sounds.   Well healed incisions.  Extremities: Grossly normal range of motion.  Warm, well perfused. Chronic skin changes over both lower extremities Skin: Erythema in a skin fold of right thigh. resolution of erythema of groins, vulva Lymphatics: No cervical, supraclavicular, or inguinal lymphadenopathy GU: Normal external genitalia.  Speculum exam with cuff with normal vaginal mucosa and cuff (erythema resolved).  Bimanual exam with smooth vaginal cuff of what can be reached, no nodularity, no pelvic mass, exam limited by body habitus. Exam chaperoned by Kimberly Jordan, CMA    Laboratory & Radiologic Studies: None

## 2024-02-14 ENCOUNTER — Ambulatory Visit: Admission: EM | Admit: 2024-02-14 | Discharge: 2024-02-14

## 2024-02-14 ENCOUNTER — Encounter: Payer: Self-pay | Admitting: Emergency Medicine

## 2024-02-14 DIAGNOSIS — L03211 Cellulitis of face: Secondary | ICD-10-CM | POA: Diagnosis not present

## 2024-02-14 NOTE — ED Provider Notes (Signed)
 [facility]    CSN: [id number] Arrival date & time: 02/14/24  1235      History   Chief Complaint Chief Complaint  Patient presents with   Oral Swelling    HPI TALITA RECHT is a 53 y.o. female.   HPI  53 year old female with past medical history significant for benign essential hypertension, chronic pulmonary hypertension, diabetes, GERD, hyperlipidemia, moderate aortic valve stenosis, obstructive sleep apnea, paroxysmal atrial fibrillation, and cellulitis of the nose presents for evaluation of lip pain and swelling that has been going on for last 2 days.  She denies any fever.  No trauma.  Past Medical History:  Diagnosis Date   Anemia    Anxiety    Aortic valve stenosis    Arthritis    Asthma    Bradycardia    Cancer (HCC)    endometrial cancer   Chronic pulmonary hypertension (HCC)    Depression    Diabetes mellitus without complication (HCC)    Dysrhythmia    Endometrial cancer (HCC)    GERD (gastroesophageal reflux disease)    Heart murmur    History of radiation therapy    Endometrial- HDR- 06/13/22-07/11/22- Dr. Lynwood Nasuti   Hypercholesteremia    Hypertension    Paroxysmal atrial fibrillation (HCC)    Paroxysmal atrial fibrillation (HCC)    Sleep apnea     Patient Active Problem List   Diagnosis Date Noted   Chronic kidney disease, stage 3 unspecified (HCC) 06/14/2023   Long term (current) use of anticoagulants 05/24/2023   Lymphedema 04/15/2023   Unilateral primary osteoarthritis, left hip 04/11/2023   Unilateral primary osteoarthritis, left knee 04/11/2023   Mitral annular calcification 01/09/2023   Adjustment disorder with anxiety 05/25/2022   Chronic diarrhea 05/03/2022   Cellulitis 04/04/2022   Cellulitis of nose 03/26/2022   Endometrial cancer (HCC) 03/26/2022   Diabetes mellitus without complication (HCC) 03/26/2022   Morbid obesity with BMI of 50.0-59.9, adult (HCC) 03/26/2022   HLD (hyperlipidemia) 03/26/2022    Depression with anxiety 03/26/2022   Abscess of nose 03/26/2022   Family history of ovarian cancer 02/03/2022   PMB (postmenopausal bleeding) 02/03/2022   Tobacco use disorder 02/01/2022   Esophageal dysphagia    Problems with swallowing and mastication    Bradycardia 01/05/2021   Paroxysmal atrial fibrillation (HCC) 12/09/2020   Chronic pulmonary hypertension (HCC) 06/11/2020   Moderate aortic valve stenosis 06/11/2020   Abnormal weight loss 05/22/2020   Urinary incontinence 01/09/2019   Cognitive disorder 03/06/2018   SOBOE (shortness of breath on exertion) 12/19/2016   Stress 10/18/2016   Status post tubal ligation 10/13/2015   Benign essential hypertension 12/02/2014   Hyperlipidemia, mixed 12/11/2013   Obstructive sleep apnea 12/11/2013   Asthma 12/09/2013   Diabetes (HCC) 12/09/2013   Chest pain, atypical 12/17/2012   Preventative health care 04/20/2012   Acid reflux disease 12/19/2011   Atopic dermatitis 08/17/2011   Mood disorder 06/24/2011   Diabetic nephropathy associated with type 2 diabetes mellitus (HCC) 06/17/2011   Allergic rhinitis 05/14/2007   Morbid obesity (HCC) 05/14/2007   Hypertension 04/21/2007    Past Surgical History:  Procedure Laterality Date   bladder stimulator Right 09/2022   placed by right buttocks   CARDIOVERSION N/A 12/30/2020   Procedure: CARDIOVERSION;  Surgeon: Hester Wolm PARAS, MD;  Location: [facility] ORS;  Service: Cardiovascular;  Laterality: N/A;   CARDIOVERSION N/A 08/24/2022   Procedure: CARDIOVERSION;  Surgeon: Florencio Cara BIRCH, MD;  Location: [facility] ORS;  Service: Cardiovascular;  Laterality: N/A;   CATARACT EXTRACTION Right 11/2021   CATARACT EXTRACTION Left 12/2021   COLONOSCOPY     COLONOSCOPY WITH PROPOFOL  N/A 08/18/2022   Procedure: COLONOSCOPY WITH PROPOFOL ;  Surgeon: Onita Elspeth Sharper, DO;  Location: [facility] ENDOSCOPY;  Service: Gastroenterology;  Laterality: N/A;  IDDM   ESOPHAGOGASTRODUODENOSCOPY (EGD) WITH PROPOFOL  N/A  06/29/2021   Procedure: ESOPHAGOGASTRODUODENOSCOPY (EGD) WITH PROPOFOL ;  Surgeon: Jinny Carmine, MD;  Location: [facility] ENDOSCOPY;  Service: Endoscopy;  Laterality: N/A;   INTERSTIM IMPLANT PLACEMENT N/A 10/18/2022   Procedure: RENNA IMPLANT SECOND STAGE;  Surgeon: Gaston Hamilton, MD;  Location: [facility];  Service: Urology;  Laterality: N/A;  NEEDS TO BE INTUBATED WITH NO MUSCLE RELAXANT   NEEDS 75 MINS FOR THIS CASE   INTERSTIM IMPLANT PLACEMENT N/A 10/18/2022   Procedure: RENNA IMPLANT FIRST STAGE;  Surgeon: Gaston Hamilton, MD;  Location: [facility];  Service: Urology;  Laterality: N/A;   POLYPECTOMY  08/18/2022   Procedure: POLYPECTOMY;  Surgeon: Onita Elspeth Sharper, DO;  Location: [facility] ENDOSCOPY;  Service: Gastroenterology;;   RIGHT HEART CATH Right 05/08/2023   Procedure: RIGHT HEART CATH;  Surgeon: Florencio Cara BIRCH, MD;  Location: [facility] INVASIVE CV LAB;  Service: Cardiovascular;  Laterality: Right;   ROBOTIC ASSISTED TOTAL HYSTERECTOMY WITH BILATERAL SALPINGO OOPHERECTOMY Bilateral 04/12/2022   Procedure: XI ROBOTIC ASSISTED TOTAL HYSTERECTOMY WITH BILATERAL SALPINGO OOPHORECTOMY;  Surgeon: Eldonna Mays, MD;  Location: [facility];  Service: Gynecology;  Laterality: Bilateral;   SENTINEL NODE BIOPSY N/A 04/12/2022   Procedure: SENTINEL NODE INJECTION;  Surgeon: Eldonna Mays, MD;  Location: [facility];  Service: Gynecology;  Laterality: N/A;   TOTAL VAGINAL HYSTERECTOMY  03/2022   TUBAL LIGATION  2000    OB History     Gravida  1   Para  1   Term  1   Preterm      AB      Living  1      SAB      IAB      Ectopic      Multiple      Live Births  1            Home Medications    Prior to Admission medications  Medication Sig Start Date End Date Taking? Authorizing Provider  ADVAIR DISKUS 500-50 MCG/DOSE AEPB Inhale 2 puffs into the lungs daily. 04/24/17   [provider]  amiodarone (PACERONE) 200 MG tablet Take 400 mg by mouth daily.     [provider]  amLODipine  (NORVASC ) 10 MG tablet Take 1 tablet by mouth daily. 10/17/18   [provider]  apixaban  (ELIQUIS ) 5 MG TABS tablet Take 10 mg by mouth daily.    [provider]  atorvastatin  (LIPITOR) 20 MG tablet Take 20 mg by mouth daily. 10/04/19   [provider]  Blood Glucose Monitoring Suppl (CONTOUR MONITOR) DEVI CONTOUR MONITOR DEVI 11/07/23   [provider]  Blood Glucose Monitoring Suppl (CONTOUR NEXT GEN MONITOR) w/Device KIT SMARTSIG:1 Unit(s) As Directed    [provider]  buPROPion  (WELLBUTRIN  SR) 100 MG 12 hr tablet Take 200 mg by mouth daily. 08/10/20   [provider]  enalapril  (VASOTEC ) 20 MG tablet Take 20 mg by mouth daily. 02/13/20   [provider]  ferrous sulfate 325 (65 FE) MG EC tablet Take 650 mg by mouth daily with breakfast. 11/30/15   [provider]  furosemide (LASIX) 20 MG tablet Take 20 mg by mouth daily.    [provider]  glipiZIDE (GLUCOTROL XL) 10 MG 24 hr tablet Take 10 mg by mouth daily. 06/21/22   [provider]  JARDIANCE 25 MG TABS tablet Take 25 mg by mouth daily. 07/16/20   [provider]  metFORMIN (GLUCOPHAGE-XR) 500 MG 24 hr tablet Take 2,000 mg by mouth daily. 05/26/17   [provider]  metoprolol  tartrate (LOPRESSOR ) 25 MG tablet Take 37.5 mg by mouth daily. 0.5 tab BID per pt. 03/25/22   [provider]  omeprazole (PRILOSEC) 40 MG capsule Take 40 mg by mouth daily. 12/15/21   [provider]  OZEMPIC, 0.25 OR 0.5 MG/DOSE, 2 MG/3ML SOPN  04/11/23   [provider]    Family History Family History  Problem Relation Age of Onset   Ovarian cancer Mother        mid 1s   Lung cancer Mother 28   Throat cancer Maternal Grandmother    Colon cancer Neg Hx    Breast cancer Neg Hx    Endometrial cancer Neg Hx    Pancreatic cancer Neg Hx    Prostate cancer Neg Hx     Social History Social  History[1]   Allergies   Aspirin   Review of Systems Review of Systems  Constitutional:  Negative for fever.  HENT:  Positive for facial swelling.   Skin:  Positive for color change and wound.     Physical Exam Triage Vital Signs ED Triage Vitals  Encounter Vitals Group     BP      Girls Systolic BP Percentile      Girls Diastolic BP Percentile      Boys Systolic BP Percentile      Boys Diastolic BP Percentile      Pulse      Resp      Temp      Temp src      SpO2      Weight      Height      Head Circumference      Peak Flow      Pain Score      Pain Loc      Pain Education      Exclude from Growth Chart    No data found.  Updated Vital Signs BP 129/81 (BP Location: Right Arm)   Pulse (!) 56   Temp 97.8 F (36.6 C) (Oral)   Resp 16   Wt 282 lb (127.9 kg)   LMP 10/06/2017 Comment: Pt did not have period for approx 4 years then started periods again this spring, irregular. neg preg test  SpO2 98%   BMI 49.95 kg/m   Visual Acuity Right Eye Distance:   Left Eye Distance:   Bilateral Distance:    Right Eye Near:   Left Eye Near:    Bilateral Near:     Physical Exam Vitals and nursing note reviewed.  Constitutional:      Appearance: Normal appearance. She is not ill-appearing.  HENT:     Head: Normocephalic and atraumatic.  Skin:    General: Skin is warm and dry.     Capillary Refill: Capillary refill takes less than 2 seconds.     Findings: Erythema present.  Neurological:     General: No focal deficit present.     Mental Status: She is alert and oriented to person, place, and time.      UC Treatments / Results  Labs (all labs ordered are listed, but only abnormal results  are displayed) Labs Reviewed - No data to display  EKG   Radiology No results found.  Procedures Procedures (including critical care time)  Medications Ordered in UC Medications - No data to display  Initial Impression / Assessment and Plan / UC Course  I  have reviewed the triage vital signs and the nursing notes.  Pertinent labs & imaging results that were available during my care of the patient were reviewed by me and considered in my medical decision making (see chart for details).   Patient is a pleasant, nontoxic-appearing 53 year old female presenting for evaluation of 2 days worth of redness and swelling to her right upper lip.  Seen in the emergency department in [place] 4 days ago and diagnosed with cellulitis of her nose and prescribed oral antibiotics and topical cream.  She has been taking the oral antibiotics but the topical cream was out of stock until yesterday.  2 days ago the redness, pain, swelling started to spread down onto her lip.  She has firm induration of the right side of her upper lip with no fluctuance suggesting abscess formation.  Given that she has been taking oral antibiotics for 4 days means that she is failed outpatient therapy and therefore needs to be evaluated in the ER for round of IV antibiotics and possible admission given that she is experiencing facial cellulitis.  She has elected to go to [facility].   Final Clinical Impressions(s) / UC Diagnoses   Final diagnoses:  Facial cellulitis     Discharge Instructions      As we discussed, you need to be evaluated in the emergency department as you will need IV antibiotics given that you have failed outpatient oral antibiotic therapy.  Please go to the ER at [facility] to be evaluated for your facial cellulitis.     ED Prescriptions   None    PDMP not reviewed this encounter.    [1]  Social History Tobacco Use   Smoking status: Former    Types: Cigarettes    Passive exposure: Past   Smokeless tobacco: Never  Vaping Use   Vaping status: Some Days   Substances: Nicotine, Flavoring  Substance Use Topics   Alcohol use: Yes    Alcohol/week: 2.0 standard drinks of alcohol    Types: 1 Glasses of wine, 1 Shots of liquor per week    Comment: occ   Drug  use: Never     Bernardino Ditch, NP 02/14/24 1418

## 2024-02-14 NOTE — ED Triage Notes (Signed)
 Pt presents with lip swelling and pain x 2 days. She has taken tylenol  for the pain.

## 2024-02-14 NOTE — Discharge Instructions (Addendum)
 As we discussed, you need to be evaluated in the emergency department as you will need IV antibiotics given that you have failed outpatient oral antibiotic therapy.  Please go to the ER at [facility] to be evaluated for your facial cellulitis.

## 2024-03-11 ENCOUNTER — Telehealth: Payer: Self-pay | Admitting: *Deleted

## 2024-03-11 ENCOUNTER — Ambulatory Visit (HOSPITAL_COMMUNITY)

## 2024-03-11 ENCOUNTER — Other Ambulatory Visit

## 2024-03-11 ENCOUNTER — Encounter (HOSPITAL_COMMUNITY): Payer: Self-pay

## 2024-03-11 NOTE — Telephone Encounter (Signed)
 Spoke with Joan Graves who called the office to cancel her appt. With Dr. Eldonna on February 2nd. Patient states this appointment was to follow up with CT scan that was scheduled today, but patient had to cancel it because she doesn't have her car. States it was impounded and she doesn't have the money to get her care with all her belongings and medication inside.   Patient states she is with her daughter, son-in-law, guinea pig and hezekiah and they will be homeless tomorrow. States they are staying in a hotel tonight.   Asked patient if she would like the office to get in touch with the social worker to arrange transportation to her medical appointments? Patient said she needs money to get her car. Explained to patient that may not be possible, but we can get her rescheduled for her CT scan and then reschedule her follow up appointment with Dr. Eldonna at a later date.  Advised patient we will reach out to the social worker and then give her a call back. Patient said, ok.

## 2024-03-12 ENCOUNTER — Inpatient Hospital Stay: Attending: Psychiatry | Admitting: Licensed Clinical Social Worker

## 2024-03-12 NOTE — Progress Notes (Signed)
 [facility] Clinical Social Work  Clinical Social Work was referred by engineer, civil (consulting) for housing, transportation, and financial concerns.  Clinical Social Worker contacted patient by phone to offer support and assess for needs.    Patient is currently in a hotel with her daughter and son-in-law since Tuesday and will no longer be able to stay there after today. They plan to sleep in daughter's car along with daughter's guinea pig and parakeet.  Pt previously had a trailer but was evicted after her family moved in without being on lease. They then found another place, but pt reports that the landlord stopped accepting payment. They went to court but were ultimately evicted.  They are in [place].  On Friday, pt's car which had her clothes and medicine in it was towed and she cannot afford to get it out of impound.   Pt is solely focused on getting her car back. CSW explored pt trying to work with insurance & pharmacy to obtain early refills of critical meds and/or connecting with [facility] housing resources to determine if they have any additional resources or can help with shelter.  Interventions: Provided patient with information about homeless/housing resources and day center for [facility] transportation information      Follow Up Plan:  Patient will contact CSW with any support or resource needs    Faria Casella E Henryetta Corriveau, LCSW  Clinical Social Worker [facility]

## 2024-04-01 ENCOUNTER — Inpatient Hospital Stay: Admitting: Psychiatry

## 2024-04-22 ENCOUNTER — Inpatient Hospital Stay: Admitting: Psychiatry

## 2024-10-14 ENCOUNTER — Ambulatory Visit (INDEPENDENT_AMBULATORY_CARE_PROVIDER_SITE_OTHER): Admitting: Vascular Surgery
